# Patient Record
Sex: Female | Born: 1965 | Race: Black or African American | Hispanic: No | Marital: Single | Smoking: Never smoker
Health system: Southern US, Community
[De-identification: ages and names within clinical notes are randomized; demographics above are authoritative.]

## PROBLEM LIST (undated history)

## (undated) DIAGNOSIS — F319 Bipolar disorder, unspecified: Secondary | ICD-10-CM

## (undated) DIAGNOSIS — N289 Disorder of kidney and ureter, unspecified: Secondary | ICD-10-CM

## (undated) DIAGNOSIS — J45909 Unspecified asthma, uncomplicated: Secondary | ICD-10-CM

## (undated) DIAGNOSIS — F419 Anxiety disorder, unspecified: Secondary | ICD-10-CM

## (undated) DIAGNOSIS — Q2112 Patent foramen ovale: Secondary | ICD-10-CM

## (undated) DIAGNOSIS — Q211 Atrial septal defect: Secondary | ICD-10-CM

## (undated) DIAGNOSIS — K219 Gastro-esophageal reflux disease without esophagitis: Secondary | ICD-10-CM

## (undated) DIAGNOSIS — M1990 Unspecified osteoarthritis, unspecified site: Secondary | ICD-10-CM

## (undated) DIAGNOSIS — E669 Obesity, unspecified: Secondary | ICD-10-CM

## (undated) DIAGNOSIS — R0600 Dyspnea, unspecified: Secondary | ICD-10-CM

## (undated) DIAGNOSIS — H409 Unspecified glaucoma: Secondary | ICD-10-CM

## (undated) DIAGNOSIS — Z8619 Personal history of other infectious and parasitic diseases: Secondary | ICD-10-CM

## (undated) DIAGNOSIS — F32A Depression, unspecified: Secondary | ICD-10-CM

## (undated) DIAGNOSIS — G4730 Sleep apnea, unspecified: Secondary | ICD-10-CM

## (undated) DIAGNOSIS — R011 Cardiac murmur, unspecified: Secondary | ICD-10-CM

## (undated) DIAGNOSIS — Z8669 Personal history of other diseases of the nervous system and sense organs: Secondary | ICD-10-CM

## (undated) DIAGNOSIS — D649 Anemia, unspecified: Secondary | ICD-10-CM

## (undated) DIAGNOSIS — E559 Vitamin D deficiency, unspecified: Secondary | ICD-10-CM

## (undated) DIAGNOSIS — I10 Essential (primary) hypertension: Secondary | ICD-10-CM

## (undated) DIAGNOSIS — F329 Major depressive disorder, single episode, unspecified: Secondary | ICD-10-CM

## (undated) DIAGNOSIS — E119 Type 2 diabetes mellitus without complications: Secondary | ICD-10-CM

## (undated) HISTORY — DX: Unspecified glaucoma: H40.9

## (undated) HISTORY — DX: Personal history of other infectious and parasitic diseases: Z86.19

## (undated) HISTORY — DX: Sleep apnea, unspecified: G47.30

## (undated) HISTORY — PX: REDUCTION MAMMAPLASTY: SUR839

## (undated) HISTORY — DX: Gastro-esophageal reflux disease without esophagitis: K21.9

## (undated) HISTORY — PX: TONSILLECTOMY: SUR1361

## (undated) HISTORY — DX: Unspecified asthma, uncomplicated: J45.909

## (undated) HISTORY — DX: Vitamin D deficiency, unspecified: E55.9

## (undated) HISTORY — DX: Major depressive disorder, single episode, unspecified: F32.9

## (undated) HISTORY — DX: Type 2 diabetes mellitus without complications: E11.9

## (undated) HISTORY — PX: OTHER SURGICAL HISTORY: SHX169

## (undated) HISTORY — DX: Anxiety disorder, unspecified: F41.9

## (undated) HISTORY — DX: Anemia, unspecified: D64.9

## (undated) HISTORY — PX: WISDOM TOOTH EXTRACTION: SHX21

## (undated) HISTORY — DX: Unspecified osteoarthritis, unspecified site: M19.90

## (undated) HISTORY — DX: Depression, unspecified: F32.A

---
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
## 2004-10-04 HISTORY — PX: PATENT FORAMEN OVALE(PFO) CLOSURE: CATH118300

## 2014-05-10 ENCOUNTER — Encounter (HOSPITAL_COMMUNITY): Payer: Self-pay | Admitting: Emergency Medicine

## 2014-05-10 ENCOUNTER — Emergency Department (HOSPITAL_COMMUNITY)
Admission: EM | Admit: 2014-05-10 | Discharge: 2014-05-10 | Disposition: A | Discharge status: Home / Self Care | Payer: BC Managed Care – PPO | Attending: Emergency Medicine | Admitting: Emergency Medicine

## 2014-05-10 DIAGNOSIS — M545 Low back pain, unspecified: Secondary | ICD-10-CM | POA: Insufficient documentation

## 2014-05-10 DIAGNOSIS — I10 Essential (primary) hypertension: Secondary | ICD-10-CM | POA: Insufficient documentation

## 2014-05-10 DIAGNOSIS — Z8659 Personal history of other mental and behavioral disorders: Secondary | ICD-10-CM | POA: Insufficient documentation

## 2014-05-10 HISTORY — DX: Essential (primary) hypertension: I10

## 2014-05-10 HISTORY — DX: Bipolar disorder, unspecified: F31.9

## 2014-05-10 MED ORDER — HYDROCODONE-ACETAMINOPHEN 5-325 MG PO TABS: 1.0000 | ORAL_TABLET | Freq: Four times a day (QID) | ORAL | Status: DC | PRN

## 2014-05-10 MED ORDER — DIAZEPAM 5 MG PO TABS
5.0000 mg | ORAL_TABLET | Freq: Once | ORAL | Status: AC
  Administered 2014-05-10: 5 mg via ORAL
  Filled 2014-05-10: qty 1

## 2014-05-10 MED ORDER — MELOXICAM 7.5 MG PO TABS: 15.0000 mg | ORAL_TABLET | Freq: Every day | ORAL | Status: DC

## 2014-05-10 MED ORDER — METHOCARBAMOL 500 MG PO TABS: 500.0000 mg | ORAL_TABLET | Freq: Two times a day (BID) | ORAL | Status: DC

## 2014-05-10 MED ORDER — IBUPROFEN 400 MG PO TABS
800.0000 mg | ORAL_TABLET | Freq: Once | ORAL | Status: AC
  Administered 2014-05-10: 800 mg via ORAL
  Filled 2014-05-10: qty 2

## 2014-05-10 MED ORDER — HYDROCODONE-ACETAMINOPHEN 5-325 MG PO TABS
2.0000 | ORAL_TABLET | Freq: Once | ORAL | Status: AC
  Administered 2014-05-10: 2 via ORAL
  Filled 2014-05-10: qty 2

## 2014-05-10 NOTE — ED Provider Notes (Signed)
CSN: [id number]     Arrival date & time 05/10/14  1043 History  This chart was scribed for non-physician practitioner working with No att. providers found by Mercy Moore, ED Scribe. This patient was seen in room TR08C/TR08C and the patient's care was started at 10:58 AM.   No chief complaint on file.    The history is provided by the patient. No language interpreter was used.   HPI Comments: [name] is a 48 y.o. female who presents to the Emergency Department complaining of worsening back pain, onset one week ago.  Patient's pain is exacerbated with movement and deep breathing and alleviated at rest.  Patient currently rates pain at 10/10. Patient reports treatment with ibuprofen and Excedrin, without relief.  Patient is ambulatory.  Denies causative trauma or injury. She denies urinary symptoms, but reports dysuria one day out of the last week. Patient reports history of UTI years ago. Patient shares history of back pain, but never this severe. Patient denies fever, nausea, vomiting, numbness or tingling in extremities, or changes in bladder/ bowel habits. Patient denies history of kidney stones.   Past Medical History  Diagnosis Date  . Hypertension   . Bipolar 1 disorder    Past Surgical History  Procedure Laterality Date  . Heart defect surgery    . Tonsillectomy     No family history on file. History  Substance Use Topics  . Smoking status: Never Smoker   . Smokeless tobacco: Not on file  . Alcohol Use: Yes   OB History   Grav Para Term Preterm Abortions TAB SAB Ect Mult Living                 Review of Systems  Constitutional: Negative for fever and chills.  Gastrointestinal: Negative for nausea, vomiting, abdominal pain, diarrhea and constipation.  Genitourinary: Negative for dysuria, urgency, frequency, hematuria and enuresis.  Musculoskeletal: Positive for back pain.  Neurological: Negative for weakness and numbness.      Allergies  Review of patient's  allergies indicates no known allergies.  Home Medications   Prior to Admission medications   Medication Sig Start Date End Date Taking? Authorizing Provider  HYDROcodone-acetaminophen (NORCO/VICODIN) 5-325 MG per tablet Take 1-2 tablets by mouth every 6 (six) hours as needed for moderate pain or severe pain. 05/10/14   Noland Fordyce, PA-C  meloxicam (MOBIC) 7.5 MG tablet Take 2 tablets (15 mg total) by mouth daily. 05/10/14   Noland Fordyce, PA-C  methocarbamol (ROBAXIN) 500 MG tablet Take 1 tablet (500 mg total) by mouth 2 (two) times daily. 05/10/14   Noland Fordyce, PA-C   Triage Vitals: BP 155/89  Pulse 94  Temp(Src) 97.9 F (36.6 C) (Oral)  SpO2 99%  LMP 05/05/2014  Physical Exam  Nursing note and vitals reviewed. Constitutional: She is oriented to person, place, and time. She appears well-developed and well-nourished.  Morbidly obese female sitting in exam chair. Appears mildly uncomfortable.   HENT:  Head: Normocephalic and atraumatic.  Eyes: EOM are normal.  Neck: Neck supple.  Cardiovascular: Normal rate.   Pulmonary/Chest: Effort normal.  Musculoskeletal: Normal range of motion.  No midline spinal tenderness. Tenderness along right thoracic and lumbar paraspinal muscles. FROM of all extremities.   Neurological: She is alert and oriented to person, place, and time.  Skin: Skin is warm and dry.  Psychiatric: She has a normal mood and affect. Her behavior is normal.    ED Course  Procedures (including critical care time)  COORDINATION OF  CARE: 11:05 AM- Discussed treatment plan with patient at bedside and patient agreed to plan.   Labs Review Labs Reviewed - No data to display  Imaging Review No results found.   EKG Interpretation None      MDM   Final diagnoses:  Right-sided low back pain without sciatica    pt presenting to ED with c/o right sided back pain, tender along musculature. No midline spinal tenderness. Pt stated she thinks she may have a UTI,  however, denies fever, n/v/d, abdominal pain, dysuria, hematuria, frequency, urgency, or hx ox renal stones.  Pain is worse with movement and palpation. Consistent with muscular pain.    No neurological deficits. Patient is ambulatory. No warning symptoms of back pain including: loss of bowel control, no urinary retention, night sweats, waking from sleep with back pain, unexplained fevers or weight loss, h/o cancer, IVDU, recent trauma. No concern for cauda equina, epidural abscess, or other serious cause of back pain. Conservative measures such as rest, ice/heat and pain medicine indicated with PCP follow-up if no improvement with conservative management.    I personally performed the services described in this documentation, which was scribed in my presence. The recorded information has been reviewed and is accurate.    Noland Fordyce, PA-C 05/10/14 1325

## 2014-05-10 NOTE — ED Provider Notes (Signed)
Medical screening examination/treatment/procedure(s) were performed by non-physician practitioner and as supervising physician I was immediately available for consultation/collaboration.   EKG Interpretation None        Malvin Johns, MD 05/10/14 [phone]

## 2014-05-10 NOTE — ED Notes (Signed)
Pt started 1 week ago with bilateral back pain. No known injury. "I think I have a UTI". Denies dysuria.

## 2014-10-18 ENCOUNTER — Emergency Department (HOSPITAL_COMMUNITY)
Admission: EM | Admit: 2014-10-18 | Discharge: 2014-10-18 | Disposition: A | Discharge status: Home / Self Care | Payer: BLUE CROSS/BLUE SHIELD | Source: Home / Self Care | Attending: Family Medicine | Admitting: Family Medicine

## 2014-10-18 ENCOUNTER — Encounter (HOSPITAL_COMMUNITY): Payer: Self-pay | Admitting: Emergency Medicine

## 2014-10-18 DIAGNOSIS — J3489 Other specified disorders of nose and nasal sinuses: Secondary | ICD-10-CM

## 2014-10-18 DIAGNOSIS — H6092 Unspecified otitis externa, left ear: Secondary | ICD-10-CM | POA: Diagnosis not present

## 2014-10-18 MED ORDER — NEOMYCIN-COLIST-HC-THONZONIUM 3.3-3-10-0.5 MG/ML OT SUSP: 3.0000 [drp] | Freq: Four times a day (QID) | OTIC | Status: DC

## 2014-10-18 MED ORDER — IPRATROPIUM BROMIDE 0.06 % NA SOLN: 2.0000 | Freq: Four times a day (QID) | NASAL | Status: DC

## 2014-10-18 NOTE — ED Notes (Signed)
Ear pain for 2 weeks.  Pain is somewhat better this week, but continues.

## 2014-10-18 NOTE — ED Provider Notes (Signed)
[name] is a 49 y.o. female who presents to Urgent Care today for left ear pain. She has a 2 week history of left ear pain with discharge. He is a one-week history of cough congestion and runny nose. The pain is moderate. She notes decreased hearing. No fevers or chills vomiting or diarrhea. She has tried some over-the-counter eardrops which have not helped much.   Past Medical History  Diagnosis Date  . Hypertension   . Bipolar 1 disorder    Past Surgical History  Procedure Laterality Date  . Heart defect surgery    . Tonsillectomy     History  Substance Use Topics  . Smoking status: Never Smoker   . Smokeless tobacco: Not on file  . Alcohol Use: Yes   ROS as above Medications: No current facility-administered medications for this encounter.   Current Outpatient Prescriptions  Medication Sig Dispense Refill  . ipratropium (ATROVENT) 0.06 % nasal spray Place 2 sprays into both nostrils 4 (four) times daily. 15 mL 1  . neomycin-colistin-hydrocortisone-thonzonium (CORTISPORIN-TC) 3.12-04-08-0.5 MG/ML otic suspension Place 3 drops into the left ear 4 (four) times daily. 10 mL 0   No Known Allergies   Exam:  BP 121/69 mmHg  Pulse 110  Temp(Src) 98.2 F (36.8 C) (Oral)  Resp 20  SpO2 98% Gen: Well NAD HEENT: EOMI,  MMM left ear tender with motion. The ear canal is inflamed with discharge the tympanic membranes normal-appearing. The mastoids are nontender bilaterally. The right ear is normal-appearing. Patient has clear nasal discharge Lungs: Normal work of breathing. CTABL Heart: Mild tachycardia but regular no MRG Abd: NABS, Soft. Nondistended, Nontender Exts: Brisk capillary refill, warm and well perfused.   No results found for this or any previous visit (from the past 24 hour(s)). No results found.  Assessment and Plan: 49 y.o. female with otitis externa and rhinorrhea. Treat with Cortisporin drops and Atrovent nasal spray. Watchful waiting.  Discussed warning  signs or symptoms. Please see discharge instructions. Patient expresses understanding.     Gregor Hams, MD 10/18/14 [phone]

## 2014-10-18 NOTE — Discharge Instructions (Signed)
Thank you for coming in today. ° °Otitis Externa °Otitis externa is a bacterial or fungal infection of the outer ear canal. This is the area from the eardrum to the outside of the ear. Otitis externa is sometimes called "swimmer's ear." °CAUSES  °Possible causes of infection include: °· Swimming in dirty water. °· Moisture remaining in the ear after swimming or bathing. °· Mild injury (trauma) to the ear. °· Objects stuck in the ear (foreign body). °· Cuts or scrapes (abrasions) on the outside of the ear. °SIGNS AND SYMPTOMS  °The first symptom of infection is often itching in the ear canal. Later signs and symptoms may include swelling and redness of the ear canal, ear pain, and yellowish-white fluid (pus) coming from the ear. The ear pain may be worse when pulling on the earlobe. °DIAGNOSIS  °Your health care provider will perform a physical exam. A sample of fluid may be taken from the ear and examined for bacteria or fungi. °TREATMENT  °Antibiotic ear drops are often given for 10 to 14 days. Treatment may also include pain medicine or corticosteroids to reduce itching and swelling. °HOME CARE INSTRUCTIONS  °· Apply antibiotic ear drops to the ear canal as prescribed by your health care provider. °· Take medicines only as directed by your health care provider. °· If you have diabetes, follow any additional treatment instructions from your health care provider. °· Keep all follow-up visits as directed by your health care provider. °PREVENTION  °· Keep your ear dry. Use the corner of a towel to absorb water out of the ear canal after swimming or bathing. °· Avoid scratching or putting objects inside your ear. This can damage the ear canal or remove the protective wax that lines the canal. This makes it easier for bacteria and fungi to grow. °· Avoid swimming in lakes, polluted water, or poorly chlorinated pools. °· You may use ear drops made of rubbing alcohol and vinegar after swimming. Combine equal parts of  white vinegar and alcohol in a bottle. Put 3 or 4 drops into each ear after swimming. °SEEK MEDICAL CARE IF:  °· You have a fever. °· Your ear is still red, swollen, painful, or draining pus after 3 days. °· Your redness, swelling, or pain gets worse. °· You have a severe headache. °· You have redness, swelling, pain, or tenderness in the area behind your ear. °MAKE SURE YOU:  °· Understand these instructions. °· Will watch your condition. °· Will get help right away if you are not doing well or get worse. °Document Released: 09/20/2005 Document Revised: 02/04/2014 Document Reviewed: 10/07/2011 °Exit[employer]® Patient Information ©2015 [employer]. This information is not intended to replace advice given to you by your health care provider. Make sure you discuss any questions you have with your health care provider. ° °

## 2014-10-24 ENCOUNTER — Telehealth (HOSPITAL_COMMUNITY): Payer: Self-pay | Admitting: *Deleted

## 2014-10-24 NOTE — ED Notes (Signed)
Pt. called on VM and said the antibiotic ear drop is not covered by her insurance.  I called pt. back and she said the Rx. is $186.00 and her insurance requires a prior authorization.  I told her we don't do prior authorizations.  I told her we are not open until Sunday or Monday but I would send a message to Dr. Georgina Snell and ask him.  It could be because it is Cortisporin -TC.  I told her Cortisporin is usually the least expensive of the ear drops. I told she could go to the ED and see if they can give her one out of the [facility] or from the pharmacy.  Message sent to Dr. Georgina Snell.

## 2014-12-09 LAB — CBC AND DIFFERENTIAL
HCT: 34 — AB (ref 36–46)
Hemoglobin: 10.6 — AB (ref 12.0–16.0)
PLATELETS: 370 (ref 150–399)
WBC: 7.1

## 2014-12-09 LAB — HEPATIC FUNCTION PANEL
ALK PHOS: 76 (ref 25–125)
ALT: 11 (ref 7–35)
AST: 13 (ref 13–35)
Bilirubin, Total: 0.5

## 2014-12-09 LAB — LIPID PANEL
CHOLESTEROL: 137 (ref 0–200)
HDL: 39 (ref 35–70)
LDL Cholesterol: 76
Triglycerides: 109 (ref 40–160)

## 2014-12-09 LAB — BASIC METABOLIC PANEL
BUN: 10 (ref 4–21)
CREATININE: 0.7 (ref 0.5–1.1)
Glucose: 84
Potassium: 4.3 (ref 3.4–5.3)
Sodium: 132 — AB (ref 137–147)

## 2014-12-09 LAB — TSH: TSH: 1.4 (ref 0.41–5.90)

## 2014-12-18 ENCOUNTER — Other Ambulatory Visit: Payer: Self-pay

## 2014-12-18 DIAGNOSIS — Z1231 Encounter for screening mammogram for malignant neoplasm of breast: Secondary | ICD-10-CM

## 2014-12-20 ENCOUNTER — Ambulatory Visit: Payer: BLUE CROSS/BLUE SHIELD

## 2015-01-08 ENCOUNTER — Ambulatory Visit
Admission: RE | Admit: 2015-01-08 | Discharge: 2015-01-08 | Disposition: A | Discharge status: Home / Self Care | Payer: Medicare Other | Source: Ambulatory Visit

## 2015-01-08 DIAGNOSIS — Z1231 Encounter for screening mammogram for malignant neoplasm of breast: Secondary | ICD-10-CM

## 2015-10-10 LAB — HM PAP SMEAR

## 2016-01-09 LAB — BASIC METABOLIC PANEL
BUN: 10 (ref 4–21)
Creatinine: 0.7 (ref 0.5–1.1)
GLUCOSE: 92
Potassium: 4.4 (ref 3.4–5.3)
SODIUM: 137 (ref 137–147)

## 2016-01-09 LAB — LIPID PANEL
Cholesterol: 129 (ref 0–200)
HDL: 43 (ref 35–70)
LDL Cholesterol: 66
Triglycerides: 99 (ref 40–160)

## 2016-01-09 LAB — CBC AND DIFFERENTIAL
HEMATOCRIT: 32 — AB (ref 36–46)
Hemoglobin: 10 — AB (ref 12.0–16.0)
NEUTROS ABS: 3819
Platelets: 342 (ref 150–399)
WBC: 5.7

## 2016-01-09 LAB — HEPATIC FUNCTION PANEL
ALT: 21 (ref 7–35)
AST: 20 (ref 13–35)
Alkaline Phosphatase: 61 (ref 25–125)
Bilirubin, Total: 0.4

## 2016-01-09 LAB — TSH: TSH: 1.42 (ref 0.41–5.90)

## 2016-01-12 ENCOUNTER — Other Ambulatory Visit: Payer: Self-pay | Admitting: Internal Medicine

## 2016-01-12 DIAGNOSIS — Z1231 Encounter for screening mammogram for malignant neoplasm of breast: Secondary | ICD-10-CM

## 2016-02-05 ENCOUNTER — Ambulatory Visit: Payer: Medicare Other

## 2017-03-15 DIAGNOSIS — F3181 Bipolar II disorder: Secondary | ICD-10-CM | POA: Diagnosis not present

## 2017-03-31 ENCOUNTER — Encounter: Payer: Self-pay | Admitting: Physician Assistant

## 2017-03-31 ENCOUNTER — Ambulatory Visit (INDEPENDENT_AMBULATORY_CARE_PROVIDER_SITE_OTHER): Payer: Medicare Other | Admitting: Physician Assistant

## 2017-03-31 VITALS — BP 140/100 | HR 103 | Temp 98.5°F | Ht 60.0 in | Wt 312.0 lb

## 2017-03-31 DIAGNOSIS — G4730 Sleep apnea, unspecified: Secondary | ICD-10-CM | POA: Insufficient documentation

## 2017-03-31 DIAGNOSIS — I10 Essential (primary) hypertension: Secondary | ICD-10-CM | POA: Diagnosis not present

## 2017-03-31 DIAGNOSIS — G4733 Obstructive sleep apnea (adult) (pediatric): Secondary | ICD-10-CM | POA: Diagnosis not present

## 2017-03-31 DIAGNOSIS — F319 Bipolar disorder, unspecified: Secondary | ICD-10-CM | POA: Insufficient documentation

## 2017-03-31 MED ORDER — METOPROLOL SUCCINATE ER 50 MG PO TB24: 50.0000 mg | ORAL_TABLET | Freq: Every day | ORAL | 0 refills | Status: DC

## 2017-03-31 NOTE — Patient Instructions (Addendum)
It was great meeting you, welcome to [facility]!!

## 2017-03-31 NOTE — Progress Notes (Signed)
[name] is a 51 y.o. female here to [facility] and discuss elevated blood pressure.  I acted as a Education administrator for [employer], PA-C Dana Pickler, LPN  History of Present Illness:   Chief Complaint  Patient presents with  . Establish Care    Medicare  . Hypertension    Ran out of Medication in Feb.    HTN -- has had HTN x 3 years, out of medication since Feb, was on Metoprolol 50 mg, some increased blurriness in vision, very mild leg swelling and dull headaches since being off of her medication, not great with following reduced salt intake, does drink caffeinated sodas - 1 x 20 oz daily, EKG done in 2017 was normal, denies chest pain, SOB; she does have a diagnosis of OSA and has a CPAP but she refuses to wear it and does not want a repeat sleep study at this time  Health Maintenance: Sleep habits -- uses the Doxepin to help with sleeping, 6-7 hours of sleeping, does snore and does not use CPAP Weight -- Weight: (!) 312 lb (141.5 kg)  Mood -- feels "okay"  Depression screen PHQ 2/9 03/31/2017  Decreased Interest 2  Down, Depressed, Hopeless 1  PHQ - 2 Score 3  Altered sleeping 0  Tired, decreased energy 2  Change in appetite 1  Feeling bad or failure about yourself  1  Trouble concentrating 1  Moving slowly or fidgety/restless 1  Suicidal thoughts 1  PHQ-9 Score 10    No flowsheet data found.  Other providers/specialists: [name] -- psych PA-C [name] -- counselor   Past Medical History:  Diagnosis Date  . Bipolar 1 disorder ([place])   . History of chicken pox   . Hypertension      Social History   Social History  . Marital status: Single    Spouse name: N/A  . Number of children: N/A  . Years of education: N/A   Occupational History  . Not on file.   Social History Main Topics  . Smoking status: Never Smoker  . Smokeless tobacco: Never Used  . Alcohol use 0.6 - 1.2 oz/week    1 - 2 Glasses of wine per week  . Drug use: No  . Sexual  activity: No   Other Topics Concern  . Not on file   Social History Narrative   Lives in [place], was living in [place], [place] for 20 years   No children, not married   Single   Family is nearby, mom and sister nearby   Currently on disability: 4 years ago she had an episode of psychosis (states that she didn't sleep for ~6-8 days prior to) and was in and out of mental institution for 6 months, lost her job and had to move back to [place] to be near family    Past Surgical History:  Procedure Laterality Date  . BREAST REDUCTION SURGERY  1986  . Heart defect surgery    . TONSILLECTOMY      Family History  Problem Relation Age of Onset  . Diabetes Mother   . Hypertension Mother   . Stroke Father   . Diabetes Maternal Grandmother   . CVA Maternal Grandmother   . Diabetes Maternal Grandfather   . Colon cancer Paternal Grandmother   . Diabetes Paternal Grandfather   . Breast cancer Maternal Aunt     No Known Allergies   Current Medications:   Current Outpatient Prescriptions:  .  ARIPiprazole (ABILIFY) 5  MG tablet, TAKE 1 TABELET BY MOUTH AT BEDTIME, Disp: , Rfl: 1 .  doxepin (SINEQUAN) 25 MG capsule, TAKE 1 TO 2 CAPSULES AT BEDTIME, Disp: , Rfl: 1 .  metoprolol succinate (TOPROL-XL) 50 MG 24 hr tablet, Take 1 tablet (50 mg total) by mouth daily. Take with or immediately following a meal., Disp: 90 tablet, Rfl: 0   Review of Systems:   Review of Systems  Constitutional: Negative for chills, fever, malaise/fatigue and weight loss.  HENT: Negative for hearing loss, sinus pain and sore throat.   Eyes: Negative for blurred vision.  Respiratory: Negative for cough and shortness of breath.   Cardiovascular: Positive for leg swelling. Negative for chest pain and palpitations.  Gastrointestinal: Negative for abdominal pain, constipation, diarrhea, heartburn, nausea and vomiting.  Genitourinary: Negative for dysuria, frequency and urgency.  Musculoskeletal: Negative for back pain,  myalgias and neck pain.  Skin: Negative for itching and rash.  Neurological: Positive for headaches. Negative for dizziness, tingling, seizures and loss of consciousness.  Endo/Heme/Allergies: Negative for polydipsia.  Psychiatric/Behavioral: Positive for depression. The patient is nervous/anxious.        Sees Therapists.    Vitals:   Vitals:   03/31/17 0942  BP: (!) 140/100  Pulse: (!) 103  Temp: 98.5 F (36.9 C)  TempSrc: Oral  SpO2: 96%  Weight: (!) 312 lb (141.5 kg)  Height: 5' (1.524 m)     Body mass index is 60.93 kg/m.  Physical Exam:   Physical Exam  Constitutional: She appears well-developed. She is cooperative.  Non-toxic appearance. She does not have a sickly appearance. She does not appear ill. No distress.  Cardiovascular: Normal rate, regular rhythm, S1 normal, S2 normal, normal heart sounds and normal pulses.   No LE edema  Pulmonary/Chest: Effort normal and breath sounds normal.  Neurological: She is alert.  Psychiatric: She has a normal mood and affect. Her speech is normal and behavior is normal. Thought content normal.  Pleasant and cheerful  Nursing note and vitals reviewed.     Assessment and Plan:    [name] was seen today for establish care and hypertension.  Diagnoses and all orders for this visit:  Hypertension, unspecified type Currently uncontrolled. Resume Toprol XL 50 mg daily tablet. Follow-up in 3 months for physical, sooner if needed.   Obstructive Sleep Apnea Declines repeat sleep study and evaluation for other devices that she may be more compliant with. Discussed risks of uncontrolled sleep apnea. Patient will let us know if she changes her mind.  Other orders -     metoprolol succinate (TOPROL-XL) 50 MG 24 hr tablet; Take 1 tablet (50 mg total) by mouth daily. Take with or immediately following a meal.  . Reviewed expectations re: course of current medical issues. . Discussed self-management of symptoms. . Outlined signs and  symptoms indicating need for more acute intervention. . Patient verbalized understanding and all questions were answered. . See orders for this visit as documented in the electronic medical record. . Patient received an After-Visit Summary.  CMA or LPN served as scribe during this visit. History, Physical, and Plan performed by medical provider. Documentation and orders reviewed and attested to.  [name], PA-C

## 2017-04-04 DIAGNOSIS — F3181 Bipolar II disorder: Secondary | ICD-10-CM | POA: Diagnosis not present

## 2017-04-14 ENCOUNTER — Telehealth: Payer: Self-pay | Admitting: Physician Assistant

## 2017-04-14 NOTE — Telephone Encounter (Signed)
ROI faxed to [facility]

## 2017-05-05 NOTE — Telephone Encounter (Signed)
Rec'd from [facility] forwarded 23 pages to [facility]

## 2017-05-09 ENCOUNTER — Encounter: Payer: Medicare Other | Admitting: Physician Assistant

## 2017-05-12 ENCOUNTER — Encounter: Payer: Self-pay | Admitting: Physician Assistant

## 2017-05-12 LAB — NEISSERIA GONORRHOEAE, PROBE AMP: Neisseria Gonorrhoeae RNA: NOT DETECTED

## 2017-05-12 LAB — ESTIMATED GFR

## 2017-05-25 ENCOUNTER — Ambulatory Visit (INDEPENDENT_AMBULATORY_CARE_PROVIDER_SITE_OTHER): Payer: PPO | Admitting: Physician Assistant

## 2017-05-25 ENCOUNTER — Encounter: Payer: Self-pay | Admitting: Physician Assistant

## 2017-05-25 VITALS — BP 140/90 | HR 95 | Ht 60.0 in | Wt 314.6 lb

## 2017-05-25 DIAGNOSIS — R350 Frequency of micturition: Secondary | ICD-10-CM | POA: Diagnosis not present

## 2017-05-25 DIAGNOSIS — Z833 Family history of diabetes mellitus: Secondary | ICD-10-CM | POA: Diagnosis not present

## 2017-05-25 DIAGNOSIS — K5900 Constipation, unspecified: Secondary | ICD-10-CM

## 2017-05-25 DIAGNOSIS — F319 Bipolar disorder, unspecified: Secondary | ICD-10-CM

## 2017-05-25 DIAGNOSIS — I10 Essential (primary) hypertension: Secondary | ICD-10-CM

## 2017-05-25 DIAGNOSIS — Z13220 Encounter for screening for lipoid disorders: Secondary | ICD-10-CM | POA: Diagnosis not present

## 2017-05-25 DIAGNOSIS — Z136 Encounter for screening for cardiovascular disorders: Secondary | ICD-10-CM | POA: Diagnosis not present

## 2017-05-25 DIAGNOSIS — Z0001 Encounter for general adult medical examination with abnormal findings: Secondary | ICD-10-CM

## 2017-05-25 DIAGNOSIS — N926 Irregular menstruation, unspecified: Secondary | ICD-10-CM

## 2017-05-25 DIAGNOSIS — Z1231 Encounter for screening mammogram for malignant neoplasm of breast: Secondary | ICD-10-CM | POA: Diagnosis not present

## 2017-05-25 LAB — POC URINALSYSI DIPSTICK (AUTOMATED)
BILIRUBIN UA: NEGATIVE
Glucose, UA: NEGATIVE
Ketones, UA: NEGATIVE
Leukocytes, UA: NEGATIVE
NITRITE UA: NEGATIVE
PH UA: 6 (ref 5.0–8.0)
PROTEIN UA: NEGATIVE
RBC UA: NEGATIVE
Spec Grav, UA: 1.015 (ref 1.010–1.025)
UROBILINOGEN UA: 0.2 U/dL

## 2017-05-25 LAB — CBC WITH DIFFERENTIAL/PLATELET
BASOS ABS: 0 10*3/uL (ref 0.0–0.1)
Basophils Relative: 0.1 % (ref 0.0–3.0)
EOS ABS: 0.1 10*3/uL (ref 0.0–0.7)
Eosinophils Relative: 0.9 % (ref 0.0–5.0)
HEMATOCRIT: 34.3 % — AB (ref 36.0–46.0)
Hemoglobin: 10.8 g/dL — ABNORMAL LOW (ref 12.0–15.0)
LYMPHS PCT: 21 % (ref 12.0–46.0)
Lymphs Abs: 1.6 10*3/uL (ref 0.7–4.0)
MCHC: 31.5 g/dL (ref 30.0–36.0)
MCV: 82.7 fl (ref 78.0–100.0)
Monocytes Absolute: 0.5 10*3/uL (ref 0.1–1.0)
Monocytes Relative: 6.7 % (ref 3.0–12.0)
Neutro Abs: 5.3 10*3/uL (ref 1.4–7.7)
Neutrophils Relative %: 71.3 % (ref 43.0–77.0)
PLATELETS: 384 10*3/uL (ref 150.0–400.0)
RBC: 4.15 Mil/uL (ref 3.87–5.11)
RDW: 17.1 % — ABNORMAL HIGH (ref 11.5–15.5)
WBC: 7.4 10*3/uL (ref 4.0–10.5)

## 2017-05-25 LAB — COMPREHENSIVE METABOLIC PANEL
ALT: 20 U/L (ref 0–35)
AST: 19 U/L (ref 0–37)
Albumin: 4 g/dL (ref 3.5–5.2)
Alkaline Phosphatase: 85 U/L (ref 39–117)
BILIRUBIN TOTAL: 0.5 mg/dL (ref 0.2–1.2)
BUN: 11 mg/dL (ref 6–23)
CALCIUM: 9.5 mg/dL (ref 8.4–10.5)
CHLORIDE: 99 meq/L (ref 96–112)
CO2: 27 mEq/L (ref 19–32)
Creatinine, Ser: 0.67 mg/dL (ref 0.40–1.20)
GFR: 120.11 mL/min (ref >60.00)
Glucose, Bld: 110 mg/dL — ABNORMAL HIGH (ref 70–99)
Potassium: 4.1 mEq/L (ref 3.5–5.1)
Sodium: 135 mEq/L (ref 135–145)
Total Protein: 8.7 g/dL — ABNORMAL HIGH (ref 6.0–8.3)

## 2017-05-25 LAB — LIPID PANEL
Cholesterol: 178 mg/dL (ref 0–200)
HDL: 50.7 mg/dL (ref >39.00)
LDL Cholesterol: 104 mg/dL — ABNORMAL HIGH (ref 0–99)
NonHDL: 127.14
Total CHOL/HDL Ratio: 4
Triglycerides: 117 mg/dL (ref 0.0–149.0)
VLDL: 23.4 mg/dL (ref 0.0–40.0)

## 2017-05-25 LAB — VITAMIN D 25 HYDROXY (VIT D DEFICIENCY, FRACTURES): VITD: 6.01 ng/mL — AB (ref 30.00–100.00)

## 2017-05-25 LAB — POCT URINE PREGNANCY: Preg Test, Ur: NEGATIVE

## 2017-05-25 LAB — TSH: TSH: 1.91 u[IU]/mL (ref 0.35–4.50)

## 2017-05-25 LAB — HEMOGLOBIN A1C: HEMOGLOBIN A1C: 6.1 % (ref 4.6–6.5)

## 2017-05-25 NOTE — Patient Instructions (Signed)
It was great to see you!  You will be contacted about your mammogram and referral to [place] (weight loss doctor).  Please work on bowel regimen -- recommend daily Miralax - 1 capful, goal is 1 soft bowel movement daily.  If urinary symptoms persist or if you develop fever, please let us know.  Please make an appointment with ob-gyn.  Health Maintenance, Female Adopting a healthy lifestyle and getting preventive care can go a long way to promote health and wellness. Talk with your health care provider about what schedule of regular examinations is right for you. This is a good chance for you to check in with your provider about disease prevention and staying healthy. In between checkups, there are plenty of things you can do on your own. Experts have done a lot of research about which lifestyle changes and preventive measures are most likely to keep you healthy. Ask your health care provider for more information. Weight and diet Eat a healthy diet  Be sure to include plenty of vegetables, fruits, low-fat dairy products, and lean protein.  Do not eat a lot of foods high in solid fats, added sugars, or salt.  Get regular exercise. This is one of the most important things you can do for your health. ? Most adults should exercise for at least 150 minutes each week. The exercise should increase your heart rate and make you sweat (moderate-intensity exercise). ? Most adults should also do strengthening exercises at least twice a week. This is in addition to the moderate-intensity exercise.  Maintain a healthy weight  Body mass index (BMI) is a measurement that can be used to identify possible weight problems. It estimates body fat based on height and weight. Your health care provider can help determine your BMI and help you achieve or maintain a healthy weight.  For females 30 years of age and older: ? A BMI below 18.5 is considered underweight. ? A BMI of 18.5 to 24.9 is normal. ? A  BMI of 25 to 29.9 is considered overweight. ? A BMI of 30 and above is considered obese.  Watch levels of cholesterol and blood lipids  You should start having your blood tested for lipids and cholesterol at 49 years of age, then have this test every 5 years.  You may need to have your cholesterol levels checked more often if: ? Your lipid or cholesterol levels are high. ? You are older than 49 years of age. ? You are at high risk for heart disease.  Cancer screening Lung Cancer  Lung cancer screening is recommended for adults 50-21 years old who are at high risk for lung cancer because of a history of smoking.  A yearly low-dose CT scan of the lungs is recommended for people who: ? Currently smoke. ? Have quit within the past 15 years. ? Have at least a 30-pack-year history of smoking. A pack year is smoking an average of one pack of cigarettes a day for 1 year.  Yearly screening should continue until it has been 15 years since you quit.  Yearly screening should stop if you develop a health problem that would prevent you from having lung cancer treatment.  Breast Cancer  Practice breast self-awareness. This means understanding how your breasts normally appear and feel.  It also means doing regular breast self-exams. Let your health care provider know about any changes, no matter how small.  If you are in your 20s or 30s, you should have a clinical breast  exam (CBE) by a health care provider every 1-3 years as part of a regular health exam.  If you are 20 or older, have a CBE every year. Also consider having a breast X-ray (mammogram) every year.  If you have a family history of breast cancer, talk to your health care provider about genetic screening.  If you are at high risk for breast cancer, talk to your health care provider about having an MRI and a mammogram every year.  Breast cancer gene (BRCA) assessment is recommended for women who have family members with  BRCA-related cancers. BRCA-related cancers include: ? Breast. ? Ovarian. ? Tubal. ? Peritoneal cancers.  Results of the assessment will determine the need for genetic counseling and BRCA1 and BRCA2 testing.  Cervical Cancer Your health care provider may recommend that you be screened regularly for cancer of the pelvic organs (ovaries, uterus, and vagina). This screening involves a pelvic examination, including checking for microscopic changes to the surface of your cervix (Pap test). You may be encouraged to have this screening done every 3 years, beginning at age 51.  For women ages 15-65, health care providers may recommend pelvic exams and Pap testing every 3 years, or they may recommend the Pap and pelvic exam, combined with testing for human papilloma virus (HPV), every 5 years. Some types of HPV increase your risk of cervical cancer. Testing for HPV may also be done on women of any age with unclear Pap test results.  Other health care providers may not recommend any screening for nonpregnant women who are considered low risk for pelvic cancer and who do not have symptoms. Ask your health care provider if a screening pelvic exam is right for you.  If you have had past treatment for cervical cancer or a condition that could lead to cancer, you need Pap tests and screening for cancer for at least 20 years after your treatment. If Pap tests have been discontinued, your risk factors (such as having a new sexual partner) need to be reassessed to determine if screening should resume. Some women have medical problems that increase the chance of getting cervical cancer. In these cases, your health care provider may recommend more frequent screening and Pap tests.  Colorectal Cancer  This type of cancer can be detected and often prevented.  Routine colorectal cancer screening usually begins at 49 years of age and continues through 49 years of age.  Your health care provider may recommend screening  at an earlier age if you have risk factors for colon cancer.  Your health care provider may also recommend using home test kits to check for hidden blood in the stool.  A small camera at the end of a tube can be used to examine your colon directly (sigmoidoscopy or colonoscopy). This is done to check for the earliest forms of colorectal cancer.  Routine screening usually begins at age 19.  Direct examination of the colon should be repeated every 5-10 years through 49 years of age. However, you may need to be screened more often if early forms of precancerous polyps or small growths are found.  Skin Cancer  Check your skin from head to toe regularly.  Tell your health care provider about any new moles or changes in moles, especially if there is a change in a mole's shape or color.  Also tell your health care provider if you have a mole that is larger than the size of a pencil eraser.  Always use sunscreen. Apply sunscreen  liberally and repeatedly throughout the day.  Protect yourself by wearing long sleeves, pants, a wide-brimmed hat, and sunglasses whenever you are outside.  Heart disease, diabetes, and high blood pressure  High blood pressure causes heart disease and increases the risk of stroke. High blood pressure is more likely to develop in: ? People who have blood pressure in the high end of the normal range (130-139/85-89 mm Hg). ? People who are overweight or obese. ? People who are African American.  If you are 25-56 years of age, have your blood pressure checked every 3-5 years. If you are 46 years of age or older, have your blood pressure checked every year. You should have your blood pressure measured twice-once when you are at a hospital or clinic, and once when you are not at a hospital or clinic. Record the average of the two measurements. To check your blood pressure when you are not at a hospital or clinic, you can use: ? An automated blood pressure machine at a  pharmacy. ? A home blood pressure monitor.  If you are between 74 years and 77 years old, ask your health care provider if you should take aspirin to prevent strokes.  Have regular diabetes screenings. This involves taking a blood sample to check your fasting blood sugar level. ? If you are at a normal weight and have a low risk for diabetes, have this test once every three years after 49 years of age. ? If you are overweight and have a high risk for diabetes, consider being tested at a younger age or more often. Preventing infection Hepatitis B  If you have a higher risk for hepatitis B, you should be screened for this virus. You are considered at high risk for hepatitis B if: ? You were born in a country where hepatitis B is common. Ask your health care provider which countries are considered high risk. ? Your parents were born in a high-risk country, and you have not been immunized against hepatitis B (hepatitis B vaccine). ? You have HIV or AIDS. ? You use needles to inject street drugs. ? You live with someone who has hepatitis B. ? You have had sex with someone who has hepatitis B. ? You get hemodialysis treatment. ? You take certain medicines for conditions, including cancer, organ transplantation, and autoimmune conditions.  Hepatitis C  Blood testing is recommended for: ? Everyone born from [birth year] through 1963. ? Anyone with known risk factors for hepatitis C.  Sexually transmitted infections (STIs)  You should be screened for sexually transmitted infections (STIs) including gonorrhea and chlamydia if: ? You are sexually active and are younger than 49 years of age. ? You are older than 49 years of age and your health care provider tells you that you are at risk for this type of infection. ? Your sexual activity has changed since you were last screened and you are at an increased risk for chlamydia or gonorrhea. Ask your health care provider if you are at risk.  If you do not  have HIV, but are at risk, it may be recommended that you take a prescription medicine daily to prevent HIV infection. This is called pre-exposure prophylaxis (PrEP). You are considered at risk if: ? You are sexually active and do not regularly use condoms or know the HIV status of your partner(s). ? You take drugs by injection. ? You are sexually active with a partner who has HIV.  Talk with your health care provider about  whether you are at high risk of being infected with HIV. If you choose to begin PrEP, you should first be tested for HIV. You should then be tested every 3 months for as long as you are taking PrEP. Pregnancy  If you are premenopausal and you may become pregnant, ask your health care provider about preconception counseling.  If you may become pregnant, take 400 to 800 micrograms (mcg) of folic acid every day.  If you want to prevent pregnancy, talk to your health care provider about birth control (contraception). Osteoporosis and menopause  Osteoporosis is a disease in which the bones lose minerals and strength with aging. This can result in serious bone fractures. Your risk for osteoporosis can be identified using a bone density scan.  If you are 51 years of age or older, or if you are at risk for osteoporosis and fractures, ask your health care provider if you should be screened.  Ask your health care provider whether you should take a calcium or vitamin D supplement to lower your risk for osteoporosis.  Menopause may have certain physical symptoms and risks.  Hormone replacement therapy may reduce some of these symptoms and risks. Talk to your health care provider about whether hormone replacement therapy is right for you. Follow these instructions at home:  Schedule regular health, dental, and eye exams.  Stay current with your immunizations.  Do not use any tobacco products including cigarettes, chewing tobacco, or electronic cigarettes.  If you are pregnant,  do not drink alcohol.  If you are breastfeeding, limit how much and how often you drink alcohol.  Limit alcohol intake to no more than 1 drink per day for nonpregnant women. One drink equals 12 ounces of beer, 5 ounces of wine, or 1 ounces of hard liquor.  Do not use street drugs.  Do not share needles.  Ask your health care provider for help if you need support or information about quitting drugs.  Tell your health care provider if you often feel depressed.  Tell your health care provider if you have ever been abused or do not feel safe at home. This information is not intended to replace advice given to you by your health care provider. Make sure you discuss any questions you have with your health care provider. Document Released: 04/05/2011 Document Revised: 02/26/2016 Document Reviewed: 06/24/2015 Elsevier Interactive Patient Education  [employer].

## 2017-05-25 NOTE — Progress Notes (Signed)
[name], cma is acting as a Education administrator for [employer], [place].   Subjective:    [name] is a 51 y.o. female and is here for a comprehensive physical exam.   HPI  Health Maintenance Due  Topic Date Due  . INFLUENZA VACCINE  05/04/2017    Acute Concerns: Urinary frequency -- C/o urinary frequency, low back pain, and odor. No dysuria or hematuria. Onset x2 months ago. No OTC medications for sx. HX of UTI several years ago. Does not feel like she is emptying her bladder all of the way. Having bloody discharge x 3-4 days. Periods are irregular. Not sexually active. She has been putting off seeing an Ob-Gyn but is agreeable to this   Chronic Issues: Morbid obesity -- Body mass index is 61.44 kg/m.  Most she lost at one time was 20 lb with [employer], has tried several fad diets, easily gets burned out with diets, has sought out weight loss surgery -- back in 1990s and was told she wasn't heavy enough, is interested in this now. Has friends who have undergone weight loss surgery and some have been successful. Has tried diet sodas to help with weight loss. She does have a family hx of DM and would like to be screened for this today, denies polyuria, polyphagia or blurred vision. Bipolar disorder -- was on latuda but was very expensive, but was switched to abilify due to cost and has been on this for two months HTN -- 1-2 days a week will take her metoprolol during the week, she likes to take it with food and doesn't always eat breakfast. She doesn't have a blood pressure cuff. Denies chest pain, SOB, lower leg swelling. Constipation -- may be weeks until she goes, last month used dulcolax, had to take 4 in order for her be "completely cleared out." Small BM yesterday that was hard and unsatisfying. Denies blood in stool. Dietary recall with little fiber.  Health Maintenance: Immunizations -- declined flu shot Colonoscopy -- due next year Mammogram -- agreeable today, will  order PAP -- records requested, declined today Diet --  Beverages -- juice, sodas Breakfast -- often skips Lunch -- often skips Dinner -- oven fried chicken and pork chops, vegetables, rice/pasta/potatoes Exercise -- none Weight -- Weight: (!) 314 lb 9.6 oz (142.7 kg)   Depression screen PHQ 2/9 03/31/2017  Decreased Interest 2  Down, Depressed, Hopeless 1  PHQ - 2 Score 3  Altered sleeping 0  Tired, decreased energy 2  Change in appetite 1  Feeling bad or failure about yourself  1  Trouble concentrating 1  Moving slowly or fidgety/restless 1  Suicidal thoughts 1  PHQ-9 Score 10   Other providers/specialists: Jobie Quaker -- [facility] [place]   PMHx, SurgHx, SocialHx, Medications, and Allergies were reviewed in the Visit Navigator and updated as appropriate.   Past Medical History:  Diagnosis Date  . Bipolar 1 disorder ([place])   . History of chicken pox   . Hypertension      Past Surgical History:  Procedure Laterality Date  . BREAST REDUCTION SURGERY  1986  . Heart defect surgery    . TONSILLECTOMY       Family History  Problem Relation Age of Onset  . Diabetes Mother   . Hypertension Mother   . Stroke Father   . Diabetes Maternal Grandmother   . CVA Maternal Grandmother   . Diabetes Maternal Grandfather   .  Colon cancer Paternal Grandmother   . Diabetes Paternal Grandfather   . Breast cancer Maternal Aunt     Social History  Substance Use Topics  . Smoking status: Never Smoker  . Smokeless tobacco: Never Used  . Alcohol use 0.6 - 1.2 oz/week    1 - 2 Glasses of wine per week    Review of Systems:   Review of Systems  Constitutional: Negative for chills, diaphoresis, fever, malaise/fatigue and weight loss.  HENT: Negative for congestion, ear discharge, ear pain, hearing loss, nosebleeds, sinus pain, sore throat and tinnitus.   Eyes: Negative for blurred vision, double vision,  photophobia, pain, discharge and redness.  Respiratory: Positive for shortness of breath. Negative for cough, hemoptysis, sputum production, wheezing and stridor.   Cardiovascular: Negative for chest pain, palpitations, orthopnea, claudication, leg swelling and PND.  Gastrointestinal: Positive for constipation. Negative for abdominal pain, blood in stool, diarrhea, heartburn, melena, nausea and vomiting.  Genitourinary: Positive for flank pain, frequency and urgency. Negative for dysuria and hematuria.  Musculoskeletal: Positive for back pain. Negative for falls, joint pain, myalgias and neck pain.  Skin: Negative for itching and rash.  Neurological: Positive for headaches. Negative for dizziness, tingling, tremors, sensory change, speech change, focal weakness, seizures, loss of consciousness and weakness.  Endo/Heme/Allergies: Negative for environmental allergies and polydipsia. Does not bruise/bleed easily.  Psychiatric/Behavioral: Negative for depression, hallucinations, memory loss, substance abuse and suicidal ideas. The patient is not nervous/anxious and does not have insomnia.     Objective:   BP 140/90   Pulse 95   Ht 5' (1.524 m)   Wt (!) 314 lb 9.6 oz (142.7 kg)   LMP 04/24/2017 (Approximate)   SpO2 98%   BMI 61.44 kg/m  Body mass index is 61.44 kg/m.   General Appearance:    Alert, cooperative, no distress, appears stated age  Head:    Normocephalic, without obvious abnormality, atraumatic  Eyes:    PERRL, conjunctiva/corneas clear, EOM's intact, fundi    benign, both eyes  Ears:    Normal TM's and external ear canals, both ears  Nose:   Nares normal, septum midline, mucosa normal, no drainage    or sinus tenderness  Throat:   Lips, mucosa, and tongue normal; teeth and gums normal  Neck:   Supple, symmetrical, trachea midline, no adenopathy;    thyroid:  no enlargement/tenderness/nodules; no carotid   bruit or JVD  Back:     Symmetric, no curvature, ROM normal, no CVA  tenderness  Lungs:     Clear to auscultation bilaterally, respirations unlabored  Chest Wall:    No tenderness or deformity   Heart:    Regular rate and rhythm, S1 and S2 normal, no murmur, rub   or gallop  Breast Exam:    Deferred  Abdomen:     Soft, non-tender, bowel sounds active all four quadrants,    no masses, no organomegaly  Genitalia:    Deferred  Extremities:   Extremities normal, atraumatic, no cyanosis or edema  Pulses:   2+ and symmetric all extremities  Skin:   Skin color, texture, turgor normal, no rashes or lesions  Lymph nodes:   Cervical, supraclavicular, and axillary nodes normal  Neurologic:   CNII-XII intact, normal strength, sensation and reflexes    throughout   Results for orders placed or performed in visit on 05/25/17  POCT Urinalysis Dipstick (Automated)  Result Value Ref Range   Color, UA Yellow    Clarity, UA Clear  Glucose, UA Negative    Bilirubin, UA Negative    Ketones, UA Negative    Spec Grav, UA 1.015 1.010 - 1.025   Blood, UA Negative    pH, UA 6.0 5.0 - 8.0   Protein, UA Negative    Urobilinogen, UA 0.2 0.2 or 1.0 E.U./dL   Nitrite, UA Negative    Leukocytes, UA Negative Negative  POCT urine pregnancy  Result Value Ref Range   Preg Test, Ur Negative Negative     Assessment/Plan:   Amarisa was seen today for annual exam.  Diagnoses and all orders for this visit:  Encounter for general adult medical examination with abnormal findings Today patient counseled on age appropriate routine health concerns for screening and prevention, each reviewed and up to date or declined. Immunizations reviewed and up to date or declined. Labs ordered and reviewed. Risk factors for depression reviewed and negative. Hearing function and visual acuity are intact. ADLs screened and addressed as needed. Functional ability and level of safety reviewed and appropriate. Education, counseling and referrals performed based on assessed risks today. Patient provided  with a copy of personalized plan for preventive services. -     CBC with Differential/Platelet -     Comprehensive metabolic panel -     TSH  Urinary frequency UA is negative but will send for culture. Suspect that constipation is contributing to these symptoms (see below.) Follow-up if symptoms persist, especially if new symptoms of fever or other symptoms. -     POCT Urinalysis Dipstick (Automated) -     POCT urine pregnancy -     Urine culture  Morbid obesity [facility]) Patient is interested in meeting with Dr. Dennard Nip. She is agreeable to bariatric surgery if indicated after meeting with Dr. Leafy Ro. Will check labs. She would benefit from nutrition counseling, but will send to Dr. Leafy Ro for management and guidance at this point. -     VITAMIN D 25 Hydroxy (Vit-D Deficiency, Fractures) -     Hemoglobin A1c -     Amb Referral to Bariatric Surgery  Hypertension, unspecified type Asymptomatic. Non-compliant with medications, encouraged her to take metoprolol as directed to help keep controlled. Work on diet as able.   Bipolar 1 disorder ([place]) Management per Eino Farber.  Family history of diabetes mellitus Check HgbA1c today. Referral to Dr. Dennard Nip.  Abnormal menstrual periods Declined gyn exam today. She is planning to call Ob-Gyn for further evaluation.  Constipation, unspecified constipation type Discussed miralax daily to help with regular BM's -- discussed drinking water, activity as able and fiber intake.   Breast cancer screening by mammogram Order placed.  Encounter for lipid screening for cardiovascular disease Labs placed.   Well Adult Exam: Labs ordered: Yes. Patient counseling was done. See below for items discussed. Discussed the patient's BMI.  The BMI BMI is not in the acceptable range; BMI management plan is completed Follow up in 3 months. Breast cancer screening: order placed. Cervical cancer screening: she is going to go to Ob-Gyn per her preference.    Patient Counseling: [x]    Nutrition: Stressed importance of moderation in sodium/caffeine intake, saturated fat and cholesterol, caloric balance, sufficient intake of fresh fruits, vegetables, fiber, calcium, iron, and 1 mg of folate supplement per day (for females capable of pregnancy).  [x]    Stressed the importance of regular exercise.   [x]    Substance Abuse: Discussed cessation/primary prevention of tobacco, alcohol, or other drug use; driving or other dangerous activities under the influence;  availability of treatment for abuse.   [x]    Injury prevention: Discussed safety belts, safety helmets, smoke detector, smoking near bedding or upholstery.   [x]    Sexuality: Discussed sexually transmitted diseases, partner selection, use of condoms, avoidance of unintended pregnancy  and contraceptive alternatives.  [x]    Dental health: Discussed importance of regular tooth brushing, flossing, and dental visits.  [x]    Health maintenance and immunizations reviewed. Please refer to Health maintenance section.   CMA or LPN served as scribe during this visit. History, Physical, and Plan performed by medical provider. Documentation and orders reviewed and attested to.  [name], PA-C [place]

## 2017-05-26 LAB — URINE CULTURE: Organism ID, Bacteria: NO GROWTH

## 2017-05-27 ENCOUNTER — Other Ambulatory Visit: Payer: Self-pay | Admitting: Physician Assistant

## 2017-05-27 ENCOUNTER — Encounter: Payer: Self-pay | Admitting: Physician Assistant

## 2017-05-27 ENCOUNTER — Other Ambulatory Visit: Payer: Self-pay | Admitting: *Deleted

## 2017-05-27 DIAGNOSIS — E559 Vitamin D deficiency, unspecified: Secondary | ICD-10-CM

## 2017-05-27 MED ORDER — VITAMIN D (ERGOCALCIFEROL) 1.25 MG (50000 UNIT) PO CAPS: 50000.0000 [IU] | ORAL_CAPSULE | ORAL | 0 refills | Status: DC

## 2017-05-27 MED ORDER — METFORMIN HCL 500 MG PO TABS: 500.0000 mg | ORAL_TABLET | Freq: Every day | ORAL | 2 refills | Status: DC

## 2017-06-02 DIAGNOSIS — F3181 Bipolar II disorder: Secondary | ICD-10-CM | POA: Diagnosis not present

## 2017-06-12 ENCOUNTER — Other Ambulatory Visit: Payer: Self-pay

## 2017-12-06 ENCOUNTER — Encounter (HOSPITAL_COMMUNITY): Payer: Self-pay

## 2017-12-06 ENCOUNTER — Other Ambulatory Visit: Payer: Self-pay

## 2017-12-06 ENCOUNTER — Encounter: Payer: Self-pay | Admitting: Physician Assistant

## 2017-12-06 ENCOUNTER — Ambulatory Visit (INDEPENDENT_AMBULATORY_CARE_PROVIDER_SITE_OTHER): Payer: PPO | Admitting: Physician Assistant

## 2017-12-06 ENCOUNTER — Emergency Department (HOSPITAL_COMMUNITY): Payer: PPO

## 2017-12-06 ENCOUNTER — Emergency Department (HOSPITAL_COMMUNITY)
Admission: EM | Admit: 2017-12-06 | Discharge: 2017-12-06 | Disposition: A | Discharge status: Home / Self Care | Payer: PPO | Attending: Emergency Medicine | Admitting: Emergency Medicine

## 2017-12-06 VITALS — BP 150/100 | HR 92 | Temp 99.0°F | Ht 60.0 in | Wt 316.0 lb

## 2017-12-06 DIAGNOSIS — R110 Nausea: Secondary | ICD-10-CM | POA: Insufficient documentation

## 2017-12-06 DIAGNOSIS — I10 Essential (primary) hypertension: Secondary | ICD-10-CM

## 2017-12-06 DIAGNOSIS — R1010 Upper abdominal pain, unspecified: Secondary | ICD-10-CM

## 2017-12-06 DIAGNOSIS — D649 Anemia, unspecified: Secondary | ICD-10-CM | POA: Diagnosis not present

## 2017-12-06 DIAGNOSIS — R1013 Epigastric pain: Secondary | ICD-10-CM | POA: Diagnosis not present

## 2017-12-06 DIAGNOSIS — K8020 Calculus of gallbladder without cholecystitis without obstruction: Secondary | ICD-10-CM | POA: Diagnosis not present

## 2017-12-06 DIAGNOSIS — R197 Diarrhea, unspecified: Secondary | ICD-10-CM | POA: Insufficient documentation

## 2017-12-06 LAB — COMPREHENSIVE METABOLIC PANEL WITH GFR
ALT: 29 U/L (ref 14–54)
AST: 31 U/L (ref 15–41)
Albumin: 3.4 g/dL — ABNORMAL LOW (ref 3.5–5.0)
Alkaline Phosphatase: 78 U/L (ref 38–126)
Anion gap: 13 (ref 5–15)
BUN: 9 mg/dL (ref 6–20)
CO2: 26 mmol/L (ref 22–32)
Calcium: 9.6 mg/dL (ref 8.9–10.3)
Chloride: 99 mmol/L — ABNORMAL LOW (ref 101–111)
Creatinine, Ser: 0.81 mg/dL (ref 0.44–1.00)
GFR calc Af Amer: >60 mL/min
GFR calc non Af Amer: >60 mL/min
Glucose, Bld: 103 mg/dL — ABNORMAL HIGH (ref 65–99)
Potassium: 3.9 mmol/L (ref 3.5–5.1)
Sodium: 138 mmol/L (ref 135–145)
Total Bilirubin: 0.8 mg/dL (ref 0.3–1.2)
Total Protein: 8 g/dL (ref 6.5–8.1)

## 2017-12-06 LAB — CBC
HCT: 32.1 % — ABNORMAL LOW (ref 36.0–46.0)
Hemoglobin: 10 g/dL — ABNORMAL LOW (ref 12.0–15.0)
MCH: 25.8 pg — ABNORMAL LOW (ref 26.0–34.0)
MCHC: 31.2 g/dL (ref 30.0–36.0)
MCV: 82.7 fL (ref 78.0–100.0)
Platelets: 365 10*3/uL (ref 150–400)
RBC: 3.88 MIL/uL (ref 3.87–5.11)
RDW: 16.8 % — ABNORMAL HIGH (ref 11.5–15.5)
WBC: 8.5 10*3/uL (ref 4.0–10.5)

## 2017-12-06 LAB — URINALYSIS, ROUTINE W REFLEX MICROSCOPIC
Bilirubin Urine: NEGATIVE
Glucose, UA: NEGATIVE mg/dL
Hgb urine dipstick: NEGATIVE
KETONES UR: NEGATIVE mg/dL
Leukocytes, UA: NEGATIVE
NITRITE: NEGATIVE
PROTEIN: NEGATIVE mg/dL
Specific Gravity, Urine: 1.024 (ref 1.005–1.030)
pH: 6 (ref 5.0–8.0)

## 2017-12-06 LAB — LIPASE, BLOOD: Lipase: 22 U/L (ref 11–51)

## 2017-12-06 LAB — POC URINE PREG, ED: Preg Test, Ur: NEGATIVE

## 2017-12-06 MED ORDER — METOPROLOL SUCCINATE ER 50 MG PO TB24: 50.0000 mg | ORAL_TABLET | Freq: Every day | ORAL | 0 refills | Status: DC

## 2017-12-06 MED ORDER — OMEPRAZOLE 20 MG PO CPDR: 20.0000 mg | DELAYED_RELEASE_CAPSULE | Freq: Every day | ORAL | 0 refills | Status: DC

## 2017-12-06 MED ORDER — ONDANSETRON 4 MG PO TBDP
4.0000 mg | ORAL_TABLET | Freq: Once | ORAL | Status: AC | PRN
  Administered 2017-12-06: 4 mg via ORAL
  Filled 2017-12-06: qty 1

## 2017-12-06 MED ORDER — GI COCKTAIL ~~LOC~~
30.0000 mL | Freq: Once | ORAL | Status: AC
  Administered 2017-12-06: 30 mL via ORAL
  Filled 2017-12-06: qty 30

## 2017-12-06 MED ORDER — ONDANSETRON 4 MG PO TBDP: 4.0000 mg | ORAL_TABLET | Freq: Three times a day (TID) | ORAL | 0 refills | Status: DC | PRN

## 2017-12-06 NOTE — Patient Instructions (Signed)
It was great to see you!  Please go to the ER for further evaluation and treatment.

## 2017-12-06 NOTE — ED Notes (Addendum)
Pt back from Ultrasound

## 2017-12-06 NOTE — Discharge Instructions (Signed)
Take Omeprazole (acid reducer) once daily for the next 1-2 weeks to see if this helps your symptoms Take Zofran as needed for nausea If your symptoms are not improving, please make an appointment with a general surgeon. A referral has been provided below Return if you develop a fever with severe, constant abdominal pain or uncontrollable vomiting

## 2017-12-06 NOTE — ED Notes (Signed)
Pt alert and oriented x4. Skin warm and dry. Respirations equal and unlabored. Abdomen soft and non distended. Pt ambulatory with steady gait. Pt reports abdominal pain. States tenderness in right and lower quadrants during hard palpation.

## 2017-12-06 NOTE — ED Provider Notes (Signed)
Patient placed in Quick Look pathway, seen and evaluated   Chief Complaint: Abdominal pain  HPI:   Pt reports abdominal pain for the last week. Reports pain comes and goes but today constant. Pain in epigastric area. Worse with eating or drinking. Reports nausea, no vomiting. Reports some diarrhea. No hx of abdominal surgeries. Tried taking tums, helps some. Denies alcohol. Went to PCP, sent here.   ROS: positive for abdominal pain, nausea, diarrhea, negative for urinary symptoms, vomiting, blood in stool or emesis, fever.   Physical Exam:   Gen: No distress  Neuro: Awake and Alert  Skin: Warm    Focused Exam: Regular HR and rhythm, lungs clear bilaterally. TTP in RUQ and epigastric area. Positive murphy's sign.   Pt with epigastric and RUQ tenderness, nausea, pain worse after eating. Concerning for cholecystitis. WIll get labs, [place] abd, will try zofran, gi cocktail for now to see if improves pt's symptoms.    Initiation of care has begun. The patient has been counseled on the process, plan, and necessity for staying for the completion/evaluation, and the remainder of the medical screening examination    Jeannett Senior, Hershal Coria 12/06/17 2009    Tegeler, Gwenyth Allegra, MD 12/07/17 [phone]

## 2017-12-06 NOTE — ED Triage Notes (Signed)
Pt presents with 1 week h/o epigastric pain that pt describes as cramping. Pt denies any vomiting, reports nausea and diarrhea.  Pt seen at PCP and referred here to r/o gallbladder.

## 2017-12-06 NOTE — Progress Notes (Signed)
[name] is a 52 y.o. female here for a new problem.  I acted as a Education administrator for [employer], PA-C Anselmo Pickler, LPN  History of Present Illness:   Chief Complaint  Patient presents with  . Abdominal Pain    Abdominal Pain  This is a new problem. Episode onset: Started a week ago. The onset quality is gradual. The problem occurs constantly. The problem has been gradually worsening. The pain is located in the epigastric region (Sharp RLQ cramping on Saturday). The pain is at a severity of 8/10. The pain is severe. The quality of the pain is cramping, aching and a sensation of fullness. The abdominal pain radiates to the chest (Right flank pain x several months, tender to touvh). Associated symptoms include belching, constipation, diarrhea, flatus, headaches and nausea. Pertinent negatives include no dysuria, hematuria or vomiting. The pain is aggravated by eating and deep breathing. The pain is relieved by belching and passing flatus. She has tried antacids and acetaminophen for the symptoms. The treatment provided no relief. There is no history of abdominal surgery, colon cancer, Crohn's disease, gallstones, GERD, irritable bowel syndrome, pancreatitis or ulcerative colitis.   Has noticed decreased urination x 1 week. No blood in diarrhea. Taking tums without relief.   Hypertension Currently prescribed metoprolol 50 mg. Patient denies chest pain, SOB, blurred vision, dizziness, unusual headaches, lower leg swelling. Patient is not compliant with medication -- she has been out of her medication for at least 3 weeks. Denies excessive caffeine intake, stimulant usage, excessive alcohol intake, or increase in salt consumption.  Patient's last menstrual period was 11/17/2017 (lmp unknown).   Past Medical History:  Diagnosis Date  . Bipolar 1 disorder ([place])   . History of chicken pox   . Hypertension   . Vitamin D deficiency      Social History   Socioeconomic History  . Marital  status: Single    Spouse name: Not on file  . Number of children: Not on file  . Years of education: Not on file  . Highest education level: Not on file  Social Needs  . Financial resource strain: Not on file  . Food insecurity - worry: Not on file  . Food insecurity - inability: Not on file  . Transportation needs - medical: Not on file  . Transportation needs - non-medical: Not on file  Occupational History  . Not on file  Tobacco Use  . Smoking status: Never Smoker  . Smokeless tobacco: Never Used  Substance and Sexual Activity  . Alcohol use: Yes    Alcohol/week: 0.6 - 1.2 oz    Types: 1 - 2 Glasses of wine per week  . Drug use: No  . Sexual activity: No  Other Topics Concern  . Not on file  Social History Narrative   Lives in [place], was living in [place], [place] for 20 years   No children, not married   Single   Family is nearby, mom and sister nearby   Currently on disability: 4 years ago she had an episode of psychosis (states that she didn't sleep for ~6-8 days prior to) and was in and out of mental institution for 6 months, lost her job and had to move back to [place] to be near family    Past Surgical History:  Procedure Laterality Date  . BREAST REDUCTION SURGERY  1986  . Heart defect surgery    . TONSILLECTOMY      Family History  Problem Relation Age  of Onset  . Diabetes Mother   . Hypertension Mother   . Stroke Father   . Diabetes Maternal Grandmother   . CVA Maternal Grandmother   . Diabetes Maternal Grandfather   . Colon cancer Paternal Grandmother   . Diabetes Paternal Grandfather   . Breast cancer Maternal Aunt     No Known Allergies  Current Medications:   Current Outpatient Medications:  .  ARIPiprazole (ABILIFY) 5 MG tablet, TAKE 1 TABELET BY MOUTH AT BEDTIME, Disp: , Rfl: 1 .  doxepin (SINEQUAN) 25 MG capsule, TAKE 1 TO 2 CAPSULES AT BEDTIME, Disp: , Rfl: 1 .  metFORMIN (GLUCOPHAGE) 500 MG tablet, Take 1 tablet (500 mg total) by mouth  daily with breakfast., Disp: 30 tablet, Rfl: 2 .  metoprolol succinate (TOPROL-XL) 50 MG 24 hr tablet, Take 1 tablet (50 mg total) by mouth daily. Take with or immediately following a meal., Disp: 90 tablet, Rfl: 0   Review of Systems:   Review of Systems  Gastrointestinal: Positive for abdominal pain, constipation, diarrhea, flatus and nausea. Negative for vomiting.  Genitourinary: Negative for dysuria and hematuria.  Neurological: Positive for headaches.    Vitals:   Vitals:   12/06/17 1534  BP: (!) 150/100  Pulse: 92  Temp: 99 F (37.2 C)  TempSrc: Oral  SpO2: 97%  Weight: (!) 316 lb (143.3 kg)  Height: 5' (1.524 m)     Body mass index is 61.71 kg/m.  Physical Exam:   Physical Exam  Constitutional: She appears well-developed. She is cooperative.  Non-toxic appearance. She does not have a sickly appearance. She does not appear ill. No distress.  Cardiovascular: Normal rate, regular rhythm, S1 normal, S2 normal, normal heart sounds and normal pulses.  No LE edema  Pulmonary/Chest: Effort normal and breath sounds normal.  Abdominal: Normal appearance. There is tenderness in the right upper quadrant and epigastric area. There is guarding. There is no rigidity.  Neurological: She is alert. GCS eye subscore is 4. GCS verbal subscore is 5. GCS motor subscore is 6.  Skin: Skin is warm, dry and intact.  Psychiatric: She has a normal mood and affect. Her speech is normal and behavior is normal.  Nursing note and vitals reviewed.   Assessment and Plan:    Eithel was seen today for abdominal pain.  Diagnoses and all orders for this visit:  Pain of upper abdomen Discussed plan of care with Dr. Briscoe Deutscher who was also in to see patient. Discussed that etiology of symptoms is likely gallbladder, however in order to best diagnose and treat patient, she opted for treatment in the ER. Offered patient EMS, however she declined. Further evaluation and treatment per  ER.  Hypertension, unspecified type Uncontrolled today likely 2/2 noncompliance and pain response. Encouraged compliance with medications and follow-up. Refilled today. Follow-up within 1 month.  Other orders -     metoprolol succinate (TOPROL-XL) 50 MG 24 hr tablet; Take 1 tablet (50 mg total) by mouth daily. Take with or immediately following a meal.    . Reviewed expectations re: course of current medical issues. . Discussed self-management of symptoms. . Outlined signs and symptoms indicating need for more acute intervention. . Patient verbalized understanding and all questions were answered. . See orders for this visit as documented in the electronic medical record. . Patient received an After-Visit Summary.  CMA or LPN served as scribe during this visit. History, Physical, and Plan performed by medical provider. Documentation and orders reviewed and attested to.  [name], PA-C

## 2017-12-08 NOTE — ED Provider Notes (Signed)
[facility] EMERGENCY DEPARTMENT Provider Note   CSN: [id number] Arrival date & time: 12/06/17  1658     History   Chief Complaint Chief Complaint  Patient presents with  . Abdominal Pain    HPI [name] is a 52 y.o. female who presents with upper abdominal pain. PMH significant for obesity, HTN, bipolar d/o. She states that over the past week she has had gradually worsening upper abdominal pain. The pain initially was intermittent whenever she ate something, however over the past couple days it has become constant. She states she's gone through a bottle of Tums which provides temporary relief. The pain will be triggered no matter what she eats. The pain is sharp in quality. It radiates to the LUQ, RUQ, and central chest. She reports associated indigestion, burping, nausea and a small amount of diarrhea. She denies fever, chills, SOB, vomiting, urinary symptoms. She denies prior abdominal surgeries. She denies prior colonoscopy or endoscopy. She's afraid it may be her gallbladder. She received a GI cocktail and Zofran in triage which brought her pain down from a 8/10 to 2/10.  HPI  Past Medical History:  Diagnosis Date  . Bipolar 1 disorder ([place])   . History of chicken pox   . Hypertension   . Vitamin D deficiency     Patient Active Problem List   Diagnosis Date Noted  . Vitamin D deficiency   . Sleep apnea 03/31/2017  . Morbid obesity ([place]) 03/31/2017  . Hypertension 03/31/2017  . Bipolar 1 disorder ([place]) 03/31/2017    Past Surgical History:  Procedure Laterality Date  . BREAST REDUCTION SURGERY  1986  . Heart defect surgery    . TONSILLECTOMY      OB History    No data available       Home Medications    Prior to Admission medications   Medication Sig Start Date End Date Taking? Authorizing Provider  ARIPiprazole (ABILIFY) 5 MG tablet Take 5 mg by mouth at bedtime 03/21/17  Yes [provider]  doxepin (SINEQUAN) 25 MG capsule  Take 50 mg by mouth at bedtime 03/21/17  Yes [provider]  metFORMIN (GLUCOPHAGE) 500 MG tablet Take 1 tablet (500 mg total) by mouth daily with breakfast. 05/27/17  Yes [name], PA  metoprolol succinate (TOPROL-XL) 50 MG 24 hr tablet Take 1 tablet (50 mg total) by mouth daily. Take with or immediately following a meal. 12/06/17  Yes [name], PA  omeprazole (PRILOSEC) 20 MG capsule Take 1 capsule (20 mg total) by mouth daily. 12/06/17   Recardo Evangelist, PA-C  ondansetron (ZOFRAN ODT) 4 MG disintegrating tablet Take 1 tablet (4 mg total) by mouth every 8 (eight) hours as needed for nausea or vomiting. 12/06/17   Recardo Evangelist, PA-C    Family History Family History  Problem Relation Age of Onset  . Diabetes Mother   . Hypertension Mother   . Stroke Father   . Diabetes Maternal Grandmother   . CVA Maternal Grandmother   . Diabetes Maternal Grandfather   . Colon cancer Paternal Grandmother   . Diabetes Paternal Grandfather   . Breast cancer Maternal Aunt     Social History Social History   Tobacco Use  . Smoking status: Never Smoker  . Smokeless tobacco: Never Used  Substance Use Topics  . Alcohol use: Yes    Alcohol/week: 0.6 - 1.2 oz    Types: 1 - 2 Glasses of wine per week  . Drug  use: No     Allergies   Patient has no known allergies.   Review of Systems Review of Systems  Constitutional: Negative for chills and fever.  Respiratory: Negative for shortness of breath.   Cardiovascular: Positive for chest pain.  Gastrointestinal: Positive for abdominal pain, diarrhea and nausea. Negative for vomiting.  Genitourinary: Negative for dysuria and frequency.  All other systems reviewed and are negative.    Physical Exam Updated Vital Signs BP 131/66   Pulse 81   Temp 99 F (37.2 C) (Oral)   Resp 17   LMP 11/17/2017 (LMP Unknown)   SpO2 99%   Physical Exam  Constitutional: She is oriented to person, place, and time. She appears  well-developed and well-nourished. No distress.  Obese, calm, cooperative  HENT:  Head: Normocephalic and atraumatic.  Eyes: Conjunctivae are normal. Pupils are equal, round, and reactive to light. Right eye exhibits no discharge. Left eye exhibits no discharge. No scleral icterus.  Neck: Normal range of motion.  Cardiovascular: Normal rate and regular rhythm.  Pulmonary/Chest: Effort normal and breath sounds normal. No respiratory distress.  Abdominal: Soft. Bowel sounds are normal. She exhibits no distension. There is tenderness (epigastric tenderness with mild LUQ and mild RUQ tenderness).  Neurological: She is alert and oriented to person, place, and time.  Skin: Skin is warm and dry.  Psychiatric: She has a normal mood and affect. Her behavior is normal.  Nursing note and vitals reviewed.    ED Treatments / Results  Labs (all labs ordered are listed, but only abnormal results are displayed) Labs Reviewed  COMPREHENSIVE METABOLIC PANEL - Abnormal; Notable for the following components:      Result Value   Chloride 99 (*)    Glucose, Bld 103 (*)    Albumin 3.4 (*)    All other components within normal limits  CBC - Abnormal; Notable for the following components:   Hemoglobin 10.0 (*)    HCT 32.1 (*)    MCH 25.8 (*)    RDW 16.8 (*)    All other components within normal limits  LIPASE, BLOOD  URINALYSIS, ROUTINE W REFLEX MICROSCOPIC  POC URINE PREG, ED    EKG  EKG Interpretation None       Radiology US Abdomen Complete  Result Date: 12/06/2017 CLINICAL DATA:  Abdominal pain EXAM: ABDOMEN ULTRASOUND COMPLETE COMPARISON:  None. FINDINGS: Gallbladder: Contracted with multiple shadowing stones measuring up to 1.1 cm. Negative sonographic Murphy. Mild wall thickening at 4 mm. Common bile duct: Diameter: 3.9 mm Liver: Increased echogenicity. No focal abnormality. Portal vein is patent on color Doppler imaging with normal direction of blood flow towards the liver. IVC: No  abnormality visualized. Pancreas: Visualized portion unremarkable. Spleen: Size and appearance within normal limits. Right Kidney: Length: 12 cm. Echogenicity within normal limits. No mass or hydronephrosis visualized. Left Kidney: Length: 11 cm. Echogenicity within normal limits. No mass or hydronephrosis visualized. Abdominal aorta: No aneurysm visualized. Other findings: None. IMPRESSION: 1. Contracted gallbladder with multiple shadowing stones and mild wall thickening, likely augmented by contracted appearance of gallbladder. Negative sonographic Murphy. No biliary dilatation 2. Slight increased hepatic echogenicity, possible steatosis. Electronically Signed   By: Donavan Foil M.D.   On: 12/06/2017 19:01    Procedures Procedures (including critical care time)  Medications Ordered in ED Medications  ondansetron (ZOFRAN-ODT) disintegrating tablet 4 mg (4 mg Oral Given 12/06/17 1732)  gi cocktail (Maalox,Lidocaine,Donnatal) (30 mLs Oral Given 12/06/17 1733)     Initial Impression / Assessment  and Plan / ED Course  I have reviewed the triage vital signs and the nursing notes.  Pertinent labs & imaging results that were available during my care of the patient were reviewed by me and considered in my medical decision making (see chart for details).  52 year old female presents with epigastric abdominal pain. Most likely pain is due to gastritis vs PUD vs biliary colic. She is mildly hypertensive but otherwise vitals are normal. Abdomen is most tender in epigastric area but she is tender throughout the upper abdomen. She did get significant relief from the GI cocktail so unclear if her symptoms are related to her gallbladder vs her stomach. CBC is remarkable for anemia which is at baseline. CMP and lipase are normal. UA is normal. Abdominal US is remarkable for gallstones with mild wall thickening. She has no fever, no leukocytosis, and pain is minimal. Doubt cholecystitis. She will be put on a PPI and  given rx for Zofran. She was advised to try this for a week and avoid foods that will aggravate the gallbladder. If symptoms persist, she was advised to f/u with general surgery. A referral was given. She was also advised to return if she had worsening symptoms.  Final Clinical Impressions(s) / ED Diagnoses   Final diagnoses:  Epigastric pain  Anemia, unspecified type    ED Discharge Orders        Ordered    ondansetron (ZOFRAN ODT) 4 MG disintegrating tablet  Every 8 hours PRN     12/06/17 2216    omeprazole (PRILOSEC) 20 MG capsule  Daily     12/06/17 2216       Recardo Evangelist, PA-C 12/08/17 1528    Blanchie Dessert, MD 12/10/17 [phone]

## 2018-02-07 DIAGNOSIS — E119 Type 2 diabetes mellitus without complications: Secondary | ICD-10-CM | POA: Diagnosis not present

## 2018-02-07 LAB — HM DIABETES EYE EXAM

## 2018-02-08 ENCOUNTER — Ambulatory Visit (INDEPENDENT_AMBULATORY_CARE_PROVIDER_SITE_OTHER): Payer: PPO | Admitting: Physician Assistant

## 2018-02-08 ENCOUNTER — Encounter: Payer: Self-pay | Admitting: Physician Assistant

## 2018-02-08 ENCOUNTER — Other Ambulatory Visit: Payer: Self-pay | Admitting: Physician Assistant

## 2018-02-08 VITALS — BP 144/100 | HR 81 | Temp 98.7°F | Ht 60.0 in | Wt 317.0 lb

## 2018-02-08 DIAGNOSIS — I10 Essential (primary) hypertension: Secondary | ICD-10-CM

## 2018-02-08 DIAGNOSIS — G4733 Obstructive sleep apnea (adult) (pediatric): Secondary | ICD-10-CM

## 2018-02-08 DIAGNOSIS — Z1231 Encounter for screening mammogram for malignant neoplasm of breast: Secondary | ICD-10-CM

## 2018-02-08 DIAGNOSIS — R7309 Other abnormal glucose: Secondary | ICD-10-CM

## 2018-02-08 DIAGNOSIS — F319 Bipolar disorder, unspecified: Secondary | ICD-10-CM | POA: Diagnosis not present

## 2018-02-08 LAB — POCT GLYCOSYLATED HEMOGLOBIN (HGB A1C): Hemoglobin A1C: 5.9

## 2018-02-08 MED ORDER — LISINOPRIL-HYDROCHLOROTHIAZIDE 10-12.5 MG PO TABS: 1.0000 | ORAL_TABLET | Freq: Every day | ORAL | 1 refills | Status: DC

## 2018-02-08 NOTE — Progress Notes (Signed)
[name] is a 53 y.o. female is here to discuss: Prediabetic  I acted as a Education administrator for [employer], PA-C Anselmo Pickler, LPN  History of Present Illness:   Chief Complaint  Patient presents with  . Pre diabetic   Elevated Glucose Level and Obesity Pt here for follow up on elevated glucose level and Hgb A1c was 6.1 in August. Pt is only taking medication 3 -4 days out of the week. Forgets to take medication. Pt is just starting to exercise, has a treadmill at home that she has started to use.  Patient continues to contemplate bariatric surgery.  She is interested in evaluation.  She reports that she has recently had some increased with her mother recently passing and reports that her eating has been the best.   Wt Readings from Last 5 Encounters:  02/08/18 (!) 317 lb (143.8 kg)  12/06/17 (!) 316 lb (143.3 kg)  05/25/17 (!) 314 lb 9.6 oz (142.7 kg)  03/31/17 (!) 312 lb (141.5 kg)   Bipolar Disorder Goes to [facility].  She does report that she has not been to her doctor in a while.  She is also not seeing her counselor.  She is currently on Abilify, and tries to take it every day.  She had better results with Latuda, but was not able to afford it.  She denies any suicidal or homicidal thoughts today.  OSA Patient reports that several years ago she was diagnosed with sleep apnea.  She continues to struggle with issues of feeling daytime somnolence, and does not feel well rested.  She was prescribed a CPAP but felt that it was too claustrophobic so she has never owned or used one.  Essential HTN Her blood pressure is elevated today.  She reports he takes her blood pressure medicine most of the time.  She does not check her blood pressures.  She states that she has been having some dull headaches and changes in vision intermittently.  She went to the eye doctor and they did not see any evidence of issues with pressure or diabetes per her report.   Health Maintenance Due    Topic Date Due  . HIV Screening  12/31/1982  . MAMMOGRAM  12/30/2017  . COLONOSCOPY  12/30/2017    Past Medical History:  Diagnosis Date  . Bipolar 1 disorder ([place])   . History of chicken pox   . Hypertension   . Vitamin D deficiency      Social History   Socioeconomic History  . Marital status: Single    Spouse name: Not on file  . Number of children: Not on file  . Years of education: Not on file  . Highest education level: Not on file  Occupational History  . Not on file  Social Needs  . Financial resource strain: Not on file  . Food insecurity:    Worry: Not on file    Inability: Not on file  . Transportation needs:    Medical: Not on file    Non-medical: Not on file  Tobacco Use  . Smoking status: Never Smoker  . Smokeless tobacco: Never Used  Substance and Sexual Activity  . Alcohol use: Yes    Alcohol/week: 0.6 - 1.2 oz    Types: 1 - 2 Glasses of wine per week  . Drug use: No  . Sexual activity: Never  Lifestyle  . Physical activity:    Days per week: Not on file    Minutes per  session: Not on file  . Stress: Not on file  Relationships  . Social connections:    Talks on phone: Not on file    Gets together: Not on file    Attends religious service: Not on file    Active member of club or organization: Not on file    Attends meetings of clubs or organizations: Not on file    Relationship status: Not on file  . Intimate partner violence:    Fear of current or ex partner: Not on file    Emotionally abused: Not on file    Physically abused: Not on file    Forced sexual activity: Not on file  Other Topics Concern  . Not on file  Social History Narrative   Lives in [place], was living in [place], [place] for 20 years   No children, not married   Single   Family is nearby, mom and sister nearby   Currently on disability: 4 years ago she had an episode of psychosis (states that she didn't sleep for ~6-8 days prior to) and was in and out of mental  institution for 6 months, lost her job and had to move back to [place] to be near family    Past Surgical History:  Procedure Laterality Date  . BREAST REDUCTION SURGERY  1986  . Heart defect surgery    . TONSILLECTOMY      Family History  Problem Relation Age of Onset  . Diabetes Mother   . Hypertension Mother   . Stroke Father   . Diabetes Maternal Grandmother   . CVA Maternal Grandmother   . Diabetes Maternal Grandfather   . Colon cancer Paternal Grandmother   . Diabetes Paternal Grandfather   . Breast cancer Maternal Aunt     PMHx, SurgHx, SocialHx, FamHx, Medications, and Allergies were reviewed in the Visit Navigator and updated as appropriate.   Patient Active Problem List   Diagnosis Date Noted  . Vitamin D deficiency   . Sleep apnea 03/31/2017  . Morbid obesity ([place]) 03/31/2017  . Hypertension 03/31/2017  . Bipolar 1 disorder ([place]) 03/31/2017    Social History   Tobacco Use  . Smoking status: Never Smoker  . Smokeless tobacco: Never Used  Substance Use Topics  . Alcohol use: Yes    Alcohol/week: 0.6 - 1.2 oz    Types: 1 - 2 Glasses of wine per week  . Drug use: No    Current Medications and Allergies:    Current Outpatient Medications:  .  ARIPiprazole (ABILIFY) 5 MG tablet, Take 5 mg by mouth at bedtime, Disp: , Rfl: 1 .  doxepin (SINEQUAN) 25 MG capsule, Take 50 mg by mouth at bedtime, Disp: , Rfl: 1 .  metFORMIN (GLUCOPHAGE) 500 MG tablet, Take 1 tablet (500 mg total) by mouth daily with breakfast., Disp: 30 tablet, Rfl: 2 .  metoprolol succinate (TOPROL-XL) 50 MG 24 hr tablet, Take 1 tablet (50 mg total) by mouth daily. Take with or immediately following a meal., Disp: 90 tablet, Rfl: 0 .  lisinopril-hydrochlorothiazide (PRINZIDE,ZESTORETIC) 10-12.5 MG tablet, Take 1 tablet by mouth daily., Disp: 30 tablet, Rfl: 1 .  omeprazole (PRILOSEC) 20 MG capsule, Take 1 capsule (20 mg total) by mouth daily. (Patient not taking: Reported on 02/08/2018), Disp: 30  capsule, Rfl: 0  No Known Allergies  Review of Systems   ROS  Negative unless otherwise specified per HPI.  Vitals:   Vitals:   02/08/18 0918 02/08/18 0944  BP: (!) 156/100 [name] Kitchen)  144/100  Pulse: 81   Temp: 98.7 F (37.1 C)   TempSrc: Oral   SpO2: 97%   Weight: (!) 317 lb (143.8 kg)   Height: 5' (1.524 m)      Body mass index is 61.91 kg/m.   Physical Exam:    Physical Exam  Constitutional: She appears well-developed. She is cooperative.  Non-toxic appearance. She does not have a sickly appearance. She does not appear ill. No distress.  Cardiovascular: Normal rate, regular rhythm, S1 normal, S2 normal, normal heart sounds and normal pulses.  No LE edema  Pulmonary/Chest: Effort normal and breath sounds normal.  Neurological: She is alert. GCS eye subscore is 4. GCS verbal subscore is 5. GCS motor subscore is 6.  Skin: Skin is warm, dry and intact.  Psychiatric: She has a normal mood and affect. Her speech is normal and behavior is normal.  Nursing note and vitals reviewed.   Results for orders placed or performed in visit on 02/08/18  POCT glycosylated hemoglobin (Hb A1C)  Result Value Ref Range   Hemoglobin A1C 5.9     Assessment and Plan:    Enyla was seen today for pre diabetic.  Diagnoses and all orders for this visit:  Elevated glucose level Hemoglobin A1c improved to 5.9 today.  I discussed with her that I would like her to continue with 500 mg of metformin daily and I encouraged her to take this every day.  Continue to work on diet as able. I commended patient for beginning to exercise. -     POCT glycosylated hemoglobin (Hb A1C)  Hypertension, unspecified type Currently uncontrolled.  Suspect that her uncontrolled sleep apnea that is contributing to this. Continue Toprol. Initiate 10-12.5 mg Lisinopril-HCTZ today. Follow-up in 2-4 weeks for further evaluation. Sooner if concerns or symptoms.  Obstructive sleep apnea syndrome I will put in a new referral  for this.  Bipolar 1 disorder [facility]) She agreed to call her psychiatrist and therapist today for follow-up.  Morbid obesity ([place]) We had a long discussion about bariatric surgery.  Patient would like to have an appointment to discuss this with the surgical team.  I will put this consult in today.  Other orders -     lisinopril-hydrochlorothiazide (PRINZIDE,ZESTORETIC) 10-12.5 MG tablet; Take 1 tablet by mouth daily.      . Reviewed expectations re: course of current medical issues. . Discussed self-management of symptoms. . Outlined signs and symptoms indicating need for more acute intervention. . Patient verbalized understanding and all questions were answered. . See orders for this visit as documented in the electronic medical record. . Patient received an After Visit Summary.  CMA or LPN served as scribe during this visit. History, Physical, and Plan performed by medical provider. Documentation and orders reviewed and attested to.  [name], PA-C [place] 02/08/2018  Follow-up: No follow-ups on file.

## 2018-02-08 NOTE — Patient Instructions (Addendum)
1. Call and get back in with your psychiatrist and therapist 2. Continue Metformin daily 3. You will be contacted about your referral for weight loss surgery and sleep study 4. Schedule your mammogram 5. Start new blood pressure medication  Follow-up in 2-4 weeks so we can discuss your blood pressure.

## 2018-02-20 ENCOUNTER — Other Ambulatory Visit: Payer: Self-pay | Admitting: *Deleted

## 2018-03-01 ENCOUNTER — Ambulatory Visit
Admission: RE | Admit: 2018-03-01 | Discharge: 2018-03-01 | Disposition: A | Discharge status: Home / Self Care | Payer: PPO | Source: Ambulatory Visit | Attending: Physician Assistant | Admitting: Physician Assistant

## 2018-03-01 DIAGNOSIS — Z1231 Encounter for screening mammogram for malignant neoplasm of breast: Secondary | ICD-10-CM

## 2018-03-08 ENCOUNTER — Ambulatory Visit (INDEPENDENT_AMBULATORY_CARE_PROVIDER_SITE_OTHER): Payer: PPO | Admitting: Physician Assistant

## 2018-03-08 ENCOUNTER — Encounter: Payer: Self-pay | Admitting: Physician Assistant

## 2018-03-08 VITALS — BP 132/80 | HR 77 | Temp 98.3°F | Ht 60.0 in | Wt 317.4 lb

## 2018-03-08 DIAGNOSIS — D649 Anemia, unspecified: Secondary | ICD-10-CM | POA: Diagnosis not present

## 2018-03-08 DIAGNOSIS — F319 Bipolar disorder, unspecified: Secondary | ICD-10-CM

## 2018-03-08 DIAGNOSIS — I10 Essential (primary) hypertension: Secondary | ICD-10-CM | POA: Diagnosis not present

## 2018-03-08 LAB — CBC WITH DIFFERENTIAL/PLATELET
BASOS ABS: 0 10*3/uL (ref 0.0–0.1)
Basophils Relative: 0.1 % (ref 0.0–3.0)
EOS ABS: 0.1 10*3/uL (ref 0.0–0.7)
Eosinophils Relative: 1 % (ref 0.0–5.0)
HCT: 32.2 % — ABNORMAL LOW (ref 36.0–46.0)
Hemoglobin: 10.2 g/dL — ABNORMAL LOW (ref 12.0–15.0)
LYMPHS ABS: 1.5 10*3/uL (ref 0.7–4.0)
LYMPHS PCT: 23.5 % (ref 12.0–46.0)
MCHC: 31.6 g/dL (ref 30.0–36.0)
MCV: 80 fl (ref 78.0–100.0)
Monocytes Absolute: 0.4 10*3/uL (ref 0.1–1.0)
Monocytes Relative: 6.3 % (ref 3.0–12.0)
NEUTROS ABS: 4.5 10*3/uL (ref 1.4–7.7)
NEUTROS PCT: 69.1 % (ref 43.0–77.0)
PLATELETS: 403 10*3/uL — AB (ref 150.0–400.0)
RBC: 4.03 Mil/uL (ref 3.87–5.11)
RDW: 17.4 % — ABNORMAL HIGH (ref 11.5–15.5)
WBC: 6.5 10*3/uL (ref 4.0–10.5)

## 2018-03-08 LAB — BASIC METABOLIC PANEL
BUN: 13 mg/dL (ref 6–23)
CHLORIDE: 103 meq/L (ref 96–112)
CO2: 27 mEq/L (ref 19–32)
Calcium: 8.9 mg/dL (ref 8.4–10.5)
Creatinine, Ser: 0.61 mg/dL (ref 0.40–1.20)
GFR: 133.42 mL/min (ref >60.00)
Glucose, Bld: 97 mg/dL (ref 70–99)
POTASSIUM: 4 meq/L (ref 3.5–5.1)
Sodium: 137 mEq/L (ref 135–145)

## 2018-03-08 NOTE — Patient Instructions (Signed)
It was great to see you!  Take metoprolol every other day x 1 week. Then stop. You may have some jittery feelings, but this should pass. If other worsening symptoms, please let us know.  Follow-up in 1 month just to make sure blood pressure looks good off of the metoprolol.

## 2018-03-08 NOTE — Progress Notes (Signed)
[name] is a 52 y.o. female is here to discuss: Hypertension  I acted as a Education administrator for [employer], PA-C Anselmo Pickler, LPN  History of Present Illness:   Chief Complaint  Patient presents with  . hypertension f/u    doing well    Hypertension  This is a chronic (Pt is here today for follow up, pt is checking blood pressure daily stystolic is 102-725, dystolic 36-64) problem. The current episode started more than 1 year ago. The problem has been gradually improving since onset. The problem is controlled. Associated symptoms include headaches, malaise/fatigue and shortness of breath (with exertion). Pertinent negatives include no anxiety, blurred vision, chest pain, palpitations or peripheral edema. There are no associated agents to hypertension. Risk factors for coronary artery disease include obesity and post-menopausal state. The current treatment provides significant improvement. Identifiable causes of hypertension include sleep apnea.   She reports that she is doing well with her blood pressures. Denies any side effects from the medication at this time.  She has a sleep sleep study planned for July.  Wt Readings from Last 5 Encounters:  03/08/18 (!) 317 lb 6.1 oz (144 kg)  02/08/18 (!) 317 lb (143.8 kg)  12/06/17 (!) 316 lb (143.3 kg)  05/25/17 (!) 314 lb 9.6 oz (142.7 kg)  03/31/17 (!) 312 lb (141.5 kg)   Bipolar She is currently been off of her Abilify for about a week and a half.  She still has not made an appointment to see her counselor, Wells Guiles.  I recommended that she do this immediately, and also needs to be seen by her psychiatrist.  Denies suicidal thoughts.  Anemia Most recent CBC showed a hemoglobin of 10.0.  She is having fatigue, but she is unsure if this is related to her bipolar disorder.  She does not take iron as it causes constipation.  Her periods have been unusually light recently. CBC    Component Value Date/Time   WBC 8.5 12/06/2017 1729   RBC  3.88 12/06/2017 1729   HGB 10.0 (L) 12/06/2017 1729   HCT 32.1 (L) 12/06/2017 1729   PLT 365 12/06/2017 1729   MCV 82.7 12/06/2017 1729   MCH 25.8 (L) 12/06/2017 1729   MCHC 31.2 12/06/2017 1729   RDW 16.8 (H) 12/06/2017 1729   LYMPHSABS 1.6 05/25/2017 1102   MONOABS 0.5 05/25/2017 1102   EOSABS 0.1 05/25/2017 1102   BASOSABS 0.0 05/25/2017 1102      Health Maintenance Due  Topic Date Due  . HIV Screening  12/31/1982  . COLONOSCOPY  12/30/2017    Past Medical History:  Diagnosis Date  . Bipolar 1 disorder ([place])   . History of chicken pox   . Hypertension   . Vitamin D deficiency      Social History   Socioeconomic History  . Marital status: Single    Spouse name: Not on file  . Number of children: Not on file  . Years of education: Not on file  . Highest education level: Not on file  Occupational History  . Not on file  Social Needs  . Financial resource strain: Not on file  . Food insecurity:    Worry: Not on file    Inability: Not on file  . Transportation needs:    Medical: Not on file    Non-medical: Not on file  Tobacco Use  . Smoking status: Never Smoker  . Smokeless tobacco: Never Used  Substance and Sexual Activity  . Alcohol  use: Yes    Alcohol/week: 0.6 - 1.2 oz    Types: 1 - 2 Glasses of wine per week  . Drug use: No  . Sexual activity: Never  Lifestyle  . Physical activity:    Days per week: Not on file    Minutes per session: Not on file  . Stress: Not on file  Relationships  . Social connections:    Talks on phone: Not on file    Gets together: Not on file    Attends religious service: Not on file    Active member of club or organization: Not on file    Attends meetings of clubs or organizations: Not on file    Relationship status: Not on file  . Intimate partner violence:    Fear of current or ex partner: Not on file    Emotionally abused: Not on file    Physically abused: Not on file    Forced sexual activity: Not on file    Other Topics Concern  . Not on file  Social History Narrative   Lives in [place], was living in [place], [place] for 20 years   No children, not married   Single   Family is nearby, mom and sister nearby   Currently on disability: 4 years ago she had an episode of psychosis (states that she didn't sleep for ~6-8 days prior to) and was in and out of mental institution for 6 months, lost her job and had to move back to [place] to be near family    Past Surgical History:  Procedure Laterality Date  . BREAST REDUCTION SURGERY  1986  . Heart defect surgery    . TONSILLECTOMY      Family History  Problem Relation Age of Onset  . Diabetes Mother   . Hypertension Mother   . Stroke Father   . Diabetes Maternal Grandmother   . CVA Maternal Grandmother   . Diabetes Maternal Grandfather   . Colon cancer Paternal Grandmother   . Diabetes Paternal Grandfather   . Breast cancer Maternal Aunt     PMHx, SurgHx, SocialHx, FamHx, Medications, and Allergies were reviewed in the Visit Navigator and updated as appropriate.   Patient Active Problem List   Diagnosis Date Noted  . Vitamin D deficiency   . Sleep apnea 03/31/2017  . Morbid obesity ([place]) 03/31/2017  . Hypertension 03/31/2017  . Bipolar 1 disorder ([place]) 03/31/2017    Social History   Tobacco Use  . Smoking status: Never Smoker  . Smokeless tobacco: Never Used  Substance Use Topics  . Alcohol use: Yes    Alcohol/week: 0.6 - 1.2 oz    Types: 1 - 2 Glasses of wine per week  . Drug use: No    Current Medications and Allergies:    Current Outpatient Medications:  .  ARIPiprazole (ABILIFY) 5 MG tablet, Take 5 mg by mouth at bedtime, Disp: , Rfl: 1 .  doxepin (SINEQUAN) 25 MG capsule, Take 50 mg by mouth at bedtime, Disp: , Rfl: 1 .  lisinopril-hydrochlorothiazide (PRINZIDE,ZESTORETIC) 10-12.5 MG tablet, Take 1 tablet by mouth daily., Disp: 30 tablet, Rfl: 1 .  metFORMIN (GLUCOPHAGE) 500 MG tablet, Take 1 tablet (500 mg total) by  mouth daily with breakfast., Disp: 30 tablet, Rfl: 2 .  metoprolol succinate (TOPROL-XL) 50 MG 24 hr tablet, Take 1 tablet (50 mg total) by mouth daily. Take with or immediately following a meal., Disp: 90 tablet, Rfl: 0  No Known Allergies  Review of  Systems   Review of Systems  Constitutional: Positive for malaise/fatigue.  Eyes: Negative for blurred vision.  Respiratory: Positive for shortness of breath (with exertion).   Cardiovascular: Negative for chest pain and palpitations.  Neurological: Positive for headaches.    Vitals:   Vitals:   03/08/18 1011  BP: 132/80  Pulse: 77  Temp: 98.3 F (36.8 C)  TempSrc: Oral  SpO2: 98%  Weight: (!) 317 lb 6.1 oz (144 kg)  Height: 5' (1.524 m)     Body mass index is 61.98 kg/m.   Physical Exam:    Physical Exam  Constitutional: She appears well-developed. She is cooperative.  Non-toxic appearance. She does not have a sickly appearance. She does not appear ill. No distress.  Cardiovascular: Normal rate, regular rhythm, S1 normal, S2 normal, normal heart sounds and normal pulses.  No LE edema  Pulmonary/Chest: Effort normal and breath sounds normal.  Neurological: She is alert. GCS eye subscore is 4. GCS verbal subscore is 5. GCS motor subscore is 6.  Skin: Skin is warm, dry and intact.  Psychiatric: She has a normal mood and affect. Her speech is normal and behavior is normal.  Nursing note and vitals reviewed.    Assessment and Plan:    Aldora was seen today for hypertension f/u.  Diagnoses and all orders for this visit:  Hypertension, unspecified type Currently well controlled.  We are going to have her take her metoprolol every other day for 1 week and then stop.  If needed we can always increase her lisinopril-hydrochlorthiazide.  Patient verbalized understanding.  We discussed that weaning off metoprolol may cause a few side effects but she is on a relatively low dose so this should be pretty mild.  I asked her to call  our office if she experiences any side effects that are concerning.  Follow-up in 1 month. -     CBC with Differential/Platelet -     Basic metabolic panel  Anemia, unspecified type Recheck today. -     CBC with Differential/Platelet  Bipolar 1 disorder ([place]) We had a long discussion about getting back in with her counselor and psychiatrist.  I recommended that she speak with her counselor about a recommendation for a different psychiatrist, as she had felt more controlled on Latuda.  I also provided her with a list of other psychiatrists in the area for her to reach out to if needed.  Patient verbalized understanding.  Denied any suicidal or homicidal thoughts today.   . Reviewed expectations re: course of current medical issues. . Discussed self-management of symptoms. . Outlined signs and symptoms indicating need for more acute intervention. . Patient verbalized understanding and all questions were answered. . See orders for this visit as documented in the electronic medical record. . Patient received an After Visit Summary.  CMA or LPN served as scribe during this visit. History, Physical, and Plan performed by medical provider. Documentation and orders reviewed and attested to.   [name], PA-C [place] 03/08/2018  Follow-up: Return in about 1 month (around 04/07/2018) for Blood Pressure F/U, Medication F/U.

## 2018-03-10 DIAGNOSIS — F3181 Bipolar II disorder: Secondary | ICD-10-CM | POA: Diagnosis not present

## 2018-03-30 ENCOUNTER — Institutional Professional Consult (permissible substitution): Payer: Self-pay | Admitting: Neurology

## 2018-04-07 ENCOUNTER — Ambulatory Visit: Payer: PPO | Admitting: Physician Assistant

## 2018-04-07 NOTE — Progress Notes (Deleted)
[name] is a 52 y.o. female is here to discuss:  I acted as a Education administrator for [employer], PA-C Anselmo Pickler, LPN  History of Present Illness:   No chief complaint on file.   Hypertension  This is a chronic problem. The current episode started more than 1 year ago.    Health Maintenance Due  Topic Date Due  . HIV Screening  12/31/1982  . COLONOSCOPY  12/30/2017    Past Medical History:  Diagnosis Date  . Bipolar 1 disorder ([place])   . History of chicken pox   . Hypertension   . Vitamin D deficiency      Social History   Socioeconomic History  . Marital status: Single    Spouse name: Not on file  . Number of children: Not on file  . Years of education: Not on file  . Highest education level: Not on file  Occupational History  . Not on file  Social Needs  . Financial resource strain: Not on file  . Food insecurity:    Worry: Not on file    Inability: Not on file  . Transportation needs:    Medical: Not on file    Non-medical: Not on file  Tobacco Use  . Smoking status: Never Smoker  . Smokeless tobacco: Never Used  Substance and Sexual Activity  . Alcohol use: Yes    Alcohol/week: 0.6 - 1.2 oz    Types: 1 - 2 Glasses of wine per week  . Drug use: No  . Sexual activity: Never  Lifestyle  . Physical activity:    Days per week: Not on file    Minutes per session: Not on file  . Stress: Not on file  Relationships  . Social connections:    Talks on phone: Not on file    Gets together: Not on file    Attends religious service: Not on file    Active member of club or organization: Not on file    Attends meetings of clubs or organizations: Not on file    Relationship status: Not on file  . Intimate partner violence:    Fear of current or ex partner: Not on file    Emotionally abused: Not on file    Physically abused: Not on file    Forced sexual activity: Not on file  Other Topics Concern  . Not on file  Social History Narrative   Lives in  [place], was living in [place], [place] for 20 years   No children, not married   Single   Family is nearby, mom and sister nearby   Currently on disability: 4 years ago she had an episode of psychosis (states that she didn't sleep for ~6-8 days prior to) and was in and out of mental institution for 6 months, lost her job and had to move back to [place] to be near family    Past Surgical History:  Procedure Laterality Date  . BREAST REDUCTION SURGERY  1986  . Heart defect surgery    . TONSILLECTOMY      Family History  Problem Relation Age of Onset  . Diabetes Mother   . Hypertension Mother   . Stroke Father   . Diabetes Maternal Grandmother   . CVA Maternal Grandmother   . Diabetes Maternal Grandfather   . Colon cancer Paternal Grandmother   . Diabetes Paternal Grandfather   . Breast cancer Maternal Aunt     PMHx, SurgHx, SocialHx, FamHx, Medications, and Allergies  were reviewed in the Visit Navigator and updated as appropriate.   Patient Active Problem List   Diagnosis Date Noted  . Vitamin D deficiency   . Sleep apnea 03/31/2017  . Morbid obesity ([place]) 03/31/2017  . Hypertension 03/31/2017  . Bipolar 1 disorder ([place]) 03/31/2017    Social History   Tobacco Use  . Smoking status: Never Smoker  . Smokeless tobacco: Never Used  Substance Use Topics  . Alcohol use: Yes    Alcohol/week: 0.6 - 1.2 oz    Types: 1 - 2 Glasses of wine per week  . Drug use: No    Current Medications and Allergies:    Current Outpatient Medications:  .  ARIPiprazole (ABILIFY) 5 MG tablet, Take 5 mg by mouth at bedtime, Disp: , Rfl: 1 .  doxepin (SINEQUAN) 25 MG capsule, Take 50 mg by mouth at bedtime, Disp: , Rfl: 1 .  lisinopril-hydrochlorothiazide (PRINZIDE,ZESTORETIC) 10-12.5 MG tablet, Take 1 tablet by mouth daily., Disp: 30 tablet, Rfl: 1 .  metFORMIN (GLUCOPHAGE) 500 MG tablet, Take 1 tablet (500 mg total) by mouth daily with breakfast., Disp: 30 tablet, Rfl: 2  No Known  Allergies  Review of Systems   ROS  Vitals:  There were no vitals filed for this visit.   There is no height or weight on file to calculate BMI.   Physical Exam:    Physical Exam   Assessment and Plan:    There are no diagnoses linked to this encounter.  . Reviewed expectations re: course of current medical issues. . Discussed self-management of symptoms. . Outlined signs and symptoms indicating need for more acute intervention. . Patient verbalized understanding and all questions were answered. . See orders for this visit as documented in the electronic medical record. . Patient received an After Visit Summary.  ***  [name], PA-C [place] 04/07/2018  Follow-up: No follow-ups on file.

## 2018-04-10 ENCOUNTER — Encounter: Payer: Self-pay | Admitting: Physician Assistant

## 2018-04-10 ENCOUNTER — Ambulatory Visit (INDEPENDENT_AMBULATORY_CARE_PROVIDER_SITE_OTHER): Payer: PPO | Admitting: Physician Assistant

## 2018-04-10 VITALS — BP 148/100 | HR 102 | Temp 98.9°F | Ht 60.0 in | Wt 320.0 lb

## 2018-04-10 DIAGNOSIS — Z114 Encounter for screening for human immunodeficiency virus [HIV]: Secondary | ICD-10-CM

## 2018-04-10 DIAGNOSIS — G8929 Other chronic pain: Secondary | ICD-10-CM | POA: Insufficient documentation

## 2018-04-10 DIAGNOSIS — I10 Essential (primary) hypertension: Secondary | ICD-10-CM

## 2018-04-10 DIAGNOSIS — M25569 Pain in unspecified knee: Secondary | ICD-10-CM

## 2018-04-10 DIAGNOSIS — E8881 Metabolic syndrome: Secondary | ICD-10-CM | POA: Insufficient documentation

## 2018-04-10 LAB — BASIC METABOLIC PANEL
BUN: 7 mg/dL (ref 6–23)
CALCIUM: 9.1 mg/dL (ref 8.4–10.5)
CHLORIDE: 102 meq/L (ref 96–112)
CO2: 30 mEq/L (ref 19–32)
CREATININE: 0.59 mg/dL (ref 0.40–1.20)
GFR: 138.6 mL/min (ref >60.00)
Glucose, Bld: 101 mg/dL — ABNORMAL HIGH (ref 70–99)
Potassium: 4.7 mEq/L (ref 3.5–5.1)
SODIUM: 139 meq/L (ref 135–145)

## 2018-04-10 MED ORDER — LISINOPRIL-HYDROCHLOROTHIAZIDE 20-25 MG PO TABS: 1.0000 | ORAL_TABLET | Freq: Every day | ORAL | 1 refills | Status: DC

## 2018-04-10 NOTE — Progress Notes (Signed)
[name] is a 52 y.o. female is here to discuss: Hypertension  I acted as a Education administrator for [employer], PA-C Anselmo Pickler, LPN  History of Present Illness:   Chief Complaint  Patient presents with  . Hypertension    Hypertension  This is a chronic problem. The current episode started more than 1 year ago (Pt here for follow up on blood pressure. Pt checking Bp once a day. Blood pressure ranging 619-509 systolic and diastolic  32-67. since last visit ). The problem has been waxing and waning since onset. The problem is uncontrolled. Associated symptoms include headaches, malaise/fatigue, peripheral edema (hands) and shortness of breath (with exertion). Pertinent negatives include no anxiety, blurred vision, chest pain or palpitations. Risk factors for coronary artery disease include obesity. Treatments tried: Pt was on Metoprolol and was changed 2 months ago. The current treatment provides mild improvement. Compliance problems: Pt said has missed a day or two. No side effects.    Wt Readings from Last 15 Encounters:  04/10/18 (!) 320 lb (145.2 kg)  03/08/18 (!) 317 lb 6.1 oz (144 kg)  02/08/18 (!) 317 lb (143.8 kg)  12/06/17 (!) 316 lb (143.3 kg)  05/25/17 (!) 314 lb 9.6 oz (142.7 kg)  03/31/17 (!) 312 lb (141.5 kg)   Planning to meet with the neurologist in a few weeks to discuss sleep test.  She also needs a form completed today to proceed for consultation with the bariatric surgeon.   Health Maintenance Due  Topic Date Due  . HIV Screening  12/31/1982    Past Medical History:  Diagnosis Date  . Bipolar 1 disorder ([place])   . History of chicken pox   . Hypertension   . Vitamin D deficiency      Social History   Socioeconomic History  . Marital status: Single    Spouse name: Not on file  . Number of children: Not on file  . Years of education: Not on file  . Highest education level: Not on file  Occupational History  . Not on file  Social Needs  . Financial  resource strain: Not on file  . Food insecurity:    Worry: Not on file    Inability: Not on file  . Transportation needs:    Medical: Not on file    Non-medical: Not on file  Tobacco Use  . Smoking status: Never Smoker  . Smokeless tobacco: Never Used  Substance and Sexual Activity  . Alcohol use: Yes    Alcohol/week: 0.6 - 1.2 oz    Types: 1 - 2 Glasses of wine per week  . Drug use: No  . Sexual activity: Never  Lifestyle  . Physical activity:    Days per week: Not on file    Minutes per session: Not on file  . Stress: Not on file  Relationships  . Social connections:    Talks on phone: Not on file    Gets together: Not on file    Attends religious service: Not on file    Active member of club or organization: Not on file    Attends meetings of clubs or organizations: Not on file    Relationship status: Not on file  . Intimate partner violence:    Fear of current or ex partner: Not on file    Emotionally abused: Not on file    Physically abused: Not on file    Forced sexual activity: Not on file  Other Topics Concern  .  Not on file  Social History Narrative   Lives in [place], was living in [place], [place] for 20 years   No children, not married   Single   Family is nearby, mom and sister nearby   Currently on disability: 4 years ago she had an episode of psychosis (states that she didn't sleep for ~6-8 days prior to) and was in and out of mental institution for 6 months, lost her job and had to move back to [place] to be near family    Past Surgical History:  Procedure Laterality Date  . BREAST REDUCTION SURGERY  1986  . Heart defect surgery    . TONSILLECTOMY      Family History  Problem Relation Age of Onset  . Diabetes Mother   . Hypertension Mother   . Stroke Father   . Diabetes Maternal Grandmother   . CVA Maternal Grandmother   . Diabetes Maternal Grandfather   . Colon cancer Paternal Grandmother   . Diabetes Paternal Grandfather   . Breast cancer  Maternal Aunt     PMHx, SurgHx, SocialHx, FamHx, Medications, and Allergies were reviewed in the Visit Navigator and updated as appropriate.   Patient Active Problem List   Diagnosis Date Noted  . Knee pain, chronic 04/10/2018  . Insulin resistance 04/10/2018  . Vitamin D deficiency   . Sleep apnea 03/31/2017  . Morbid obesity ([place]) 03/31/2017  . Hypertension 03/31/2017  . Bipolar 1 disorder ([place]) 03/31/2017    Social History   Tobacco Use  . Smoking status: Never Smoker  . Smokeless tobacco: Never Used  Substance Use Topics  . Alcohol use: Yes    Alcohol/week: 0.6 - 1.2 oz    Types: 1 - 2 Glasses of wine per week  . Drug use: No    Current Medications and Allergies:    Current Outpatient Medications:  .  ARIPiprazole (ABILIFY) 5 MG tablet, Take 5 mg by mouth at bedtime, Disp: , Rfl: 1 .  doxepin (SINEQUAN) 25 MG capsule, Take 50 mg by mouth at bedtime, Disp: , Rfl: 1 .  metFORMIN (GLUCOPHAGE) 500 MG tablet, Take 1 tablet (500 mg total) by mouth daily with breakfast., Disp: 30 tablet, Rfl: 2 .  lisinopril-hydrochlorothiazide (PRINZIDE,ZESTORETIC) 20-25 MG tablet, Take 1 tablet by mouth daily., Disp: 30 tablet, Rfl: 1  No Known Allergies  Review of Systems   Review of Systems  Constitutional: Positive for malaise/fatigue.  Eyes: Negative for blurred vision.  Respiratory: Positive for shortness of breath (with exertion).   Cardiovascular: Negative for chest pain and palpitations.  Neurological: Positive for headaches.    Vitals:   Vitals:   04/10/18 1321 04/10/18 1348  BP: (!) 140/100 (!) 148/100  Pulse: (!) 105 (!) 102  Temp: 98.9 F (37.2 C)   TempSrc: Oral   SpO2: 95%   Weight: (!) 320 lb (145.2 kg)   Height: 5' (1.524 m)      Body mass index is 62.5 kg/m.   Physical Exam:    Physical Exam  Constitutional: She appears well-developed. She is cooperative.  Non-toxic appearance. She does not have a sickly appearance. She does not appear ill. No  distress.  Cardiovascular: Normal rate, regular rhythm, S1 normal, S2 normal, normal heart sounds and normal pulses.  No LE edema  Pulmonary/Chest: Effort normal and breath sounds normal.  Neurological: She is alert. GCS eye subscore is 4. GCS verbal subscore is 5. GCS motor subscore is 6.  Skin: Skin is warm, dry and intact.  Psychiatric: She has a normal mood and affect. Her speech is normal and behavior is normal.  Nursing note and vitals reviewed.    Assessment and Plan:    Problem List Items Addressed This Visit      Cardiovascular and Mediastinum   Hypertension - Primary    Increase Prinzide -- Lisinopril 20 mg + HCTZ 25 mg. Suspect underlying OSA is contributing. Follow-up in 1 month. BMP today.      Relevant Medications   lisinopril-hydrochlorothiazide (PRINZIDE,ZESTORETIC) 20-25 MG tablet   Other Relevant Orders   Basic metabolic panel    Other Visit Diagnoses    Screening for HIV (human immunodeficiency virus)       Relevant Orders   HIV antibody     . Reviewed expectations re: course of current medical issues. . Discussed self-management of symptoms. . Outlined signs and symptoms indicating need for more acute intervention. . Patient verbalized understanding and all questions were answered. . See orders for this visit as documented in the electronic medical record. . Patient received an After Visit Summary.  CMA or LPN served as scribe during this visit. History, Physical, and Plan performed by medical provider. Documentation and orders reviewed and attested to.   [name], PA-C [place] 04/10/2018  Follow-up: Return in about 1 month (around 05/11/2018) for Blood Pressure F/U.

## 2018-04-10 NOTE — Patient Instructions (Signed)
It was great to see you!  Increase your blood pressure medication dosage to Lisinopril 20- HCTZ 25.  Follow-up in 1 month for flood pressure.

## 2018-04-10 NOTE — Assessment & Plan Note (Signed)
Increase Prinzide -- Lisinopril 20 mg + HCTZ 25 mg. Suspect underlying OSA is contributing. Follow-up in 1 month. BMP today.

## 2018-04-11 LAB — HIV ANTIBODY (ROUTINE TESTING W REFLEX): HIV 1&2 Ab, 4th Generation: NONREACTIVE

## 2018-04-20 ENCOUNTER — Encounter: Payer: Self-pay | Admitting: Neurology

## 2018-04-20 ENCOUNTER — Ambulatory Visit (INDEPENDENT_AMBULATORY_CARE_PROVIDER_SITE_OTHER): Payer: PPO | Admitting: Neurology

## 2018-04-20 VITALS — BP 160/88 | HR 102 | Ht 60.0 in | Wt 314.0 lb

## 2018-04-20 DIAGNOSIS — Z789 Other specified health status: Secondary | ICD-10-CM | POA: Diagnosis not present

## 2018-04-20 DIAGNOSIS — Z6841 Body Mass Index (BMI) 40.0 and over, adult: Secondary | ICD-10-CM | POA: Diagnosis not present

## 2018-04-20 DIAGNOSIS — G4733 Obstructive sleep apnea (adult) (pediatric): Secondary | ICD-10-CM

## 2018-04-20 DIAGNOSIS — G4719 Other hypersomnia: Secondary | ICD-10-CM | POA: Diagnosis not present

## 2018-04-20 DIAGNOSIS — R351 Nocturia: Secondary | ICD-10-CM | POA: Diagnosis not present

## 2018-04-20 NOTE — Progress Notes (Signed)
Subjective:    [name] ID: Dana [name] is a 52 y.o. female.  HPI    Dana Age, MD, PhD [facility] [address] [place], [place] [address]  Dear Aldona Bar,  I saw your [name], Dana [name], upon your kind request in my neurologic clinic today for initial consultation of Dana [name] sleep disorder, in particular, concern for underlying obstructive sleep apnea. Dana [name] is unaccompanied today. As you know, Dana [name] is a 52 year old right-handed woman with an underlying medical history of hypertension, mood disorder, vitamin D deficiency and morbid obesity with a BMI of over 60, who reports snoring and excessive daytime somnolence. I reviewed your office note from 02/08/2018 as well as 04/10/2018. Dana [name] has been referred to bariatric surgery as well. Dana [name] was previously diagnosed with sleep apnea but could not tolerate CPAP in Dana past, sleep study testing was years ago, results are not available for my review today, in [place]. Dana [name] Epworth sleepiness score is 7 out of 24, fatigue score is 60 out of 63.Dana [name] is single and lives alone, Dana [name] has no children, on disability for 5+ years. Dana [name] is a nonsmoker and does not utilize alcohol on a regular basis, drinks caffeine in Dana form of coffee, about 2 cups per day in Dana morning and soda as well as tea.Dana [name] does not keep a regular schedule for Dana [name] bedtime and rise time. Dana [name] tries to be in bed between 10 and midnight typically but does not fall asleep for hours. Dana [name] tries to fall asleep before Dana sun comes up. Rise time varies depending on when Dana [name] fell asleep. Dana [name] has a TV in Dana [name] bedroom and tries to turn it off before Dana [name] falls asleep. Dana [name] has no pets.Dana [name] had a tonsillectomy as an adult, in Dana [name] 4s or 42s. Dana [name] has to go to Dana bathroom more than twice per average night.  Dana [name] Past Medical History Is Significant For: Past Medical History:  Diagnosis Date  . Bipolar 1 disorder ([place])   . History of chicken pox   .  Hypertension   . Vitamin D deficiency     Dana [name] Past Surgical History Is Significant For: Past Surgical History:  Procedure Laterality Date  . BREAST REDUCTION SURGERY  1986  . Heart defect surgery    . TONSILLECTOMY      Dana [name] Family History Is Significant For: Family History  Problem Relation [name] of Onset  . Diabetes Mother   . Hypertension Mother   . Stroke Father   . Diabetes Maternal Grandmother   . CVA Maternal Grandmother   . Diabetes Maternal Grandfather   . Colon cancer Paternal Grandmother   . Diabetes Paternal Grandfather   . Breast cancer Maternal Aunt     Dana [name] Social History Is Significant For: Social History   Socioeconomic History  . Marital status: Single    Spouse name: Not on file  . Number of children: Not on file  . Years of education: Not on file  . Highest education level: Not on file  Occupational History  . Not on file  Social Needs  . Financial resource strain: Not on file  . Food insecurity:    Worry: Not on file    Inability: Not on file  . Transportation needs:    Medical: Not on file    Non-medical: Not on file  Tobacco Use  . Smoking status: Never Smoker  . Smokeless tobacco: Never Used  Substance and Sexual Activity  . Alcohol use: Yes  Alcohol/week: 0.6 - 1.2 oz    Types: 1 - 2 Glasses of wine per week  . Drug use: No  . Sexual activity: Never  Lifestyle  . Physical activity:    Days per week: Not on file    Minutes per session: Not on file  . Stress: Not on file  Relationships  . Social connections:    Talks on phone: Not on file    Gets together: Not on file    Attends religious service: Not on file    Active member of club or organization: Not on file    Attends meetings of clubs or organizations: Not on file    Relationship status: Not on file  Other Topics Concern  . Not on file  Social History Narrative   Lives in [place], was living in [place], [place] for 20 years   No children, not married   Single   Family  is nearby, mom and sister nearby   Currently on disability: 4 years ago Dana [name] had an episode of psychosis (states that Dana [name] didn't sleep for ~6-8 days prior to) and was in and out of mental institution for 6 months, lost Dana [name] job and had to move back to [place] to be near family    Dana [name] Allergies Are:  No Known Allergies:   Dana [name] Current Medications Are:  Outpatient Encounter Medications as of 04/20/2018  Medication Sig  . ARIPiprazole (ABILIFY) 5 MG tablet Take 5 mg by mouth at bedtime  . doxepin (SINEQUAN) 25 MG capsule Take 50 mg by mouth at bedtime  . lisinopril-hydrochlorothiazide (PRINZIDE,ZESTORETIC) 20-25 MG tablet Take 1 tablet by mouth daily.  . metFORMIN (GLUCOPHAGE) 500 MG tablet Take 1 tablet (500 mg total) by mouth daily with breakfast.   No facility-administered encounter medications on file as of 04/20/2018.   :  Review of Systems:  Out of a complete 14 point review of systems, all are reviewed and negative with Dana exception of these symptoms as listed below: Review of Systems  Neurological:       Pt presents today to discuss Dana [name] sleep. Pt has had a sleep study a long time ago but couldn't tolerate a cpap. Pt does endorse snoring.  Epworth Sleepiness Scale 0= would never doze 1= slight chance of dozing 2= moderate chance of dozing 3= high chance of dozing  Sitting and reading: 1 Watching TV: 2 Sitting inactive in a public place (ex. Theater or meeting): 0 As a passenger in a car for an hour without a break: 1 Lying down to rest in Dana afternoon: 3 Sitting and talking to someone: 0 Sitting quietly after lunch (no alcohol): 0 In a car, while stopped in traffic: 0 Total: 7     Objective:  Neurological Exam  Physical Exam Physical Examination:   Vitals:   04/20/18 1103  BP: (!) 160/88  Pulse: (!) 102    General Examination: Dana [name] is a very pleasant 52 y.o. female in no acute distress. Dana [name] appears well-developed and well-nourished and well groomed.    HEENT: Normocephalic, atraumatic, pupils are equal, round and reactive to light and accommodation. Extraocular tracking is good without limitation to gaze excursion or nystagmus noted. Normal smooth pursuit is noted. Hearing is grossly intact. Face is symmetric with normal facial animation and normal facial sensation. Speech is clear with no dysarthria noted. There is no hypophonia. There is no lip, neck/head, jaw or voice tremor. Neck is supple with full range of passive and active motion. There are  no carotid bruits on auscultation. Oropharynx exam reveals: moderate mouth dryness, adequate dental hygiene and moderate airway crowding, due to smaller airway entry and redundant soft palate, wider tongue.. Mallampati is class II. Tongue protrudes centrally and palate elevates symmetrically. Tonsils are absent. Neck size is 17.25 inches.   Chest: Clear to auscultation without wheezing, rhonchi or crackles noted.  Heart: S1+S2+0, regular and normal without murmurs, rubs or gallops noted.   Abdomen: Soft, non-tender and non-distended with normal bowel sounds appreciated on auscultation.  Extremities: There is no edema.  Skin: Warm and dry without trophic changes noted.  Musculoskeletal: exam reveals no obvious joint deformities, tenderness or joint swelling or erythema.   Neurologically:  Mental status: Dana [name] is awake, alert and oriented in all 4 spheres. Dana [name] immediate and remote memory, attention, language skills and fund of knowledge are appropriate. There is no evidence of aphasia, agnosia, apraxia or anomia. Speech is clear with normal prosody and enunciation. Thought process is linear. Mood is normal and affect is normal.  Cranial nerves II - XII are as described above under HEENT exam. In addition: shoulder shrug is normal with equal shoulder height noted. Motor exam: Normal bulk, strength and tone is noted. There is no drift, tremor or rebound. Romberg is negative. Fine motor skills and  coordination: grossly intact.  Cerebellar testing: No dysmetria or intention tremor.  Sensory exam: intact to light touch in Dana upper and lower extremities. Reports tingling and burning in both thighs. Gait, station and balance: Dana [name] stands with mild difficulty. Dana [name] stands statically wider based. Tandem walk is not possible secondary to body habitus. Dana [name] walks slowly and cautiously.  Assessment and Plan:  In summary, Nirel Babler Hipolito is a very pleasant 52 y.o.-year old female with an underlying medical history of hypertension, mood disorder, vitamin D deficiency and morbid obesity with a BMI of over 46, who Presents for sleep evaluation with a prior diagnosis of obstructive sleep apnea (OSA). I had a long chat with Dana [name] about my findings and Dana diagnosis of OSA, its prognosis and treatment options. We talked about medical treatments, surgical interventions and non-pharmacological approaches. I explained in particular Dana risks and ramifications of untreated moderate to severe OSA, especially with respect to developing cardiovascular disease down [place] [address], including congestive heart failure, difficult to treat hypertension, cardiac arrhythmias, or stroke. Even type 2 diabetes has, in part, been linked to untreated OSA. Symptoms of untreated OSA include daytime sleepiness, memory problems, mood irritability and mood disorder such as depression and anxiety, lack of energy, as well as recurrent headaches, especially morning headaches. We talked about trying to maintain a healthy lifestyle in general, as well as Dana importance of weight control. I encouraged Dana [name] to eat healthy, exercise daily and keep well hydrated, to keep a scheduled bedtime and wake time routine, to not skip any meals and eat healthy snacks in between meals. I advised Dana [name] not to drive when feeling sleepy. I recommended Dana following at this time: sleep study with potential positive airway pressure titration. (We will score  hypopneas at 4%).   I explained Dana sleep test procedure to Dana [name] and also outlined possible surgical and non-surgical treatment options of OSA, including Dana use of a custom-made dental device (which would require a referral to a specialist dentist or oral surgeon), upper airway surgical options, such as pillar implants, radiofrequency surgery, tongue base surgery, and UPPP (which would involve a referral to an ENT surgeon). Rarely, jaw surgery such as  mandibular advancement may be considered.  I also explained Dana CPAP treatment option to Dana [name], who indicated that Dana [name] would be willing to try CPAP if Dana need arises. I explained Dana importance of being compliant with PAP treatment, not only for insurance purposes but primarily to improve Dana [name] symptoms, and for Dana [name]'s long term health benefit, including to reduce Dana [name] cardiovascular risks. I answered all Dana [name] questions today and Dana [name] was in agreement. I plan to see Dana [name] back after Dana sleep study is completed and encouraged Dana [name] to call with any interim questions, concerns, problems or updates.   Thank you very much for allowing me to participate in Dana care of this nice [name]. If I can be of any further assistance to you please do not hesitate to call me at [phone].  Sincerely,   Dana Age, MD, PhD

## 2018-04-20 NOTE — Patient Instructions (Signed)
Thank you for choosing [facility] for your sleep related care! It was nice to meet you today! I appreciate that you entrust me with your sleep related healthcare concerns. I hope, I was able to address at least some of your concerns today, and that I can help you feel reassured and also get better.    Here is what we discussed today and what we came up with as our plan for you:    Based on your symptoms and your exam I believe you are still at risk for obstructive sleep apnea and would benefit from re-evaluation as it has been some years and you may tolerate CPAP this time around, if you qualify. Therefore, I think we should proceed with a sleep study to determine how severe your sleep apnea is. If you have more than mild OSA, I want you to consider ongoing treatment with CPAP. Please remember, the risks and ramifications of moderate to severe obstructive sleep apnea or OSA are: Cardiovascular disease, including congestive heart failure, stroke, difficult to control hypertension, arrhythmias, and even type 2 diabetes has been linked to untreated OSA. Sleep apnea causes disruption of sleep and sleep deprivation in most cases, which, in turn, can cause recurrent headaches, problems with memory, mood, concentration, focus, and vigilance. Most people with untreated sleep apnea report excessive daytime sleepiness, which can affect their ability to drive. Please do not drive if you feel sleepy.   I will likely see you back after your sleep study to go over the test results and where to go from there. We will call you after your sleep study to advise about the results (most likely, you will hear from [place], my nurse) and to set up an appointment at the time, as necessary.    Our sleep lab administrative assistant will call you to schedule your sleep study. If you don't hear back from her by about 2 weeks from now, please feel free to call her at [phone]. You can leave a message with your  phone number and concerns, if you get the voicemail box. She will call back as soon as possible.

## 2018-04-26 ENCOUNTER — Telehealth: Payer: Self-pay

## 2018-04-26 DIAGNOSIS — G4733 Obstructive sleep apnea (adult) (pediatric): Secondary | ICD-10-CM

## 2018-04-26 NOTE — Telephone Encounter (Signed)
Heath team advantage denied in lab sleep study, ned HST order

## 2018-04-26 NOTE — Telephone Encounter (Signed)
VO for HST from Dr. Athar received. HST order placed.  

## 2018-05-02 ENCOUNTER — Other Ambulatory Visit: Payer: Self-pay | Admitting: Physician Assistant

## 2018-05-11 ENCOUNTER — Ambulatory Visit: Payer: PPO | Admitting: Physician Assistant

## 2018-05-12 ENCOUNTER — Ambulatory Visit (INDEPENDENT_AMBULATORY_CARE_PROVIDER_SITE_OTHER): Payer: PPO | Admitting: Physician Assistant

## 2018-05-12 ENCOUNTER — Encounter: Payer: Self-pay | Admitting: Physician Assistant

## 2018-05-12 VITALS — BP 130/96 | HR 76 | Temp 98.4°F | Ht 60.0 in | Wt 317.0 lb

## 2018-05-12 DIAGNOSIS — I10 Essential (primary) hypertension: Secondary | ICD-10-CM | POA: Diagnosis not present

## 2018-05-12 DIAGNOSIS — G4733 Obstructive sleep apnea (adult) (pediatric): Secondary | ICD-10-CM

## 2018-05-12 DIAGNOSIS — Z23 Encounter for immunization: Secondary | ICD-10-CM

## 2018-05-12 NOTE — Progress Notes (Signed)
[name] is a 52 y.o. female is here to discuss: Hypertension  I acted as a Education administrator for [employer], PA-C Anselmo Pickler, LPN  History of Present Illness:   Chief Complaint  Patient presents with  . Hypertension    Hypertension  This is a chronic problem. Episode onset: Pt here for follow up on blood pressure. Pt checking blood pressure 3 times a day. Has not checked in the  past 2 days. Blood pressure ranging Systolic 981-191, Diastolic 47-82. The problem has been gradually improving since onset. The problem is uncontrolled. Associated symptoms include malaise/fatigue. Pertinent negatives include no blurred vision, chest pain, headaches, neck pain, palpitations, peripheral edema or shortness of breath. Risk factors for coronary artery disease include diabetes mellitus and obesity. The current treatment provides mild improvement. There are no compliance problems.  Identifiable causes of hypertension include sleep apnea.   BP Readings from Last 3 Encounters:  05/12/18 (!) 130/96  04/20/18 (!) 160/88  04/10/18 (!) 148/100   She had an appointment with the neurologist, but has yet to schedule her sleep study.  She has not taken her blood pressure medicine today.  There are no preventive care reminders to display for this patient.  Past Medical History:  Diagnosis Date  . Bipolar 1 disorder ([place])   . History of chicken pox   . Hypertension   . Vitamin D deficiency      Social History   Socioeconomic History  . Marital status: Single    Spouse name: Not on file  . Number of children: Not on file  . Years of education: Not on file  . Highest education level: Not on file  Occupational History  . Not on file  Social Needs  . Financial resource strain: Not on file  . Food insecurity:    Worry: Not on file    Inability: Not on file  . Transportation needs:    Medical: Not on file    Non-medical: Not on file  Tobacco Use  . Smoking status: Never Smoker  .  Smokeless tobacco: Never Used  Substance and Sexual Activity  . Alcohol use: Yes    Alcohol/week: 1.0 - 2.0 standard drinks    Types: 1 - 2 Glasses of wine per week  . Drug use: No  . Sexual activity: Never  Lifestyle  . Physical activity:    Days per week: Not on file    Minutes per session: Not on file  . Stress: Not on file  Relationships  . Social connections:    Talks on phone: Not on file    Gets together: Not on file    Attends religious service: Not on file    Active member of club or organization: Not on file    Attends meetings of clubs or organizations: Not on file    Relationship status: Not on file  . Intimate partner violence:    Fear of current or ex partner: Not on file    Emotionally abused: Not on file    Physically abused: Not on file    Forced sexual activity: Not on file  Other Topics Concern  . Not on file  Social History Narrative   Lives in [place], was living in [place], [place] for 20 years   No children, not married   Single   Family is nearby, mom and sister nearby   Currently on disability: 4 years ago she had an episode of psychosis (states that she didn't sleep for ~  6-8 days prior to) and was in and out of mental institution for 6 months, lost her job and had to move back to [place] to be near family    Past Surgical History:  Procedure Laterality Date  . BREAST REDUCTION SURGERY  1986  . Heart defect surgery    . TONSILLECTOMY      Family History  Problem Relation Age of Onset  . Diabetes Mother   . Hypertension Mother   . Stroke Father   . Diabetes Maternal Grandmother   . CVA Maternal Grandmother   . Diabetes Maternal Grandfather   . Colon cancer Paternal Grandmother   . Diabetes Paternal Grandfather   . Breast cancer Maternal Aunt     PMHx, SurgHx, SocialHx, FamHx, Medications, and Allergies were reviewed in the Visit Navigator and updated as appropriate.   Patient Active Problem List   Diagnosis Date Noted  . Knee pain, chronic  04/10/2018  . Insulin resistance 04/10/2018  . Vitamin D deficiency   . Sleep apnea 03/31/2017  . Morbid obesity ([place]) 03/31/2017  . Hypertension 03/31/2017  . Bipolar 1 disorder ([place]) 03/31/2017    Social History   Tobacco Use  . Smoking status: Never Smoker  . Smokeless tobacco: Never Used  Substance Use Topics  . Alcohol use: Yes    Alcohol/week: 1.0 - 2.0 standard drinks    Types: 1 - 2 Glasses of wine per week  . Drug use: No    Current Medications and Allergies:    Current Outpatient Medications:  .  ARIPiprazole (ABILIFY) 5 MG tablet, Take 5 mg by mouth at bedtime, Disp: , Rfl: 1 .  doxepin (SINEQUAN) 25 MG capsule, Take 50 mg by mouth at bedtime, Disp: , Rfl: 1 .  lisinopril-hydrochlorothiazide (PRINZIDE,ZESTORETIC) 20-25 MG tablet, Take 1 tablet by mouth daily., Disp: 30 tablet, Rfl: 1 .  metFORMIN (GLUCOPHAGE) 500 MG tablet, TAKE 1 TABLET BY MOUTH EVERY DAY WITH BREAKFAST, Disp: 30 tablet, Rfl: 0  No Known Allergies  Review of Systems   Review of Systems  Constitutional: Positive for malaise/fatigue.  Eyes: Negative for blurred vision.  Respiratory: Negative for shortness of breath.   Cardiovascular: Negative for chest pain and palpitations.  Musculoskeletal: Negative for neck pain.  Neurological: Negative for headaches.    Vitals:   Vitals:   05/12/18 0931  BP: (!) 130/96  Pulse: 76  Temp: 98.4 F (36.9 C)  TempSrc: Oral  SpO2: 97%  Weight: (!) 317 lb (143.8 kg)  Height: 5' (1.524 m)     Body mass index is 61.91 kg/m.   Physical Exam:    Physical Exam  Constitutional: She appears well-developed. She is cooperative.  Non-toxic appearance. She does not have a sickly appearance. She does not appear ill. No distress.  Cardiovascular: Normal rate, regular rhythm, S1 normal, S2 normal, normal heart sounds and normal pulses.  No LE edema  Pulmonary/Chest: Effort normal and breath sounds normal.  Neurological: She is alert. GCS eye subscore is 4.  GCS verbal subscore is 5. GCS motor subscore is 6.  Skin: Skin is warm, dry and intact.  Psychiatric: She has a normal mood and affect. Her speech is normal and behavior is normal.  Nursing note and vitals reviewed.    Assessment and Plan:    Akemi was seen today for hypertension.  Diagnoses and all orders for this visit:  Hypertension, unspecified type  Need for prophylactic vaccination with combined diphtheria-tetanus-pertussis (DTP) vaccine -     Tdap vaccine greater  than or equal to 7yo IM   Currently uncontrolled, but is improved from last visit.  She has not taken her blood pressure medicine today. We discussed starting Norvasc 5 mg, however she would like to defer this at this time. She would like to wait to start and additional BP medication after she has her sleep study, she states that she is going to call to schedule today.  Follow-up with me in 4 to 6 weeks so we can discuss and review blood pressure, sooner if symptoms.  She continues to work through the process of scheduling bariatric surgery, continue to encourage at each visit.   . Reviewed expectations re: course of current medical issues. . Discussed self-management of symptoms. . Outlined signs and symptoms indicating need for more acute intervention. . Patient verbalized understanding and all questions were answered. . See orders for this visit as documented in the electronic medical record. . Patient received an After Visit Summary.  CMA or LPN served as scribe during this visit. History, Physical, and Plan performed by medical provider. Documentation and orders reviewed and attested to.   [name], PA-C [place] 05/12/2018  Follow-up: No follow-ups on file.

## 2018-05-12 NOTE — Patient Instructions (Signed)
It was great to see you!  Continue blood pressure regimen. Check blood pressure every other day.  Let's follow-up in 1-2 months, sooner if you have concerns.  Take care,  [name] PA-C

## 2018-05-30 ENCOUNTER — Telehealth: Payer: Self-pay

## 2018-05-30 NOTE — Telephone Encounter (Signed)
We have attempted to call the patient two times to schedule sleep study.  Patient has been unavailable at the phone numbers we have on file and has not returned our calls.  At this point we will send a letter asking patient to please contact the sleep lab to schedule their sleep study.  If patient calls back we will schedule them for their sleep study. 

## 2018-06-01 DIAGNOSIS — F3181 Bipolar II disorder: Secondary | ICD-10-CM | POA: Diagnosis not present

## 2018-06-02 ENCOUNTER — Other Ambulatory Visit: Payer: Self-pay | Admitting: Physician Assistant

## 2018-06-14 DIAGNOSIS — I10 Essential (primary) hypertension: Secondary | ICD-10-CM | POA: Diagnosis not present

## 2018-06-14 DIAGNOSIS — R7303 Prediabetes: Secondary | ICD-10-CM | POA: Diagnosis not present

## 2018-06-14 DIAGNOSIS — F329 Major depressive disorder, single episode, unspecified: Secondary | ICD-10-CM | POA: Diagnosis not present

## 2018-06-14 DIAGNOSIS — G43909 Migraine, unspecified, not intractable, without status migrainosus: Secondary | ICD-10-CM | POA: Diagnosis not present

## 2018-06-15 ENCOUNTER — Other Ambulatory Visit: Payer: Self-pay | Admitting: Physician Assistant

## 2018-06-16 ENCOUNTER — Other Ambulatory Visit (HOSPITAL_COMMUNITY): Payer: Self-pay | Admitting: Surgery

## 2018-06-16 MED ORDER — LISINOPRIL-HYDROCHLOROTHIAZIDE 20-25 MG PO TABS: 1.0000 | ORAL_TABLET | Freq: Every day | ORAL | 1 refills | Status: DC

## 2018-06-16 NOTE — Addendum Note (Signed)
Addended by: Marian Sorrow on: 06/16/2018 10:31 AM   Modules accepted: Orders

## 2018-06-19 ENCOUNTER — Other Ambulatory Visit: Payer: Self-pay | Admitting: Physician Assistant

## 2018-06-29 DIAGNOSIS — F3181 Bipolar II disorder: Secondary | ICD-10-CM | POA: Diagnosis not present

## 2018-06-30 ENCOUNTER — Other Ambulatory Visit (HOSPITAL_COMMUNITY): Payer: Self-pay | Admitting: Surgery

## 2018-06-30 ENCOUNTER — Ambulatory Visit (HOSPITAL_COMMUNITY)
Admission: RE | Admit: 2018-06-30 | Discharge: 2018-06-30 | Disposition: A | Discharge status: Home / Self Care | Payer: PPO | Source: Ambulatory Visit | Attending: Surgery | Admitting: Surgery

## 2018-06-30 ENCOUNTER — Other Ambulatory Visit: Payer: Self-pay

## 2018-06-30 DIAGNOSIS — I4510 Unspecified right bundle-branch block: Secondary | ICD-10-CM | POA: Diagnosis not present

## 2018-06-30 DIAGNOSIS — R000 Tachycardia, unspecified: Secondary | ICD-10-CM | POA: Diagnosis not present

## 2018-06-30 DIAGNOSIS — R9431 Abnormal electrocardiogram [ECG] [EKG]: Secondary | ICD-10-CM | POA: Insufficient documentation

## 2018-06-30 DIAGNOSIS — R933 Abnormal findings on diagnostic imaging of other parts of digestive tract: Secondary | ICD-10-CM | POA: Insufficient documentation

## 2018-06-30 DIAGNOSIS — Z01818 Encounter for other preprocedural examination: Secondary | ICD-10-CM | POA: Diagnosis not present

## 2018-06-30 DIAGNOSIS — I10 Essential (primary) hypertension: Secondary | ICD-10-CM | POA: Diagnosis not present

## 2018-07-06 ENCOUNTER — Encounter: Payer: Self-pay | Admitting: Registered"

## 2018-07-06 ENCOUNTER — Encounter: Payer: PPO | Attending: Surgery | Admitting: Registered"

## 2018-07-06 DIAGNOSIS — Z713 Dietary counseling and surveillance: Secondary | ICD-10-CM | POA: Diagnosis not present

## 2018-07-06 DIAGNOSIS — E669 Obesity, unspecified: Secondary | ICD-10-CM

## 2018-07-06 NOTE — Progress Notes (Signed)
Pre-Op Assessment Visit:  Pre-Operative Sleeve Gastrectomy Surgery  Medical Nutrition Therapy:  Appt start time: 9:50  End time:  11:10  Patient was seen on 07/06/2018 for Pre-Operative Nutrition Assessment. Assessment and letter of approval faxed to [facility] Bariatric Surgery Program coordinator on 07/06/2018.   Pt expectation of surgery: be healthier, wants to prolong life, prevent heart attack  Pt expectation of Dietitian: help stay on track  Start weight at [facility]: 313.2 BMI: 61.17   Pt states she has tried a lot of diets in the past and they did not work. Pt states she wants to begin dance class and roller skating again. Pt states she has been watching her portions lately and being more mindful. Pt states her mom passed away last 09/19/2023. Pt reports history of psychosis for 6 months, causing her to relocate from [place] to [place] 6 years ago. Pt reports mild acid reflux.   Pt states she has eliminated soda intake.   Per insurance, pt needs 6 SWL visits prior to surgery. Pt will need Protein Shakes and Vitamin and Mineral Recommendations at next visit.    24 hr Dietary Recall: First Meal: typically skips  Snack: none Second Meal: typically skips Snack: none Third Meal: chicken/beef/fish + baked potato + salad or rice + green vegetables  Snack: none Beverages: water with flavor packs, carbonated water; 64+ ounces  Encouraged to engage in 75 minutes of moderate physical activity including cardiovascular and weight baring weekly  Handouts given during visit include:  . Pre-Op Goals  During the appointment today the following Pre-Op Goals were reviewed with the patient: . Track your food and beverage: MyFitness Pal or Baritastic App . Make healthy food choices . Begin to limit portion sizes . Limited concentrated sugars and fried foods . Keep fat/sugar in the single digits per serving on          food labels . Practice CHEWING your food  (aim for 30 chews per bite or  until applesauce consistency) . Practice not drinking 15 minutes before, during, and 30 minutes after each meal/snack . Avoid all carbonated beverages  . Avoid/limit caffeinated beverages  . Avoid all sugar-sweetened beverages . Avoid alcohol . Consume 3 meals per day; eat every 3-5 hours . Make a list of non-food related activities . Aim for 64-100 ounces of FLUID daily  . Aim for at least 60-80 grams of PROTEIN daily . Look for a liquid protein source that contain ?15 g protein and ?5 g carbohydrate  (ex: shakes, drinks, shots) . Physical activity is an important part of a healthy lifestyle so keep it moving!  Follow diet recommendations listed below Energy and Macronutrient Recommendations: Calories: 1600 Carbohydrate: 180 Protein: 120 Fat: 44  Demonstrated degree of understanding via:  Teach Back   Teaching Method Utilized:  Visual Auditory Hands on  Barriers to learning/adherence to lifestyle change: contemplative stage of change  Patient to call the [facility] to enroll in Pre-Op and Post-Op Nutrition Education when surgery date is scheduled.

## 2018-07-10 ENCOUNTER — Encounter: Payer: Self-pay | Admitting: Gastroenterology

## 2018-07-12 ENCOUNTER — Ambulatory Visit (INDEPENDENT_AMBULATORY_CARE_PROVIDER_SITE_OTHER): Payer: PPO | Admitting: Physician Assistant

## 2018-07-12 ENCOUNTER — Encounter: Payer: Self-pay | Admitting: Physician Assistant

## 2018-07-12 VITALS — BP 136/88 | HR 98 | Temp 98.3°F | Ht 60.0 in | Wt 317.0 lb

## 2018-07-12 DIAGNOSIS — I10 Essential (primary) hypertension: Secondary | ICD-10-CM

## 2018-07-12 NOTE — Progress Notes (Signed)
[name] is a 52 y.o. female is here to discuss: Hypertension  I acted as a Education administrator for [employer], PA-C Anselmo Pickler, LPN  History of Present Illness:   Chief Complaint  Patient presents with  . Hypertension    Hypertension  This is a chronic problem. Episode onset: Pt here for follow up on blood pressure. Pt checking Bp every other day. Systolic ranging 740-814, diastolic 79 to low 48'J. The problem has been gradually improving since onset. Associated symptoms include headaches (1-2 x's per week) and malaise/fatigue (better). Pertinent negatives include no blurred vision, chest pain, palpitations, peripheral edema or shortness of breath. Risk factors for coronary artery disease include obesity (Insulin Resistance). There are no compliance problems (Pt has been trying to use the Treadmill twice a week.Marland Kitchen).  Identifiable causes of hypertension include sleep apnea.   BP Readings from Last 3 Encounters:  07/12/18 136/88  05/12/18 (!) 130/96  04/20/18 (!) 160/88     There are no preventive care reminders to display for this patient.  Past Medical History:  Diagnosis Date  . Asthma   . Bipolar 1 disorder ([place])   . History of chicken pox   . Hypertension   . Sleep apnea   . Vitamin D deficiency      Social History   Socioeconomic History  . Marital status: Single    Spouse name: Not on file  . Number of children: Not on file  . Years of education: Not on file  . Highest education level: Not on file  Occupational History  . Not on file  Social Needs  . Financial resource strain: Not on file  . Food insecurity:    Worry: Not on file    Inability: Not on file  . Transportation needs:    Medical: Not on file    Non-medical: Not on file  Tobacco Use  . Smoking status: Never Smoker  . Smokeless tobacco: Never Used  Substance and Sexual Activity  . Alcohol use: Yes    Alcohol/week: 1.0 - 2.0 standard drinks    Types: 1 - 2 Glasses of wine per week  . Drug  use: No  . Sexual activity: Never  Lifestyle  . Physical activity:    Days per week: Not on file    Minutes per session: Not on file  . Stress: Not on file  Relationships  . Social connections:    Talks on phone: Not on file    Gets together: Not on file    Attends religious service: Not on file    Active member of club or organization: Not on file    Attends meetings of clubs or organizations: Not on file    Relationship status: Not on file  . Intimate partner violence:    Fear of current or ex partner: Not on file    Emotionally abused: Not on file    Physically abused: Not on file    Forced sexual activity: Not on file  Other Topics Concern  . Not on file  Social History Narrative   Lives in [place], was living in [place], [place] for 20 years   No children, not married   Single   Family is nearby, mom and sister nearby   Currently on disability: 4 years ago she had an episode of psychosis (states that she didn't sleep for ~6-8 days prior to) and was in and out of mental institution for 6 months, lost her job and had to move  back to [place] to be near family    Past Surgical History:  Procedure Laterality Date  . BREAST REDUCTION SURGERY  1986  . Heart defect surgery    . TONSILLECTOMY      Family History  Problem Relation Age of Onset  . Diabetes Mother   . Hypertension Mother   . Stroke Father   . Diabetes Maternal Grandmother   . CVA Maternal Grandmother   . Diabetes Maternal Grandfather   . Colon cancer Paternal Grandmother   . Diabetes Paternal Grandfather   . Breast cancer Maternal Aunt   . Asthma Other   . Cancer Other   . COPD Other     PMHx, SurgHx, SocialHx, FamHx, Medications, and Allergies were reviewed in the Visit Navigator and updated as appropriate.   Patient Active Problem List   Diagnosis Date Noted  . Knee pain, chronic 04/10/2018  . Insulin resistance 04/10/2018  . Vitamin D deficiency   . Sleep apnea 03/31/2017  . Morbid obesity ([place])  03/31/2017  . Hypertension 03/31/2017  . Bipolar 1 disorder ([place]) 03/31/2017    Social History   Tobacco Use  . Smoking status: Never Smoker  . Smokeless tobacco: Never Used  Substance Use Topics  . Alcohol use: Yes    Alcohol/week: 1.0 - 2.0 standard drinks    Types: 1 - 2 Glasses of wine per week  . Drug use: No    Current Medications and Allergies:    Current Outpatient Medications:  .  ARIPiprazole (ABILIFY) 5 MG tablet, Take 5 mg by mouth at bedtime, Disp: , Rfl: 1 .  doxepin (SINEQUAN) 25 MG capsule, Take 50 mg by mouth at bedtime, Disp: , Rfl: 1 .  lisinopril-hydrochlorothiazide (PRINZIDE,ZESTORETIC) 20-25 MG tablet, Take 1 tablet by mouth daily., Disp: 90 tablet, Rfl: 1 .  metFORMIN (GLUCOPHAGE) 500 MG tablet, TAKE 1 TABLET BY MOUTH EVERY DAY WITH BREAKFAST, Disp: 30 tablet, Rfl: 0  No Known Allergies  Review of Systems   Review of Systems  Constitutional: Positive for malaise/fatigue (better).  Eyes: Negative for blurred vision.  Respiratory: Negative for shortness of breath.   Cardiovascular: Negative for chest pain and palpitations.  Neurological: Positive for headaches (1-2 x's per week).    Vitals:   Vitals:   07/12/18 1021  BP: 136/88  Pulse: 98  Temp: 98.3 F (36.8 C)  TempSrc: Oral  SpO2: 100%  Weight: (!) 317 lb (143.8 kg)  Height: 5' (1.524 m)     Body mass index is 61.91 kg/m.   Physical Exam:    Physical Exam  Constitutional: She appears well-developed. She is cooperative.  Non-toxic appearance. She does not have a sickly appearance. She does not appear ill. No distress.  Cardiovascular: Normal rate, regular rhythm, S1 normal, S2 normal, normal heart sounds and normal pulses.  No LE edema  Pulmonary/Chest: Effort normal and breath sounds normal.  Neurological: She is alert. GCS eye subscore is 4. GCS verbal subscore is 5. GCS motor subscore is 6.  Skin: Skin is warm, dry and intact.  Psychiatric: She has a normal mood and affect. Her  speech is normal and behavior is normal.  Nursing note and vitals reviewed.    Assessment and Plan:    [name] was seen today for hypertension.  Diagnoses and all orders for this visit:  Essential hypertension   Improving. Continue Lisinopril-HCTZ 20-25 mg. Follow-up in 3 months, sooner if concerns.   . Reviewed expectations re: course of current medical issues. . Discussed self-management  of symptoms. . Outlined signs and symptoms indicating need for more acute intervention. . Patient verbalized understanding and all questions were answered. . See orders for this visit as documented in the electronic medical record. . Patient received an After Visit Summary.  CMA or LPN served as scribe during this visit. History, Physical, and Plan performed by medical provider. The above documentation has been reviewed and is accurate and complete.  [name], PA-C [place] 07/12/2018  Follow-up: No follow-ups on file.

## 2018-07-12 NOTE — Patient Instructions (Signed)
Continue current regimen for your blood pressure.  Let's follow-up in 3 months, sooner if concerns.  Take care, Aldona Bar

## 2018-07-17 DIAGNOSIS — F3181 Bipolar II disorder: Secondary | ICD-10-CM | POA: Diagnosis not present

## 2018-07-25 ENCOUNTER — Encounter: Payer: Self-pay | Admitting: Gastroenterology

## 2018-07-25 ENCOUNTER — Ambulatory Visit: Payer: PPO | Admitting: Gastroenterology

## 2018-07-25 VITALS — BP 142/76 | HR 89 | Ht 60.0 in | Wt 311.0 lb

## 2018-07-25 DIAGNOSIS — R932 Abnormal findings on diagnostic imaging of liver and biliary tract: Secondary | ICD-10-CM | POA: Diagnosis not present

## 2018-07-25 DIAGNOSIS — R935 Abnormal findings on diagnostic imaging of other abdominal regions, including retroperitoneum: Secondary | ICD-10-CM | POA: Diagnosis not present

## 2018-07-25 DIAGNOSIS — K219 Gastro-esophageal reflux disease without esophagitis: Secondary | ICD-10-CM

## 2018-07-25 MED ORDER — PANTOPRAZOLE SODIUM 40 MG PO TBEC: 40.0000 mg | DELAYED_RELEASE_TABLET | ORAL | 3 refills | Status: DC

## 2018-07-25 MED ORDER — OMEPRAZOLE 40 MG PO CPDR: 40.0000 mg | DELAYED_RELEASE_CAPSULE | Freq: Every day | ORAL | 3 refills | Status: DC

## 2018-07-25 NOTE — Progress Notes (Addendum)
Referring Provider: [name], PA Primary Care Physician:  [name], PA   Reason for Consultation:  Nausea and abdominal pain   IMPRESSION:  Abdominal pain Abnormal upper GI series    - mucosal edema along the greater curvature of the stomach Echogenic liver on ultrasound Cholelithiasis BMI 60 - under evaluation for bariatric surgery No prior colon cancer screening Lactose intolerance  UGI suggests peptic ulcer disease as the cause of her abdominal pain. Differential also includes: reflux, esophagitis, H pylori, biliary colic, functional abdominal pain.    PLAN: EGD with gastric biopsies (to be performed at the hospital) Protonix 40 mg QAM (may substitute alternate PPI if insurance provides better coverage) GERD lifestyle modifications Screening colonoscopy  I consented the patient at the bedside today discussing the risks, benefits, and alternatives to endoscopic evaluation. In particular, we discussed the risks that include, but are not limited to, reaction to medication, cardiopulmonary compromise, bleeding requiring blood transfusion, aspiration resulting in pneumonia, perforation requiring surgery, and even death. We reviewed the risk of missed lesion including polyps or even cancer. The patient acknowledges these risks and asks that we proceed.   HPI: [name] is a 52 y.o. female seen in consultation for abdominal pain. The history is obtained from the patient, records from [facility] surgery, and review of her electronic health record. Her aunt accompanies her to this appointment.   Seen in the ED in March 2019 for epigastric abdominal pain. 8/10. Cramping, aching. Radiates to the chest. Associated constipation, diarrhea, flatus, and nausea. Pain worsened by belching and flatus. She reports some brash.  Antacids and acetaminophen provided no significant relief. GI cocktail and Zofran provided some symptoms relief. She was discharged on PPI and  Zofran.  Symptoms improved and were not daily. But, have recently returned and are escalating in frequency and intensity. She has been off the PPI for several months. No other associated symptoms. No identified exacerbating or relieving features.   Abdominal ultrasound for abdominal pain 12/06/2017 while she was in the ED: Showed multiple gallstones measuring up to 1.1 cm, no sonographic Murphy, and normal bile ducts.  The liver is echogenic.   UGIS 06/30/18 obtained preoperatively as part of her bariatric surgery evaluation: Normal esophagus.  Prominent mucosal pattern along the distal greater curvature of the stomach multiple small parallel linear invaginations suggesting gastritis or other mucosal lesion.  Normal duodenum.   She reports a history of lactose intolerance and has been following a lactulose-free diet.   No prior endoscopic evaluation.  Aunt had similar symptoms that resolved after cholecystectomy.   Past Medical History:  Diagnosis Date  . Asthma   . Bipolar 1 disorder ([place])   . History of chicken pox   . Hypertension   . Sleep apnea   . Vitamin D deficiency     Past Surgical History:  Procedure Laterality Date  . BREAST REDUCTION SURGERY  1986  . Heart defect surgery    . TONSILLECTOMY      Prior to Admission medications   Medication Sig Start Date End Date Taking? Authorizing Provider  ARIPiprazole (ABILIFY) 5 MG tablet Take 5 mg by mouth at bedtime 03/21/17   [provider]  doxepin (SINEQUAN) 25 MG capsule Take 50 mg by mouth at bedtime 03/21/17   [provider]  lisinopril-hydrochlorothiazide (PRINZIDE,ZESTORETIC) 20-25 MG tablet Take 1 tablet by mouth daily. 06/16/18   [name], PA  metFORMIN (GLUCOPHAGE) 500 MG tablet TAKE 1 TABLET BY MOUTH EVERY DAY WITH BREAKFAST  06/19/18   [name], PA    Current Outpatient Medications  Medication Sig Dispense Refill  . ARIPiprazole (ABILIFY) 5 MG tablet Take 5 mg by mouth at bedtime  1  .  doxepin (SINEQUAN) 25 MG capsule Take 50 mg by mouth at bedtime  1  . lisinopril-hydrochlorothiazide (PRINZIDE,ZESTORETIC) 20-25 MG tablet Take 1 tablet by mouth daily. 90 tablet 1  . metFORMIN (GLUCOPHAGE) 500 MG tablet TAKE 1 TABLET BY MOUTH EVERY DAY WITH BREAKFAST 30 tablet 0   No current facility-administered medications for this visit.     Allergies as of 07/25/2018  . (No Known Allergies)    Family History  Problem Relation Age of Onset  . Diabetes Mother   . Hypertension Mother   . Stroke Father   . Diabetes Maternal Grandmother   . CVA Maternal Grandmother   . Diabetes Maternal Grandfather   . Colon cancer Paternal Grandmother   . Diabetes Paternal Grandfather   . Breast cancer Maternal Aunt   . Asthma Other   . Cancer Other   . COPD Other     Social History   Socioeconomic History  . Marital status: Single    Spouse name: Not on file  . Number of children: Not on file  . Years of education: Not on file  . Highest education level: Not on file  Occupational History  . Not on file  Social Needs  . Financial resource strain: Not on file  . Food insecurity:    Worry: Not on file    Inability: Not on file  . Transportation needs:    Medical: Not on file    Non-medical: Not on file  Tobacco Use  . Smoking status: Never Smoker  . Smokeless tobacco: Never Used  Substance and Sexual Activity  . Alcohol use: Yes    Alcohol/week: 1.0 - 2.0 standard drinks    Types: 1 - 2 Glasses of wine per week  . Drug use: No  . Sexual activity: Never  Lifestyle  . Physical activity:    Days per week: Not on file    Minutes per session: Not on file  . Stress: Not on file  Relationships  . Social connections:    Talks on phone: Not on file    Gets together: Not on file    Attends religious service: Not on file    Active member of club or organization: Not on file    Attends meetings of clubs or organizations: Not on file    Relationship status: Not on file  . Intimate  partner violence:    Fear of current or ex partner: Not on file    Emotionally abused: Not on file    Physically abused: Not on file    Forced sexual activity: Not on file  Other Topics Concern  . Not on file  Social History Narrative   Lives in [place], was living in [place], [place] for 20 years   No children, not married   Single   Family is nearby, mom and sister nearby   Currently on disability: 4 years ago she had an episode of psychosis (states that she didn't sleep for ~6-8 days prior to) and was in and out of mental institution for 6 months, lost her job and had to move back to [place] to be near family    Review of Systems: 12 system ROS is negative except as noted above.   Physical Exam: General:   Alert, well-nourished, pleasant and cooperative  in NAD Head:  Normocephalic and atraumatic. Eyes:  Sclera clear, no icterus.   Conjunctiva pink. Mouth:  No deformity or lesions.   Neck:  Supple; no thyromegaly. Lungs:  Clear throughout to auscultation.   No wheezes.  Heart:  Regular rate and rhythm; no murmurs Abdomen:  Soft, mild epigastric pain with palpation, normal bowel sounds. No rebound or guarding. No hepatosplenomegaly.  Rectal:  Deferred  Msk:  Symmetrical without gross deformities. Extremities:  No gross deformities or edema. Neurologic:  Alert and  oriented x4;  grossly nonfocal Skin:  No rash or bruise. Psych:  Alert and cooperative. Normal mood and affect.    Cinderella Christoffersen L. Tarri Glenn Md, MPH [facility] 07/25/2018, 2:56 PM

## 2018-07-25 NOTE — Patient Instructions (Addendum)
Tips for colonoscopy:  -STAY WELL HYDRATED FOR 3-4 DAYS PRIOR TO THE EXAM. This reduces nausea and dehydration.  -TO PREVENT SKIN/HEMORRHOID IRRITATION- prior to wiping, put A&Dointment or vaseline on the toilet paper. -Keep a towel or pad on the bed.  -DRINK 64oz of clear liquids in the morning of prep day (PRIOR TO STARTING THE PREP) to be sure that there is enough fluid to flush the colon and stay hydrated!!!! This is in addition to the fluids required for preparation.  You have been scheduled for an endoscopy and colonoscopy. Please follow the written instructions given to you at your visit today. Please pick up your prep supplies at the pharmacy within the next 1-3 days. If you use inhalers (even only as needed), please bring them with you on the day of your procedure. Your physician has requested that you go to www.startemmi.com and enter the access code given to you at your visit today. This web site gives a general overview about your procedure. However, you should still follow specific instructions given to you by our office regarding your preparation for the procedure.  We have sent the following medications to your pharmacy for you to pick up at your convenience: Omeprazole 40 mg every morning

## 2018-07-26 ENCOUNTER — Other Ambulatory Visit: Payer: Self-pay | Admitting: Physician Assistant

## 2018-08-01 DIAGNOSIS — F3181 Bipolar II disorder: Secondary | ICD-10-CM | POA: Diagnosis not present

## 2018-08-01 DIAGNOSIS — F3132 Bipolar disorder, current episode depressed, moderate: Secondary | ICD-10-CM | POA: Diagnosis not present

## 2018-08-08 ENCOUNTER — Ambulatory Visit (INDEPENDENT_AMBULATORY_CARE_PROVIDER_SITE_OTHER): Payer: PPO | Admitting: Sports Medicine

## 2018-08-08 ENCOUNTER — Encounter: Payer: Self-pay | Admitting: Sports Medicine

## 2018-08-08 VITALS — BP 110/78 | HR 102 | Ht 60.0 in | Wt 312.6 lb

## 2018-08-08 DIAGNOSIS — M9902 Segmental and somatic dysfunction of thoracic region: Secondary | ICD-10-CM

## 2018-08-08 DIAGNOSIS — M546 Pain in thoracic spine: Secondary | ICD-10-CM

## 2018-08-08 DIAGNOSIS — M25561 Pain in right knee: Secondary | ICD-10-CM | POA: Diagnosis not present

## 2018-08-08 DIAGNOSIS — G8929 Other chronic pain: Secondary | ICD-10-CM | POA: Diagnosis not present

## 2018-08-08 DIAGNOSIS — M25562 Pain in left knee: Secondary | ICD-10-CM | POA: Diagnosis not present

## 2018-08-08 DIAGNOSIS — F3181 Bipolar II disorder: Secondary | ICD-10-CM | POA: Diagnosis not present

## 2018-08-08 DIAGNOSIS — F3132 Bipolar disorder, current episode depressed, moderate: Secondary | ICD-10-CM | POA: Diagnosis not present

## 2018-08-08 MED ORDER — DICLOFENAC SODIUM 2 % TD SOLN: 1.0000 "application " | Freq: Two times a day (BID) | TRANSDERMAL | 2 refills | Status: DC

## 2018-08-08 MED ORDER — DICLOFENAC SODIUM 2 % TD SOLN: 1.0000 "application " | Freq: Two times a day (BID) | TRANSDERMAL | 0 refills | Status: AC

## 2018-08-08 NOTE — Progress Notes (Signed)
[name]. [name], [place] at [place] [facility] - 52 y.o. female MRN [id number]  Date of birth: 1966-09-03  Visit Date: 08/08/2018  PCP: [name], PA   Referred by: [name], [place]   Scribe(s) for today's visit: Josepha Pigg, CMA  SUBJECTIVE:  [name] is here for Initial Assessment (back and knee pain)   HPI: 08/08/2018: Her thoracic back pain symptoms INITIALLY: Began about 3 days ago and MOI is unknown. She recalls trying to pick up her dog but the dog is only 4 lbs.  Described as severe (9/10) sharp stabbing pain, nonradiating Worsened with movement, breathing. Improved with rest.  Additional associated symptoms include: No tenderness to palpation. She reports night time disturbance.     At this time symptoms show no change compared to onset. She has been taking IBU with no relief. She tried stretching which made sx worse. Warm water from the shower providers short term relief.  No recent XR of T-spine  R knee pain She hasn't noticed any swelling around the knee. Pain causes her to miss a step when it hits. Pain is deep to the patella. It has been tender to touch in the past but is not today. So c/o pins and needles in both thighs. She has hx of L knee pain, fell on ice. She cannot recall any recent or past injury to the knee. She has tried massage and heat with some relief. Pain is worse with weight bearing. She has been ambulating with a cane but does not have it with her today.   No recent XR of the R knee   REVIEW OF SYSTEMS: Reports night time disturbances. Denies fevers, chills, or night sweats. Denies unexplained weight loss. Denies personal history of cancer. Denies changes in bowel or bladder habits. Denies recent unreported falls. Denies new or worsening dyspnea or wheezing (Asthma). Denies headaches or dizziness (Hx of chronic HA).  Reports numbness, tingling or weakness in  the extremities.  Denies dizziness or presyncopal episodes Denies lower extremity edema    HISTORY:  Prior history reviewed and updated per electronic medical record.  Social History   Occupational History  . Not on file  Tobacco Use  . Smoking status: Never Smoker  . Smokeless tobacco: Never Used  Substance and Sexual Activity  . Alcohol use: Yes    Alcohol/week: 1.0 - 2.0 standard drinks    Types: 1 - 2 Glasses of wine per week  . Drug use: No  . Sexual activity: Never   Social History   Social History Narrative   Lives in [place], was living in [place], [place] for 20 years   No children, not married   Single   Family is nearby, mom and sister nearby   Currently on disability: 4 years ago she had an episode of psychosis (states that she didn't sleep for ~6-8 days prior to) and was in and out of mental institution for 6 months, lost her job and had to move back to [place] to be near family    Past Medical History:  Diagnosis Date  . Asthma   . Bipolar 1 disorder ([place])   . History of chicken pox   . Hypertension   . Sleep apnea   . Vitamin D deficiency    Past Surgical History:  Procedure Laterality Date  . BREAST REDUCTION SURGERY  1986  . Heart defect surgery    . TONSILLECTOMY  family history includes Asthma in her other; Breast cancer in her maternal aunt; COPD in her other; CVA in her maternal grandmother; Cancer in her other; Colon cancer in her paternal grandmother; Diabetes in her maternal grandfather, maternal grandmother, mother, and paternal grandfather; Hypertension in her mother; Stroke in her father.  DATA OBTAINED & REVIEWED:   Recent Labs    02/08/18 0942  HGBA1C 5.9   Problem  Knee Pain, Chronic   .   OBJECTIVE:  VS:  HT:5' (152.4 cm)   WT:(!) 312 lb 9.6 oz (141.8 kg)  BMI:61.05    BP:110/78  HR:(!) 102bpm  TEMP: ( )  RESP:98 %   PHYSICAL EXAM: CONSTITUTIONAL: Well-developed, Well-nourished and In no acute distress PSYCHIATRIC:  Alert & appropriately interactive. and Not depressed or anxious appearing. RESPIRATORY: No increased work of breathing and Trachea Midline EYES: Pupils are equal., EOM intact without nystagmus. and No scleral icterus.  VASCULAR EXAM: Warm and well perfused NEURO: unremarkable  MSK Exam: Bilateral knee  Well aligned, no significant deformity. No overlying skin changes. Large soft tissue envelope.  No overlying skin change.   RANGE OF MOTION & STRENGTH  Normal flexion-extension bilaterally.  Good internal and external rotation bilateral hips.   SPECIALITY TESTING:  No significant pain with varus and valgus testing with 2 to 3 mm of opening bilaterally.  No effusion bilaterally.  Negative McMurray's bilaterally    ASSESSMENT   1. Acute pain of right knee   2. Chronic pain of both knees   3. Somatic dysfunction of thoracic region   4. Acute bilateral thoracic back pain     PLAN:  Pertinent additional documentation may be included in corresponding procedure notes, imaging studies, problem based documentation and patient instructions.  Procedures:  . Osteopathic manipulation was performed today based on physical exam findings.  Please see procedure note for further information including Osteopathic Exam findings . Discussed the foundation of treatment for this condition is physical therapy and/or daily (5-6 days/week) therapeutic exercises, focusing on core strengthening, coordination, neuromuscular control/reeducation.  Therapeutic exercises prescribed per procedure note.  Medications:  Meds ordered this encounter  Medications  . Diclofenac Sodium (PENNSAID) 2 % SOLN    Sig: Place 1 application onto the skin 2 (two) times daily.    Dispense:  112 g    Refill:  2    Home Phone      [phone] Mobile          [phone]   . Diclofenac Sodium (PENNSAID) 2 % SOLN    Sig: Place 1 application onto the skin 2 (two) times daily for 1 day.    Dispense:  8 g    Refill:  0    Discussion/Instructions: Knee pain, chronic Pennsaid prescription and sample provided today. Therapeutic exercises per note. Any lack of improvement consider intra-articular injections.  Likely underlying OA.  . Thoracic back pain is reflective of underlying osteopathic findings.  Minimal thoracic rotation.  Likely reflective of increased physical activity. . Discussed red flag symptoms that warrant earlier emergent evaluation and patient voices understanding. . Activity modifications and the importance of avoiding exacerbating activities (limiting pain to no more than a 4 / 10 during or following activity) recommended and discussed.  Follow-up:  . Return in about 4 weeks (around 09/05/2018).  . If any lack of improvement consider: further diagnostic evaluation with MRI and/or x-ray of the knees. and referral to Physical Therapy . At follow up will plan to consider: repeat osteopathic manipulation or Injection therapy  for the knees.     CMA/ATC served as Education administrator during this visit. History, Physical, and Plan performed by medical provider. Documentation and orders reviewed and attested to.      Gerda Diss, [place] [facility] Physician

## 2018-08-08 NOTE — Assessment & Plan Note (Signed)
Pennsaid prescription and sample provided today. Therapeutic exercises per note. Any lack of improvement consider intra-articular injections.  Likely underlying OA.

## 2018-08-08 NOTE — Patient Instructions (Signed)
Pennsaid instructions: You have been given a sample/prescription for Pennsaid, a topical medication.     You are to apply this gel to your injured body part twice daily (morning and evening).   A little goes a long way so you can use about a pea-sized amount for each area.   Spread this small amount over the area into a thin film and let it dry.   Be sure that you do not rub the gel into your skin for more than 10 or 15 seconds otherwise it can irritate you skin.    Once you apply the gel, please do not put any other lotion or clothing in contact with that area for 30 minutes to allow the gel to absorb into your skin.   Some people are sensitive to the medication and can develop a sunburn-like rash.  If you have only mild symptoms it is okay to continue to use the medication but if you have any breakdown of your skin you should discontinue its use and please let us know.   If you have been written a prescription for Pennsaid, you will receive a pump bottle of this topical gel through a mail order pharmacy.  The instructions on the bottle will say to apply two pumps twice a day which may be too much gel for your particular area so use the pea-sized amount as your guide.   Instructions for Duexis, Pennsaid and Vimovo:  Your prescription will be filled through a participating [facility] mail order pharmacy.  You will receive a phone call or text from one of the participating pharmacies which can be located in any state in the [place].  You must communicate directly with them to have this medication filled.  When the pharmacy contacts you, they will need your mailing address (for shipment of the medication) andy they will need payment information if you have a copay (typically no more than $10). If you have not heard from them 2-3 days after your appointment with Dr. Paulla Fore, contact [facility] directly at [phone].   Please perform the exercise program that we have prepared for  you and gone over in detail on a daily basis.  In addition to the handout you were provided you can access your program through: [email or web address]   Your unique program code is: [email or web address]

## 2018-08-08 NOTE — Progress Notes (Signed)
PROCEDURE NOTE: THERAPEUTIC EXERCISES (97110) 15 minutes spent for Therapeutic exercises as below and as referenced in the AVS.  This included exercises focusing on stretching, strengthening, with significant focus on eccentric aspects.   Proper technique shown and discussed handout in great detail with ATC.  All questions were discussed and answered.   Long term goals include an improvement in range of motion, strength, endurance as well as avoiding reinjury. Frequency of visits is one time as determined during today's  office visit. Frequency of exercises to be performed is as per handout.  EXERCISES REVIEWED:  Thoracic rotation  Straight leg raises

## 2018-08-08 NOTE — Progress Notes (Signed)
PROCEDURE NOTE : OSTEOPATHIC MANIPULATION The decision today to treat with Osteopathic Manipulative Therapy (OMT) was based on physical exam findings. Verbal consent was obtained following a discussion with the patient regarding the of risks, benefits and potential side effects, including an acute pain flare,post manipulation soreness and need for repeat treatments.     Contraindications to OMT: NONE  Manipulation was performed as below: Regions Treated OMT Techniques Used  Thoracic spine HVLA muscle energy myofascial release   The patient tolerated the treatment well and reported Improved symptoms following treatment today. Patient was given medications, exercises, stretches and lifestyle modifications per AVS and verbally.   OSTEOPATHIC/STRUCTURAL EXAM:   T6 FRS right (Flexed, Rotated & Sidebent)

## 2018-08-09 ENCOUNTER — Telehealth: Payer: Self-pay | Admitting: Physician Assistant

## 2018-08-09 ENCOUNTER — Telehealth: Payer: Self-pay

## 2018-08-09 NOTE — Telephone Encounter (Signed)
Please see message. °

## 2018-08-09 NOTE — Telephone Encounter (Signed)
[place]   PA for Pennsaid  Initiated via [email or web address] [place] [id number]

## 2018-08-09 NOTE — Telephone Encounter (Signed)
See note  Copied from [place] [phone]. Topic: General - Other >> Aug 08, 2018  3:54 PM Leward Quan A wrote: Reason for CRM: Ebony Hail from [facility] called to say that they are unable to fill Diclofenac Sodium (PENNSAID) 2 % SOLN, because the patient has Medicare and it is very ex pensive So can prescribe something else or do a Prior Auth. Ph# [phone]

## 2018-08-10 ENCOUNTER — Encounter: Payer: PPO | Attending: Surgery | Admitting: Dietician

## 2018-08-10 DIAGNOSIS — Z713 Dietary counseling and surveillance: Secondary | ICD-10-CM | POA: Diagnosis not present

## 2018-08-10 DIAGNOSIS — E669 Obesity, unspecified: Secondary | ICD-10-CM

## 2018-08-10 NOTE — Patient Instructions (Signed)
New Pre-Op Goals to Work On . Make healthy food choices  . Aim for 64-100 ounces of fluid daily  . Practice not drinking 15 minutes before, during, and 30 minutes after each meal/snack

## 2018-08-10 NOTE — Progress Notes (Signed)
Bariatric Supervised Weight Loss Visit Appt Start Time: 10:30am  End Time: 11:00am  Planned Surgery: Sleeve Gastrectomy   1st out of 6 SWL Appointments   NUTRITION ASSESSMENT  Anthropometrics  Start weight at [facility]: 313.2 lbs Today's weight: 310.2 lbs Weight change: -3 lbs (since 07-06-2018) BMI: 60.6 kg/m2    Psychosocial/Lifestyle Pt states she lives alone other than with her new 20 month old puppy.    24-Hr Dietary Recall First Meal: protein shake  Snack: none Second Meal: [place] sandwich or PBJ Snack: none  Third Meal: meat + salad  Snack: none Beverages: water, coffee, tea   Food & Nutrition Related Hx Dietary Hx: Pt states she is eating 3 meals per day. Pt states sometimes her puppy wakes her up and she has trouble going back to sleep. Pt states she has difficulty sleeping in general and will sometimes eat when she is awake at night. Pt states she craves sugar and sweets, such as the Starbucks caramel frappe. Pt states she is learning how to balance "forbidden foods" such as soda, working towards understanding how much is too much and when it is okay to indulge.  Estimated Daily Fluid Intake: 50-64 oz GI / Other Notable Symptoms: acid reflux   Physical Activity  Current average weekly physical activity: none. Pt states she has an elliptical at home to use and plans to start walking her new puppy.    Estimated Energy Needs Calories: 1600 Carbohydrate: 180g Protein: 120g Fat: 44g  Progress Towards Pre-Op Goals Previously Chosen . Pt states she is very proud of herself for giving up soda.  . Pt records her food intake on an app.  . Pt states she has made progress towards making healthy food choices.  . Pt is now eating breakfast to achieve 3 meals a day. (Previously was only eating 1 meal/day.)   NUTRITION DIAGNOSIS  Overweight/obesity ([place]-3.3) related to past poor dietary habits and physical inactivity as evidenced by patient w/ planned sleeve gastrectomy surgery  following dietary guidelines for continued weight loss.    NUTRITION INTERVENTION  Nutrition counseling (C-1) and education (E-2) to facilitate bariatric surgery goals.  New Pre-Op Goals to Work On . Make healthy food choices (continue this) . Aim for 64-100 ounces of fluid daily  . Practice not drinking 15 minutes before, during, and 30 minutes after each meal/snack   Handouts Provided Include   Bariatric Vitamins & Mineral Recommendations   Bariatric Surgery Protein Shakes   Learning Style & Readiness for Change Teaching method utilized: Visual & Auditory  Demonstrated degree of understanding via: Teach Back  Barriers to learning/adherence to lifestyle change: none identified   Pt states she is concerned about the sleeve surgery and potential impact on her acid reflux. Pt also had questions regarding her medications, such as how surgery will impact her ability to swallow large pills.    MONITORING & EVALUATION Dietary intake, weekly physical activity, body weight, and pre-op goals.   Next Steps  Notes for next SWL Visit: Assess progress made on previously set pre-op goals and prepare to set 1-2 new goals to work towards.

## 2018-08-11 NOTE — Telephone Encounter (Signed)
The coverage determination request has been: Approved Pennsaid 2% pump will be approved from 08/10/2018-10/04/2019.

## 2018-09-04 ENCOUNTER — Ambulatory Visit: Payer: PPO | Admitting: Sports Medicine

## 2018-09-05 ENCOUNTER — Telehealth: Payer: Self-pay | Admitting: Gastroenterology

## 2018-09-05 NOTE — Telephone Encounter (Signed)
Appt cancelled with Maudie Mercury at [facility] scheduling

## 2018-09-11 ENCOUNTER — Ambulatory Visit: Payer: Self-pay | Admitting: *Deleted

## 2018-09-11 ENCOUNTER — Ambulatory Visit: Payer: Self-pay | Admitting: Skilled Nursing Facility1

## 2018-09-11 NOTE — Telephone Encounter (Signed)
Patient is calling to report she has been having dizziness that is not omproving or going away. Appointment made for evaluation of dizziness.  Reason for Disposition . [1] MODERATE dizziness (e.g., interferes with normal activities) AND [2] has NOT been evaluated by physician for this  (Exception: dizziness caused by heat exposure, sudden standing, or poor fluid intake)  Answer Assessment - Initial Assessment Questions 1. DESCRIPTION: "Describe your dizziness."     Comes and goes- usually occurs after patient has been up and active  2. LIGHTHEADED: "Do you feel lightheaded?" (e.g., somewhat faint, woozy, weak upon standing)     Lightheaded- feels faint 3. VERTIGO: "Do you feel like either you or the room is spinning or tilting?" (i.e. vertigo)     Patient does report some vertigo symptom- tilting and spinning 4. SEVERITY: "How bad is it?"  "Do you feel like you are going to faint?" "Can you stand and walk?"   - MILD - walking normally   - MODERATE - interferes with normal activities (e.g., work, school)    - SEVERE - unable to stand, requires support to walk, feels like passing out now.      Vertigo symptoms are not as severe- moderate- because when occurring it interferes with life. 5. ONSET:  "When did the dizziness begin?"     2nd week now 6. AGGRAVATING FACTORS: "Does anything make it worse?" (e.g., standing, change in head position)     no 7. HEART RATE: "Can you tell me your heart rate?" "How many beats in 15 seconds?"  (Note: not all patients can do this)       In the beginning it felt like heart was going to jump out of chest- patient thought it was anxiety 8. CAUSE: "What do you think is causing the dizziness?"     Not sure 9. RECURRENT SYMPTOM: "Have you had dizziness before?" If so, ask: "When was the last time?" "What happened that time?"     Not to this extent 10. OTHER SYMPTOMS: "Do you have any other symptoms?" (e.g., fever, chest pain, vomiting, diarrhea, bleeding)  no 11. PREGNANCY: "Is there any chance you are pregnant?" "When was your last menstrual period?"       No- LMP- spotting in November- not active  Protocols used: DIZZINESS [facility]

## 2018-09-11 NOTE — Telephone Encounter (Signed)
See note

## 2018-09-11 NOTE — Telephone Encounter (Signed)
Noted  

## 2018-09-12 ENCOUNTER — Ambulatory Visit (INDEPENDENT_AMBULATORY_CARE_PROVIDER_SITE_OTHER)
Admission: RE | Admit: 2018-09-12 | Discharge: 2018-09-12 | Disposition: A | Discharge status: Home / Self Care | Payer: PPO | Source: Ambulatory Visit | Attending: Physician Assistant | Admitting: Physician Assistant

## 2018-09-12 ENCOUNTER — Encounter: Payer: Self-pay | Admitting: Physician Assistant

## 2018-09-12 ENCOUNTER — Other Ambulatory Visit: Payer: Self-pay | Admitting: Physician Assistant

## 2018-09-12 ENCOUNTER — Ambulatory Visit (INDEPENDENT_AMBULATORY_CARE_PROVIDER_SITE_OTHER): Payer: PPO | Admitting: Physician Assistant

## 2018-09-12 ENCOUNTER — Encounter: Payer: Self-pay | Admitting: Skilled Nursing Facility1

## 2018-09-12 ENCOUNTER — Encounter: Payer: PPO | Attending: Surgery | Admitting: Skilled Nursing Facility1

## 2018-09-12 VITALS — BP 128/80 | HR 101 | Temp 98.6°F | Ht 60.0 in | Wt 300.2 lb

## 2018-09-12 DIAGNOSIS — Z713 Dietary counseling and surveillance: Secondary | ICD-10-CM | POA: Diagnosis not present

## 2018-09-12 DIAGNOSIS — R29818 Other symptoms and signs involving the nervous system: Secondary | ICD-10-CM | POA: Diagnosis not present

## 2018-09-12 DIAGNOSIS — R42 Dizziness and giddiness: Secondary | ICD-10-CM | POA: Diagnosis not present

## 2018-09-12 DIAGNOSIS — R51 Headache: Secondary | ICD-10-CM | POA: Diagnosis not present

## 2018-09-12 LAB — COMPREHENSIVE METABOLIC PANEL
ALK PHOS: 69 U/L (ref 39–117)
ALT: 38 U/L — AB (ref 0–35)
AST: 27 U/L (ref 0–37)
Albumin: 4 g/dL (ref 3.5–5.2)
BILIRUBIN TOTAL: 0.4 mg/dL (ref 0.2–1.2)
BUN: 21 mg/dL (ref 6–23)
CALCIUM: 10.4 mg/dL (ref 8.4–10.5)
CO2: 29 meq/L (ref 19–32)
Chloride: 99 mEq/L (ref 96–112)
Creatinine, Ser: 0.82 mg/dL (ref 0.40–1.20)
GFR: 94.63 mL/min (ref >60.00)
Glucose, Bld: 111 mg/dL — ABNORMAL HIGH (ref 70–99)
POTASSIUM: 3.8 meq/L (ref 3.5–5.1)
Sodium: 137 mEq/L (ref 135–145)
TOTAL PROTEIN: 8.2 g/dL (ref 6.0–8.3)

## 2018-09-12 LAB — CBC WITH DIFFERENTIAL/PLATELET
BASOS PCT: 0.1 % (ref 0.0–3.0)
Basophils Absolute: 0 10*3/uL (ref 0.0–0.1)
EOS PCT: 0.8 % (ref 0.0–5.0)
Eosinophils Absolute: 0.1 10*3/uL (ref 0.0–0.7)
HEMATOCRIT: 29.2 % — AB (ref 36.0–46.0)
Hemoglobin: 9.4 g/dL — ABNORMAL LOW (ref 12.0–15.0)
LYMPHS ABS: 1.4 10*3/uL (ref 0.7–4.0)
LYMPHS PCT: 18.8 % (ref 12.0–46.0)
MCHC: 32.2 g/dL (ref 30.0–36.0)
MCV: 80.8 fl (ref 78.0–100.0)
MONOS PCT: 6.6 % (ref 3.0–12.0)
Monocytes Absolute: 0.5 10*3/uL (ref 0.1–1.0)
NEUTROS ABS: 5.4 10*3/uL (ref 1.4–7.7)
NEUTROS PCT: 73.7 % (ref 43.0–77.0)
PLATELETS: 366 10*3/uL (ref 150.0–400.0)
RBC: 3.62 Mil/uL — ABNORMAL LOW (ref 3.87–5.11)
RDW: 17.4 % — ABNORMAL HIGH (ref 11.5–15.5)
WBC: 7.3 10*3/uL (ref 4.0–10.5)

## 2018-09-12 LAB — PROTIME-INR
INR: 1.2 ratio — AB (ref 0.8–1.0)
Prothrombin Time: 13.9 s — ABNORMAL HIGH (ref 9.6–13.1)

## 2018-09-12 LAB — TSH: TSH: 0.95 u[IU]/mL (ref 0.35–4.50)

## 2018-09-12 NOTE — Progress Notes (Signed)
[name] is a 52 y.o. female here for a new problem.  I acted as a Education administrator for [employer], PA-C Anselmo Pickler, LPN  History of Present Illness:   Chief Complaint  Patient presents with  . Dizziness    Dizziness  This is a new problem. Episode onset: Started 2 weeks ago. The problem occurs intermittently. The problem has been gradually improving. Pertinent negatives include no congestion, coughing, fever, headaches, nausea or vomiting. Associated symptoms comments: When first started was having chills, diaphoresis, unsteady gait, headache, lightheaded. All symptoms have resolved except for dizziness off and on.. The symptoms are aggravated by walking. She has tried lying down (excedrin) for the symptoms. The treatment provided mild relief.   Describes her symptoms as lightheadedness more than anything.  She states that when this episode for started she had a sensation that her heart was beating very hard.  She did not go to the ER.  She has been following a strict diet from the bariatric program in order to lose weight prior to bariatric surgery, and she is down about 20 pounds.  She also states that prior to onset of her symptoms she was taking Prozac for about a month, so she has stopped to see if that was contributing to her symptoms.  She has still had ongoing intermittent dizziness and continued blurred vision.  She denies any double vision, slurred speech, numbness or tingling in arms, chest pain, shortness of breath.   Past Medical History:  Diagnosis Date  . Asthma   . Bipolar 1 disorder ([place])   . History of chicken pox   . Hypertension   . Sleep apnea   . Vitamin D deficiency      Social History   Socioeconomic History  . Marital status: Single    Spouse name: Not on file  . Number of children: Not on file  . Years of education: Not on file  . Highest education level: Not on file  Occupational History  . Not on file  Social Needs  . Financial resource strain:  Not on file  . Food insecurity:    Worry: Not on file    Inability: Not on file  . Transportation needs:    Medical: Not on file    Non-medical: Not on file  Tobacco Use  . Smoking status: Never Smoker  . Smokeless tobacco: Never Used  Substance and Sexual Activity  . Alcohol use: Yes    Alcohol/week: 1.0 - 2.0 standard drinks    Types: 1 - 2 Glasses of wine per week  . Drug use: No  . Sexual activity: Never  Lifestyle  . Physical activity:    Days per week: Not on file    Minutes per session: Not on file  . Stress: Not on file  Relationships  . Social connections:    Talks on phone: Not on file    Gets together: Not on file    Attends religious service: Not on file    Active member of club or organization: Not on file    Attends meetings of clubs or organizations: Not on file    Relationship status: Not on file  . Intimate partner violence:    Fear of current or ex partner: Not on file    Emotionally abused: Not on file    Physically abused: Not on file    Forced sexual activity: Not on file  Other Topics Concern  . Not on file  Social History Narrative  Lives in [place], was living in [place], [place] for 20 years   No children, not married   Single   Family is nearby, mom and sister nearby   Currently on disability: 4 years ago she had an episode of psychosis (states that she didn't sleep for ~6-8 days prior to) and was in and out of mental institution for 6 months, lost her job and had to move back to [place] to be near family    Past Surgical History:  Procedure Laterality Date  . BREAST REDUCTION SURGERY  1986  . Heart defect surgery    . TONSILLECTOMY      Family History  Problem Relation Age of Onset  . Diabetes Mother   . Hypertension Mother   . Stroke Father   . Diabetes Maternal Grandmother   . CVA Maternal Grandmother   . Diabetes Maternal Grandfather   . Colon cancer Paternal Grandmother   . Diabetes Paternal Grandfather   . Breast cancer Maternal  Aunt   . Asthma Other   . Cancer Other   . COPD Other     No Known Allergies  Current Medications:   Current Outpatient Medications:  .  ARIPiprazole (ABILIFY) 5 MG tablet, Take 5 mg by mouth at bedtime, Disp: , Rfl: 1 .  aspirin-acetaminophen-caffeine (EXCEDRIN MIGRAINE) 250-250-65 MG tablet, Take 3 tablets by mouth every 6 (six) hours as needed for headache., Disp: , Rfl:  .  Diclofenac Sodium (PENNSAID) 2 % SOLN, Place 1 application onto the skin 2 (two) times daily., Disp: 112 g, Rfl: 2 .  doxepin (SINEQUAN) 25 MG capsule, Take 50 mg by mouth at bedtime, Disp: , Rfl: 1 .  lisinopril-hydrochlorothiazide (PRINZIDE,ZESTORETIC) 20-25 MG tablet, Take 1 tablet by mouth daily., Disp: 90 tablet, Rfl: 1 .  metFORMIN (GLUCOPHAGE) 500 MG tablet, TAKE 1 TABLET BY MOUTH EVERY DAY WITH BREAKFAST, Disp: 90 tablet, Rfl: 0 .  pantoprazole (PROTONIX) 40 MG tablet, Take 1 tablet (40 mg total) by mouth every morning., Disp: 30 tablet, Rfl: 3   Review of Systems:   Review of Systems  Constitutional: Negative for fever.  HENT: Negative for congestion.   Respiratory: Negative for cough.   Gastrointestinal: Negative for nausea and vomiting.  Neurological: Positive for dizziness. Negative for headaches.    Vitals:   Vitals:   09/12/18 0843  BP: 128/80  Pulse: (!) 101  Temp: 98.6 F (37 C)  TempSrc: Oral  SpO2: 96%  Weight: (!) 300 lb 4 oz (136.2 kg)  Height: 5' (1.524 m)     Body mass index is 58.64 kg/m.  Physical Exam:   Physical Exam  Constitutional: She appears well-developed. She is cooperative.  Non-toxic appearance. She does not have a sickly appearance. She does not appear ill. No distress.  Cardiovascular: Regular rhythm, S1 normal, S2 normal, normal heart sounds and normal pulses. Tachycardia present.  No LE edema  Pulmonary/Chest: Effort normal and breath sounds normal.  Neurological: She is alert. She has normal strength. No cranial nerve deficit or sensory deficit.  Coordination and gait normal. GCS eye subscore is 4. GCS verbal subscore is 5. GCS motor subscore is 6.  Skin: Skin is warm, dry and intact.  Psychiatric: She has a normal mood and affect. Her speech is normal and behavior is normal.  Nursing note and vitals reviewed.   EKG tracing is personally reviewed.  EKG notes NSR.  No acute changes.    Assessment and Plan:   Sanjuanita was seen today for dizziness.  Diagnoses and all orders for this visit:  Dizziness -     EKG 12-Lead -     CBC with Differential/Platelet -     Comprehensive metabolic panel -     TSH -     Protime-INR  Other symptoms and signs involving the nervous system -     CT Head Wo Contrast; Future   EKG is without changes compared to prior.  We will check labs as well as an INR today.  Stat CT scan.  Low threshold to go to ER if any symptoms return.  She also states that she does have a neurologist who manages her sleep apnea, and she says she will reach out to them to schedule an appointment for dizziness.  I also encouraged her to try to work on making sure she is getting adequate nutrition throughout the day despite her current diet.  Push fluids.  Further intervention to based on labs and imaging results.  . Reviewed expectations re: course of current medical issues. . Discussed self-management of symptoms. . Outlined signs and symptoms indicating need for more acute intervention. . Patient verbalized understanding and all questions were answered. . See orders for this visit as documented in the electronic medical record. . Patient received an After-Visit Summary.  CMA or LPN served as scribe during this visit. History, Physical, and Plan performed by medical provider. The above documentation has been reviewed and is accurate and complete.  [name], PA-C

## 2018-09-12 NOTE — Progress Notes (Signed)
Bariatric Supervised Weight Loss Visit Appt Start Time: 10:30am  End Time: 11:00am  Planned Surgery: Sleeve Gastrectomy   1st out of 6 SWL Appointments   NUTRITION ASSESSMENT  Anthropometrics  Start weight at [facility]: 313.2 lbs Today's weight: 310.2 lbs  299 Weight change: 11 lbs (since 09-12-2018) BMI: 60.6 kg/m2    Psychosocial/Lifestyle Pt states she lives alone other than with her new 13 month old puppy.    24-Hr Dietary Recall First Meal: protein shake  Snack: none Second Meal: [place] sandwich or PBJ Snack: 12 chips Third Meal: meat + salad  Snack: none Beverages: water, coffee, tea   Food & Nutrition Related Hx Dietary Hx: Pt states she is eating 3 meals per day. Pt states sometimes her puppy wakes her up and she has trouble going back to sleep. Estimated Daily Fluid Intake: 50-64 oz GI / Other Notable Symptoms: acid reflux   Pt states she has been experiencing dizziness which she will be evaluated for. Pt states she is not 100% sure she wants surgery yet so will reevaluate this at the end of the 6 months. Pt states she feels she has been doing really and her family has told her she has done well. Pt states did well with not drinking with meals. Pt states she is aiming for 1800-1900 calories per day. Pt states he has to have routine for her mental health stating she has bipolar. Pt states she wants to get on he treadmill but needs to figure out her dizziness first.   Physical Activity  Current average weekly physical activity: none. Pt states she has an elliptical at home to use and plans to start walking her new puppy.    Estimated Energy Needs Calories: 1600 Carbohydrate: 180g Protein: 120g Fat: 44g  Progress Towards Pre-Op Goals Previously Chosen . Pt states she is very proud of herself for giving up soda.  . Pt records her food intake on an app.  . Pt states she has made progress towards making healthy food choices: paying close attention to serving . Pt is now  eating breakfast to achieve 3 meals a day. (Previously was only eating 1 meal/day) . Practice not drinking 15 minutes before, during, and 30 minutes after each meal/snack    NUTRITION DIAGNOSIS  Overweight/obesity (Cave-3.3) related to past poor dietary habits and physical inactivity as evidenced by patient w/ planned sleeve gastrectomy surgery following dietary guidelines for continued weight loss.    NUTRITION INTERVENTION  Nutrition counseling (C-1) and education (E-2) to facilitate bariatric surgery goals.  New Pre-Op Goals to Work On . Make healthy food choices (continue this) . Aim for 64-100 ounces of fluid daily  . Practice not drinking 15 minutes before, during, and 30 minutes after each meal/snack  . Have non starchy vegetables with lunch and dinner 7 days a week  Handouts Provided Include   Bariatric Vitamins & Mineral Recommendations   Bariatric Surgery Protein Shakes   Learning Style & Readiness for Change Teaching method utilized: Visual & Auditory  Demonstrated degree of understanding via: Teach Back  Barriers to learning/adherence to lifestyle change: none identified   MONITORING & EVALUATION Dietary intake, weekly physical activity, body weight, and pre-op goals.   Next Steps  Notes for next SWL Visit: Assess progress made on previously set pre-op goals and prepare to set 1-2 new goals to work towards.

## 2018-09-12 NOTE — Patient Instructions (Signed)
It was great to see you!  EKG looks okay.  If you develop worsening headache or any other symptoms --> GO TO THE ER.  I would like for you to call [facility] to make an appointment regarding your dizziness.  Please obtain CT scan of head.  Take care,  [name] PA-C

## 2018-09-12 NOTE — Patient Instructions (Signed)
.   Continue to not drinking 15 minutes before, during, and 30 minutes after each meal/snack   . Have non starchy vegetables with lunch and dinner 7 days a week  . Keep up the [place] Work!!

## 2018-09-18 ENCOUNTER — Encounter (HOSPITAL_COMMUNITY): Payer: Self-pay

## 2018-09-18 ENCOUNTER — Ambulatory Visit (HOSPITAL_COMMUNITY): Admit: 2018-09-18 | Payer: PPO | Admitting: Gastroenterology

## 2018-09-18 SURGERY — COLONOSCOPY WITH PROPOFOL
Anesthesia: Monitor Anesthesia Care

## 2018-09-28 ENCOUNTER — Telehealth: Payer: Self-pay | Admitting: Gastroenterology

## 2018-09-28 NOTE — Telephone Encounter (Signed)
Pt is wanting to resched colon that was originally sched at hospital.  She is wanting to know if she can resched in [facility] or if it has to be at [facility].  Please advise.

## 2018-10-02 ENCOUNTER — Ambulatory Visit (INDEPENDENT_AMBULATORY_CARE_PROVIDER_SITE_OTHER): Payer: PPO | Admitting: Physician Assistant

## 2018-10-02 ENCOUNTER — Telehealth: Payer: Self-pay | Admitting: Gastroenterology

## 2018-10-02 ENCOUNTER — Encounter: Payer: Self-pay | Admitting: Physician Assistant

## 2018-10-02 VITALS — BP 128/80 | HR 101 | Temp 99.1°F | Ht 60.0 in | Wt 302.2 lb

## 2018-10-02 DIAGNOSIS — R710 Precipitous drop in hematocrit: Secondary | ICD-10-CM | POA: Diagnosis not present

## 2018-10-02 DIAGNOSIS — R42 Dizziness and giddiness: Secondary | ICD-10-CM

## 2018-10-02 LAB — CBC WITH DIFFERENTIAL/PLATELET
BASOS PCT: 0.2 % (ref 0.0–3.0)
Basophils Absolute: 0 10*3/uL (ref 0.0–0.1)
EOS ABS: 0.2 10*3/uL (ref 0.0–0.7)
EOS PCT: 1.7 % (ref 0.0–5.0)
HCT: 28.9 % — ABNORMAL LOW (ref 36.0–46.0)
Hemoglobin: 9.2 g/dL — ABNORMAL LOW (ref 12.0–15.0)
LYMPHS ABS: 1.3 10*3/uL (ref 0.7–4.0)
Lymphocytes Relative: 13.7 % (ref 12.0–46.0)
MCHC: 31.8 g/dL (ref 30.0–36.0)
MCV: 81.1 fl (ref 78.0–100.0)
MONO ABS: 0.5 10*3/uL (ref 0.1–1.0)
Monocytes Relative: 5 % (ref 3.0–12.0)
NEUTROS ABS: 7.3 10*3/uL (ref 1.4–7.7)
Neutrophils Relative %: 79.4 % — ABNORMAL HIGH (ref 43.0–77.0)
PLATELETS: 401 10*3/uL — AB (ref 150.0–400.0)
RBC: 3.56 Mil/uL — ABNORMAL LOW (ref 3.87–5.11)
RDW: 17.2 % — ABNORMAL HIGH (ref 11.5–15.5)
WBC: 9.2 10*3/uL (ref 4.0–10.5)

## 2018-10-02 NOTE — Progress Notes (Signed)
[name] is a 52 y.o. female is here to follow up on dizziness.  I acted as a Education administrator for [employer], PA-C Anselmo Pickler, LPN  History of Present Illness:   Chief Complaint  Patient presents with  . Dizziness    Dizziness  Chronicity: Pt here to follow up today. Episode onset: Started 3 weeks ago. The problem occurs intermittently (Having episodes every 4-5 days lasting 2-3 minutes.). The problem has been gradually improving. Associated symptoms include fatigue. Pertinent negatives include no chest pain, chills, coughing, fever, headaches, joint swelling, nausea, sore throat, swollen glands, visual change or vomiting. Nothing aggravates the symptoms. She has tried relaxation for the symptoms.   Wt Readings from Last 5 Encounters:  10/02/18 (!) 302 lb 4 oz (137.1 kg)  09/12/18 299 lb 11.2 oz (135.9 kg)  09/12/18 (!) 300 lb 4 oz (136.2 kg)  08/10/18 (!) 310 lb 4.8 oz (140.8 kg)  08/08/18 (!) 312 lb 9.6 oz (141.8 kg)   BP Readings from Last 3 Encounters:  10/02/18 128/80  09/12/18 128/80  08/08/18 110/78   She has started taking a prenatal MVI and is consistent with this. Last visit Hgb was 9.4. She had to put her colonoscopy on hold last time she was here because of her dizziness, however she knows the importance of re-scheduling this and plans to do so soon. Denies blood stools.  Lab Results  Component Value Date   HGB 9.4 (L) 09/12/2018   She was instructed to call her neurologist (who manages her OSA) for follow-up at her last appointment but did not.  Health Maintenance Due  Topic Date Due  . PAP SMEAR-Modifier  10/09/2018    Past Medical History:  Diagnosis Date  . Asthma   . Bipolar 1 disorder ([place])   . History of chicken pox   . Hypertension   . Sleep apnea   . Vitamin D deficiency      Social History   Socioeconomic History  . Marital status: Single    Spouse name: Not on file  . Number of children: Not on file  . Years of education: Not on  file  . Highest education level: Not on file  Occupational History  . Not on file  Social Needs  . Financial resource strain: Not on file  . Food insecurity:    Worry: Not on file    Inability: Not on file  . Transportation needs:    Medical: Not on file    Non-medical: Not on file  Tobacco Use  . Smoking status: Never Smoker  . Smokeless tobacco: Never Used  Substance and Sexual Activity  . Alcohol use: Yes    Alcohol/week: 1.0 - 2.0 standard drinks    Types: 1 - 2 Glasses of wine per week  . Drug use: No  . Sexual activity: Never  Lifestyle  . Physical activity:    Days per week: Not on file    Minutes per session: Not on file  . Stress: Not on file  Relationships  . Social connections:    Talks on phone: Not on file    Gets together: Not on file    Attends religious service: Not on file    Active member of club or organization: Not on file    Attends meetings of clubs or organizations: Not on file    Relationship status: Not on file  . Intimate partner violence:    Fear of current or ex partner: Not on file  Emotionally abused: Not on file    Physically abused: Not on file    Forced sexual activity: Not on file  Other Topics Concern  . Not on file  Social History Narrative   Lives in [place], was living in [place], [place] for 20 years   No children, not married   Single   Family is nearby, mom and sister nearby   Currently on disability: 4 years ago she had an episode of psychosis (states that she didn't sleep for ~6-8 days prior to) and was in and out of mental institution for 6 months, lost her job and had to move back to [place] to be near family    Past Surgical History:  Procedure Laterality Date  . BREAST REDUCTION SURGERY  1986  . Heart defect surgery    . TONSILLECTOMY      Family History  Problem Relation Age of Onset  . Diabetes Mother   . Hypertension Mother   . Stroke Father   . Diabetes Maternal Grandmother   . CVA Maternal Grandmother   .  Diabetes Maternal Grandfather   . Colon cancer Paternal Grandmother   . Diabetes Paternal Grandfather   . Breast cancer Maternal Aunt   . Asthma Other   . Cancer Other   . COPD Other     PMHx, SurgHx, SocialHx, FamHx, Medications, and Allergies were reviewed in the Visit Navigator and updated as appropriate.   Patient Active Problem List   Diagnosis Date Noted  . Knee pain, chronic 04/10/2018  . Insulin resistance 04/10/2018  . Vitamin D deficiency   . Sleep apnea 03/31/2017  . Morbid obesity ([place]) 03/31/2017  . Hypertension 03/31/2017  . Bipolar 1 disorder ([place]) 03/31/2017    Social History   Tobacco Use  . Smoking status: Never Smoker  . Smokeless tobacco: Never Used  Substance Use Topics  . Alcohol use: Yes    Alcohol/week: 1.0 - 2.0 standard drinks    Types: 1 - 2 Glasses of wine per week  . Drug use: No    Current Medications and Allergies:    Current Outpatient Medications:  .  ARIPiprazole (ABILIFY) 5 MG tablet, Take 5 mg by mouth at bedtime, Disp: , Rfl: 1 .  Diclofenac Sodium (PENNSAID) 2 % SOLN, Place 1 application onto the skin 2 (two) times daily., Disp: 112 g, Rfl: 2 .  doxepin (SINEQUAN) 25 MG capsule, Take 50 mg by mouth at bedtime, Disp: , Rfl: 1 .  lisinopril-hydrochlorothiazide (PRINZIDE,ZESTORETIC) 20-25 MG tablet, Take 1 tablet by mouth daily., Disp: 90 tablet, Rfl: 1 .  metFORMIN (GLUCOPHAGE) 500 MG tablet, TAKE 1 TABLET BY MOUTH EVERY DAY WITH BREAKFAST, Disp: 90 tablet, Rfl: 0 .  pantoprazole (PROTONIX) 40 MG tablet, Take 1 tablet (40 mg total) by mouth every morning., Disp: 30 tablet, Rfl: 3  No Known Allergies  Review of Systems   Review of Systems  Constitutional: Positive for fatigue. Negative for chills and fever.  HENT: Negative for sore throat.   Respiratory: Negative for cough.   Cardiovascular: Negative for chest pain.  Gastrointestinal: Negative for nausea and vomiting.  Musculoskeletal: Negative for joint swelling.    Neurological: Positive for dizziness. Negative for headaches.    Vitals:   Vitals:   10/02/18 1113  BP: 128/80  Pulse: (!) 101  Temp: 99.1 F (37.3 C)  TempSrc: Oral  SpO2: 99%  Weight: (!) 302 lb 4 oz (137.1 kg)  Height: 5' (1.524 m)     Body mass index  is 59.03 kg/m.   Physical Exam:    Physical Exam Vitals signs and nursing note reviewed.  Constitutional:      General: She is not in acute distress.    Appearance: She is well-developed. She is not ill-appearing or toxic-appearing.  Cardiovascular:     Rate and Rhythm: Normal rate and regular rhythm.     Pulses: Normal pulses.     Heart sounds: Normal heart sounds, S1 normal and S2 normal.     Comments: No LE edema Pulmonary:     Effort: Pulmonary effort is normal.     Breath sounds: Normal breath sounds.  Skin:    General: Skin is warm and dry.  Neurological:     Mental Status: She is alert.     GCS: GCS eye subscore is 4. GCS verbal subscore is 5. GCS motor subscore is 6.     Cranial Nerves: Cranial nerves are intact.     Sensory: Sensation is intact.  Psychiatric:        Speech: Speech normal.        Behavior: Behavior normal. Behavior is cooperative.      Assessment and Plan:    Preslee was seen today for dizziness.  Diagnoses and all orders for this visit:  Decreased hemoglobin Re-check today. Consider transfusion if levels continues to decrease. Discussed importance of obtaining scheduled colonoscopy. Patient verbalized understanding. Red flags reviewed with patient. -     CBC with Differential/Platelet -     Iron, TIBC and Ferritin Panel  Dizziness Overall improving. No red flags on exam. Will r/o worsening anemia. She is already established at [facility], discussed that she needs to call and make an appointment for further work-up of this. Patient verbalized understanding.  . Reviewed expectations re: course of current medical issues. . Discussed self-management of symptoms. . Outlined  signs and symptoms indicating need for more acute intervention. . Patient verbalized understanding and all questions were answered. . See orders for this visit as documented in the electronic medical record. . Patient received an After Visit Summary.   CMA or LPN served as scribe during this visit. History, Physical, and Plan performed by medical provider. The above documentation has been reviewed and is accurate and complete.  [name], PA-C [place], [place] 10/02/2018  Follow-up: No follow-ups on file.

## 2018-10-02 NOTE — Telephone Encounter (Signed)
Pt is returning your call

## 2018-10-02 NOTE — Patient Instructions (Signed)
It was great to see you!  1. Please call Dr. Star Age at [facility] -- you last saw her in July 2019.  2. Please call and make an appointment with a NEW psychiatrist!  Follow-up will be based on your lab results.  Take care,  [name] PA-C

## 2018-10-02 NOTE — Telephone Encounter (Signed)
See other phone note from today for details

## 2018-10-02 NOTE — Telephone Encounter (Signed)
Left message for patient to call back  

## 2018-10-02 NOTE — Telephone Encounter (Signed)
Patient aware that she is placed on a waiting ist and we will call her when the March schedule comes out.

## 2018-10-03 LAB — IRON,TIBC AND FERRITIN PANEL
%SAT: 9 % (calc) — ABNORMAL LOW (ref 16–45)
Ferritin: 50 ng/mL (ref 16–232)
IRON: 31 ug/dL — AB (ref 45–160)
TIBC: 346 ug/dL (ref 250–450)

## 2018-10-04 DIAGNOSIS — R0600 Dyspnea, unspecified: Secondary | ICD-10-CM

## 2018-10-04 HISTORY — DX: Dyspnea, unspecified: R06.00

## 2018-10-06 ENCOUNTER — Telehealth: Payer: Self-pay | Admitting: Physician Assistant

## 2018-10-06 DIAGNOSIS — F3132 Bipolar disorder, current episode depressed, moderate: Secondary | ICD-10-CM | POA: Diagnosis not present

## 2018-10-06 DIAGNOSIS — F3181 Bipolar II disorder: Secondary | ICD-10-CM | POA: Diagnosis not present

## 2018-10-06 DIAGNOSIS — F319 Bipolar disorder, unspecified: Secondary | ICD-10-CM

## 2018-10-06 NOTE — Addendum Note (Signed)
Addended by: Marian Sorrow on: 10/06/2018 04:30 PM   Modules accepted: Orders

## 2018-10-06 NOTE — Telephone Encounter (Signed)
See note  Copied from [place] [id number]. Topic: Referral - Request for Referral >> Oct 06, 2018  3:47 PM Bea Graff, NT wrote: Has patient seen PCP for this complaint? Yes.   *If NO, is insurance requiring patient see PCP for this issue before PCP can refer them? Referral for which specialty: Psych Preferred provider/office: Dr. Sharyon Medicus Reason for referral: Pt was referred to him by [name] but his practice requires referral to be sent.

## 2018-10-06 NOTE — Telephone Encounter (Signed)
Spoke to pt told her order for referral to Dr. Sharyon Medicus was done and our referral coordinator will send it over. Pt verbalized understanding.

## 2018-10-12 ENCOUNTER — Encounter: Payer: Self-pay | Admitting: Skilled Nursing Facility1

## 2018-10-12 ENCOUNTER — Ambulatory Visit: Payer: PPO | Admitting: Physician Assistant

## 2018-10-12 ENCOUNTER — Encounter: Payer: PPO | Attending: Surgery | Admitting: Skilled Nursing Facility1

## 2018-10-12 DIAGNOSIS — Z713 Dietary counseling and surveillance: Secondary | ICD-10-CM | POA: Diagnosis not present

## 2018-10-12 NOTE — Patient Instructions (Addendum)
-  Continue to work on pre planning your meals  -Continue to Have non starchy vegetables with lunch and dinner 7 days a week  -Continue to work on your relationship with food identifying stress or board eating   -Continue to not drink with meals

## 2018-10-12 NOTE — Progress Notes (Signed)
Bariatric Supervised Weight Loss Visit Appt Start Time: 10:30am  End Time: 11:00am  Planned Surgery: Sleeve Gastrectomy   3rd out of 6 SWL Appointments   NUTRITION ASSESSMENT  Anthropometrics  Start weight at [facility]: 313.2 lbs Today's weight: 310.2 lbs  299 Weight change: 11 lbs  BMI: 58.49 kg/m2    Psychosocial/Lifestyle Pt states she lives alone other than with her new 87 month old puppy.    24-Hr Dietary Recall First Meal: protein shake or low sugar oatmeal Snack: none Second Meal: [place] sandwich with veggie chips or PBJ or salad  Snack: 12 chips or popcorn Third Meal: chicken or fish + salad + mashed potatoes or rice Snack: none Beverages: water  Food & Nutrition Related Hx Dietary Hx: Pt states she is eating 3 meals per day. Pt states sometimes her puppy wakes her up and she has trouble going back to sleep. Estimated Daily Fluid Intake: 50-64 oz GI / Other Notable Symptoms: acid reflux   Pt states she is aiming for 1800-1900 calories per day. Pt states he has to have routine for her mental health stating she has bipolar. Pt states she wants to get on he treadmill but needs to figure out her dizziness first.   Pt states she did not do well this past month. Pt states she has had some medical issues going on talking about low iron and dizziness. Pt states he feels she has done well with increasing her leafy greens and preparing food the night before. Pt state she has recognized when she does not sleep well and is stress she turns to sugar. Pt states she feels her mental health medications are partly responsible for her sugar cravings. Pt states no one in her family ant her to do this surgery. Pt states she does talk therapy. Pt states she will call her surgeon about her worries of having reflux after the sleeve. Pt states she is looking for a new psychiatrist and likes her therapist.   Physical Activity  Current average weekly physical activity: none. Pt states she has an  elliptical at home to use and plans to start walking her new puppy.    Estimated Energy Needs Calories: 1600 Carbohydrate: 180g Protein: 120g Fat: 44g  Progress Towards Pre-Op Goals Previously Chosen . Pt states she is very proud of herself for giving up soda.  . Pt records her food intake on an app.  . Pt states she has made progress towards making healthy food choices: paying close attention to serving . Pt is now eating breakfast to achieve 3 meals a day. (Previously was only eating 1 meal/day) . Practice not drinking 15 minutes before, during, and 30 minutes after each meal/snack  . Preparing meals before the day . Working on balancing meals   NUTRITION DIAGNOSIS  Overweight/obesity ([place]-3.3) related to past poor dietary habits and physical inactivity as evidenced by patient w/ planned sleeve gastrectomy surgery following dietary guidelines for continued weight loss.    NUTRITION INTERVENTION  Nutrition counseling (C-1) and education (E-2) to facilitate bariatric surgery goals.  New Pre-Op Goals to Work On . Make healthy food choices (continue this) . Practice not drinking 15 minutes before, during, and 30 minutes after each meal/snack  . Continue to Have non starchy vegetables with lunch and dinner 7 days a week . Prepare your meals in advance . Continue to not drink with meals   Handouts Provided Include   Bariatric Vitamins & Mineral Recommendations   Bariatric Surgery Protein Shakes  Learning Style & Readiness for Change Teaching method utilized: Visual & Auditory  Demonstrated degree of understanding via: Teach Back  Barriers to learning/adherence to lifestyle change: none identified   MONITORING & EVALUATION Dietary intake, weekly physical activity, body weight, and pre-op goals.   Next Steps  Notes for next SWL Visit: Assess progress made on previously set pre-op goals and prepare to set 1-2 new goals to work towards.

## 2018-11-03 ENCOUNTER — Telehealth: Payer: Self-pay

## 2018-11-03 NOTE — Telephone Encounter (Signed)
Patient has been rescheduled for endo/colon on 12/04/18 at [facility].  She will come in on 11/21/18 for pre-visit

## 2018-11-03 NOTE — Telephone Encounter (Signed)
-----   Message from Marlon Pel, RN sent at 10/02/2018  4:11 PM EST ----- Needs hospital case for [place] see notes 10/02/18

## 2018-11-06 ENCOUNTER — Encounter: Payer: Self-pay | Admitting: Gastroenterology

## 2018-11-08 ENCOUNTER — Encounter: Payer: PPO | Attending: Surgery | Admitting: Skilled Nursing Facility1

## 2018-11-08 ENCOUNTER — Encounter: Payer: Self-pay | Admitting: Skilled Nursing Facility1

## 2018-11-08 DIAGNOSIS — Z713 Dietary counseling and surveillance: Secondary | ICD-10-CM | POA: Diagnosis not present

## 2018-11-08 NOTE — Patient Instructions (Addendum)
-  Get back to drinking 64 ounces of water  -Dry mouth=not drinking enough  -Use the [employer] service to grocery shop for you  -When feeling anxiety/depression get out of the house and go to the park  -[facility]   -Go to support group  -Get back into photography
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
## 2018-11-13 DIAGNOSIS — F5101 Primary insomnia: Secondary | ICD-10-CM | POA: Diagnosis not present

## 2018-11-13 DIAGNOSIS — F3132 Bipolar disorder, current episode depressed, moderate: Secondary | ICD-10-CM | POA: Diagnosis not present

## 2018-11-21 ENCOUNTER — Ambulatory Visit (AMBULATORY_SURGERY_CENTER): Payer: Self-pay

## 2018-11-21 ENCOUNTER — Other Ambulatory Visit: Payer: Self-pay

## 2018-11-21 VITALS — Ht 60.0 in | Wt 304.8 lb

## 2018-11-21 DIAGNOSIS — K219 Gastro-esophageal reflux disease without esophagitis: Secondary | ICD-10-CM

## 2018-11-21 DIAGNOSIS — Z1211 Encounter for screening for malignant neoplasm of colon: Secondary | ICD-10-CM

## 2018-11-21 NOTE — Progress Notes (Signed)
No egg or soy allergy known to patient  No issues with past sedation with any surgeries  or procedures, no intubation problems  No diet pills per patient No home 02 use per patient  No blood thinners per patient  Pt denies issues with constipation  No A fib or A flutter  EMMI video sent to pt's e mail  

## 2018-12-02 NOTE — Anesthesia Preprocedure Evaluation (Addendum)
Anesthesia Evaluation  Patient identified by MRN, date of birth, ID band Patient awake    Reviewed: Allergy & Precautions, NPO status , Patient's Chart, lab work & pertinent test results  Airway Mallampati: II  TM Distance: >3 FB Neck ROM: Full    Dental no notable dental hx. (+) Teeth Intact   Pulmonary asthma , sleep apnea and Continuous Positive Airway Pressure Ventilation ,    Pulmonary exam normal breath sounds clear to auscultation       Cardiovascular Exercise Tolerance: Good hypertension, Pt. on medications Normal cardiovascular exam Rhythm:Regular Rate:Normal  09/12/18 SR R 98   Neuro/Psych PSYCHIATRIC DISORDERS Bipolar Disorder negative neurological ROS     GI/Hepatic Neg liver ROS, GERD  ,  Endo/Other  diabetes, Type 2Morbid obesity  Renal/GU      Musculoskeletal negative musculoskeletal ROS (+)   Abdominal (+) + obese,   Peds  Hematology  (+) Blood dyscrasia, anemia ,   Anesthesia Other Findings   Reproductive/Obstetrics                           Anesthesia Physical Anesthesia Plan  ASA: IV  Anesthesia Plan: MAC   Post-op Pain Management:    Induction: Intravenous  PONV Risk Score and Plan: 2 and Treatment may vary due to age or medical condition, Ondansetron and Dexamethasone  Airway Management Planned: Nasal Cannula and Natural Airway  Additional Equipment:   Intra-op Plan:   Post-operative Plan:   Informed Consent: I have reviewed the patients History and Physical, chart, labs and discussed the procedure including the risks, benefits and alternatives for the proposed anesthesia with the patient or authorized representative who has indicated his/her understanding and acceptance.     Dental advisory given  Plan Discussed with: CRNA  Anesthesia Plan Comments: (EGD & Colonosscopy)      Anesthesia Quick Evaluation

## 2018-12-04 ENCOUNTER — Ambulatory Visit (HOSPITAL_COMMUNITY)
Admission: RE | Admit: 2018-12-04 | Discharge: 2018-12-04 | Disposition: A | Discharge status: Home / Self Care | Payer: PPO | Attending: Gastroenterology | Admitting: Gastroenterology

## 2018-12-04 ENCOUNTER — Other Ambulatory Visit: Payer: Self-pay

## 2018-12-04 ENCOUNTER — Encounter (HOSPITAL_COMMUNITY): Payer: Self-pay | Admitting: *Deleted

## 2018-12-04 ENCOUNTER — Ambulatory Visit (HOSPITAL_COMMUNITY): Payer: PPO | Admitting: Anesthesiology

## 2018-12-04 ENCOUNTER — Encounter (HOSPITAL_COMMUNITY)
Admission: RE | Disposition: A | Discharge status: Home / Self Care | Payer: Self-pay | Source: Home / Self Care | Attending: Gastroenterology

## 2018-12-04 DIAGNOSIS — K5900 Constipation, unspecified: Secondary | ICD-10-CM | POA: Diagnosis not present

## 2018-12-04 DIAGNOSIS — E739 Lactose intolerance, unspecified: Secondary | ICD-10-CM | POA: Diagnosis not present

## 2018-12-04 DIAGNOSIS — G4730 Sleep apnea, unspecified: Secondary | ICD-10-CM | POA: Insufficient documentation

## 2018-12-04 DIAGNOSIS — J45909 Unspecified asthma, uncomplicated: Secondary | ICD-10-CM | POA: Diagnosis not present

## 2018-12-04 DIAGNOSIS — Z1211 Encounter for screening for malignant neoplasm of colon: Secondary | ICD-10-CM

## 2018-12-04 DIAGNOSIS — D128 Benign neoplasm of rectum: Secondary | ICD-10-CM | POA: Insufficient documentation

## 2018-12-04 DIAGNOSIS — D122 Benign neoplasm of ascending colon: Secondary | ICD-10-CM | POA: Insufficient documentation

## 2018-12-04 DIAGNOSIS — Z79899 Other long term (current) drug therapy: Secondary | ICD-10-CM | POA: Insufficient documentation

## 2018-12-04 DIAGNOSIS — E559 Vitamin D deficiency, unspecified: Secondary | ICD-10-CM | POA: Diagnosis not present

## 2018-12-04 DIAGNOSIS — R109 Unspecified abdominal pain: Secondary | ICD-10-CM

## 2018-12-04 DIAGNOSIS — D649 Anemia, unspecified: Secondary | ICD-10-CM | POA: Diagnosis not present

## 2018-12-04 DIAGNOSIS — K2970 Gastritis, unspecified, without bleeding: Secondary | ICD-10-CM | POA: Diagnosis not present

## 2018-12-04 DIAGNOSIS — B9681 Helicobacter pylori [H. pylori] as the cause of diseases classified elsewhere: Secondary | ICD-10-CM | POA: Diagnosis not present

## 2018-12-04 DIAGNOSIS — D124 Benign neoplasm of descending colon: Secondary | ICD-10-CM

## 2018-12-04 DIAGNOSIS — K635 Polyp of colon: Secondary | ICD-10-CM

## 2018-12-04 DIAGNOSIS — K621 Rectal polyp: Secondary | ICD-10-CM | POA: Diagnosis not present

## 2018-12-04 DIAGNOSIS — F319 Bipolar disorder, unspecified: Secondary | ICD-10-CM | POA: Diagnosis not present

## 2018-12-04 DIAGNOSIS — K2950 Unspecified chronic gastritis without bleeding: Secondary | ICD-10-CM | POA: Insufficient documentation

## 2018-12-04 DIAGNOSIS — Z6841 Body Mass Index (BMI) 40.0 and over, adult: Secondary | ICD-10-CM | POA: Diagnosis not present

## 2018-12-04 DIAGNOSIS — Z7984 Long term (current) use of oral hypoglycemic drugs: Secondary | ICD-10-CM | POA: Insufficient documentation

## 2018-12-04 DIAGNOSIS — K5730 Diverticulosis of large intestine without perforation or abscess without bleeding: Secondary | ICD-10-CM | POA: Insufficient documentation

## 2018-12-04 DIAGNOSIS — I10 Essential (primary) hypertension: Secondary | ICD-10-CM | POA: Insufficient documentation

## 2018-12-04 HISTORY — PX: BIOPSY: SHX5522

## 2018-12-04 HISTORY — PX: COLONOSCOPY WITH PROPOFOL: SHX5780

## 2018-12-04 HISTORY — PX: ESOPHAGOGASTRODUODENOSCOPY (EGD) WITH PROPOFOL: SHX5813

## 2018-12-04 LAB — GLUCOSE, CAPILLARY: Glucose-Capillary: 104 mg/dL — ABNORMAL HIGH (ref 70–99)

## 2018-12-04 LAB — PREGNANCY, URINE: Preg Test, Ur: NEGATIVE

## 2018-12-04 SURGERY — COLONOSCOPY WITH PROPOFOL
Anesthesia: Monitor Anesthesia Care

## 2018-12-04 MED ORDER — ONDANSETRON HCL 4 MG/2ML IJ SOLN
INTRAMUSCULAR | Status: DC | PRN
  Administered 2018-12-04: 4 mg via INTRAVENOUS

## 2018-12-04 MED ORDER — LACTATED RINGERS IV SOLN
INTRAVENOUS | Status: DC
  Administered 2018-12-04: 1000 mL via INTRAVENOUS

## 2018-12-04 MED ORDER — PROPOFOL 10 MG/ML IV BOLUS
INTRAVENOUS | Status: AC
  Filled 2018-12-04: qty 20

## 2018-12-04 MED ORDER — PROPOFOL 10 MG/ML IV BOLUS
INTRAVENOUS | Status: AC
  Filled 2018-12-04: qty 60

## 2018-12-04 MED ORDER — PROPOFOL 500 MG/50ML IV EMUL
INTRAVENOUS | Status: DC | PRN
  Administered 2018-12-04: 100 ug/kg/min via INTRAVENOUS

## 2018-12-04 MED ORDER — PHENYLEPHRINE 40 MCG/ML (10ML) SYRINGE FOR IV PUSH (FOR BLOOD PRESSURE SUPPORT)
PREFILLED_SYRINGE | INTRAVENOUS | Status: DC | PRN
  Administered 2018-12-04 (×3): 80 ug via INTRAVENOUS

## 2018-12-04 MED ORDER — EPHEDRINE SULFATE-NACL 50-0.9 MG/10ML-% IV SOSY
PREFILLED_SYRINGE | INTRAVENOUS | Status: DC | PRN
  Administered 2018-12-04: 10 mg via INTRAVENOUS

## 2018-12-04 MED ORDER — LISINOPRIL-HYDROCHLOROTHIAZIDE 20-25 MG PO TABS: 1.0000 | ORAL_TABLET | Freq: Every day | ORAL | 0 refills | Status: DC

## 2018-12-04 SURGICAL SUPPLY — 24 items

## 2018-12-04 NOTE — H&P (Signed)
Referring Provider: [name], PA Primary Care Physician:  [name], PA   Reason for Consultation:  Nausea and abdominal pain   IMPRESSION:  Abdominal pain Abnormal upper GI series    - mucosal edema along the greater curvature of the stomach Echogenic liver on ultrasound Cholelithiasis BMI 60 - under evaluation for bariatric surgery No prior colon cancer screening Lactose intolerance  UGI suggests peptic ulcer disease as the cause of her abdominal pain. Differential also includes: reflux, esophagitis, H pylori, biliary colic, functional abdominal pain.    PLAN: EGD with gastric biopsies Screening colonoscopy  I consented the patient at the bedside today discussing the risks, benefits, and alternatives to endoscopic evaluation. In particular, we discussed the risks that include, but are not limited to, reaction to medication, cardiopulmonary compromise, bleeding requiring blood transfusion, aspiration resulting in pneumonia, perforation requiring surgery, and even death. We reviewed the risk of missed lesion including polyps or even cancer. The patient acknowledges these risks and asks that we proceed.   HPI: [name] is a 53 y.o. female seen in consultation for abdominal pain. The history is obtained from the patient, records from [facility] surgery, and review of her electronic health record. Her aunt accompanies her to this appointment.   Seen in the ED in March 2019 for epigastric abdominal pain. 8/10. Cramping, aching. Radiates to the chest. Associated constipation, diarrhea, flatus, and nausea. Pain worsened by belching and flatus. She reports some brash.  Antacids and acetaminophen provided no significant relief. GI cocktail and Zofran provided some symptoms relief. She was discharged on PPI and Zofran.  Symptoms improved and were not daily. But, have recently returned and are escalating in frequency and intensity. She has been off the PPI  for several months. No other associated symptoms. No identified exacerbating or relieving features.   Abdominal ultrasound for abdominal pain 12/06/2017 while she was in the ED: Showed multiple gallstones measuring up to 1.1 cm, no sonographic Murphy, and normal bile ducts.  The liver is echogenic.   UGIS 06/30/18 obtained preoperatively as part of her bariatric surgery evaluation: Normal esophagus.  Prominent mucosal pattern along the distal greater curvature of the stomach multiple small parallel linear invaginations suggesting gastritis or other mucosal lesion.  Normal duodenum.   She reports a history of lactose intolerance and has been following a lactulose-free diet.   No prior endoscopic evaluation.  Aunt had similar symptoms that resolved after cholecystectomy.       Past Medical History:  Diagnosis Date  . Asthma   . Bipolar 1 disorder ([place])   . History of chicken pox   . Hypertension   . Sleep apnea   . Vitamin D deficiency          Past Surgical History:  Procedure Laterality Date  . BREAST REDUCTION SURGERY  1986  . Heart defect surgery    . TONSILLECTOMY             Prior to Admission medications   Medication Sig Start Date End Date Taking? Authorizing Provider  ARIPiprazole (ABILIFY) 5 MG tablet Take 5 mg by mouth at bedtime 03/21/17   [provider]  doxepin (SINEQUAN) 25 MG capsule Take 50 mg by mouth at bedtime 03/21/17   [provider]  lisinopril-hydrochlorothiazide (PRINZIDE,ZESTORETIC) 20-25 MG tablet Take 1 tablet by mouth daily. 06/16/18   [name], PA  metFORMIN (GLUCOPHAGE) 500 MG tablet TAKE 1 TABLET BY MOUTH EVERY DAY WITH BREAKFAST 06/19/18   [name], PA  Current Outpatient Medications  Medication Sig Dispense Refill  . ARIPiprazole (ABILIFY) 5 MG tablet Take 5 mg by mouth at bedtime  1  . doxepin (SINEQUAN) 25 MG capsule Take 50 mg by mouth at bedtime  1  .  lisinopril-hydrochlorothiazide (PRINZIDE,ZESTORETIC) 20-25 MG tablet Take 1 tablet by mouth daily. 90 tablet 1  . metFORMIN (GLUCOPHAGE) 500 MG tablet TAKE 1 TABLET BY MOUTH EVERY DAY WITH BREAKFAST 30 tablet 0   No current facility-administered medications for this visit.        Allergies as of 07/25/2018  . (No Known Allergies)         Family History  Problem Relation Age of Onset  . Diabetes Mother   . Hypertension Mother   . Stroke Father   . Diabetes Maternal Grandmother   . CVA Maternal Grandmother   . Diabetes Maternal Grandfather   . Colon cancer Paternal Grandmother   . Diabetes Paternal Grandfather   . Breast cancer Maternal Aunt   . Asthma Other   . Cancer Other   . COPD Other     Social History        Socioeconomic History  . Marital status: Single    Spouse name: Not on file  . Number of children: Not on file  . Years of education: Not on file  . Highest education level: Not on file  Occupational History  . Not on file  Social Needs  . Financial resource strain: Not on file  . Food insecurity:    Worry: Not on file    Inability: Not on file  . Transportation needs:    Medical: Not on file    Non-medical: Not on file  Tobacco Use  . Smoking status: Never Smoker  . Smokeless tobacco: Never Used  Substance and Sexual Activity  . Alcohol use: Yes    Alcohol/week: 1.0 - 2.0 standard drinks    Types: 1 - 2 Glasses of wine per week  . Drug use: No  . Sexual activity: Never  Lifestyle  . Physical activity:    Days per week: Not on file    Minutes per session: Not on file  . Stress: Not on file  Relationships  . Social connections:    Talks on phone: Not on file    Gets together: Not on file    Attends religious service: Not on file    Active member of club or organization: Not on file    Attends meetings of clubs or organizations: Not on file    Relationship status: Not on file  . Intimate  partner violence:    Fear of current or ex partner: Not on file    Emotionally abused: Not on file    Physically abused: Not on file    Forced sexual activity: Not on file  Other Topics Concern  . Not on file  Social History Narrative   Lives in [place], was living in [place], [place] for 20 years   No children, not married   Single   Family is nearby, mom and sister nearby   Currently on disability: 4 years ago she had an episode of psychosis (states that she didn't sleep for ~6-8 days prior to) and was in and out of mental institution for 6 months, lost her job and had to move back to [place] to be near family    Review of Systems: 12 system ROS is negative except as noted above.   Physical Exam: General:   Alert,  well-nourished, pleasant and cooperative in NAD Head:  Normocephalic and atraumatic. Eyes:  Sclera clear, no icterus.   Conjunctiva pink. Mouth:  No deformity or lesions.   Neck:  Supple; no thyromegaly. Lungs:  Clear throughout to auscultation.   No wheezes.  Heart:  Regular rate and rhythm; no murmurs Abdomen:  Soft, mild epigastric pain with palpation, normal bowel sounds. No rebound or guarding. No hepatosplenomegaly.  Rectal:  Deferred  Msk:  Symmetrical without gross deformities. Extremities:  No gross deformities or edema. Neurologic:  Alert and  oriented x4;  grossly nonfocal Skin:  No rash or bruise. Psych:  Alert and cooperative. Normal mood and affect.

## 2018-12-04 NOTE — Discharge Instructions (Signed)
YOU HAD AN ENDOSCOPIC PROCEDURE TODAY: Refer to the procedure report and other information in the discharge instructions given to you for any specific questions about what was found during the examination. If this information does not answer your questions, please call [facility] office at [phone] to clarify.  ° °YOU SHOULD EXPECT: Some feelings of bloating in the abdomen. Passage of more gas than usual. Walking can help get rid of the air that was put into your GI tract during the procedure and reduce the bloating. If you had a lower endoscopy (such as a colonoscopy or flexible sigmoidoscopy) you may notice spotting of blood in your stool or on the toilet paper. Some abdominal soreness may be present for a day or two, also. ° °DIET: Your first meal following the procedure should be a light meal and then it is ok to progress to your normal diet. A half-sandwich or bowl of soup is an example of a good first meal. Heavy or fried foods are harder to digest and may make you feel nauseous or bloated. Drink plenty of fluids but you should avoid alcoholic beverages for 24 hours. If you had a esophageal dilation, please see attached instructions for diet.   ° °ACTIVITY: Your care partner should take you home directly after the procedure. You should plan to take it easy, moving slowly for the rest of the day. You can resume normal activity the day after the procedure however YOU SHOULD NOT DRIVE, use power tools, machinery or perform tasks that involve climbing or major physical exertion for 24 hours (because of the sedation medicines used during the test).  ° °SYMPTOMS TO REPORT IMMEDIATELY: °A gastroenterologist can be reached at any hour. Please call [phone]  for any of the following symptoms:  °Following lower endoscopy (colonoscopy, flexible sigmoidoscopy) °Excessive amounts of blood in the stool  °Significant tenderness, worsening of abdominal pains  °Swelling of the abdomen that is new, acute  °Fever of 100° or  higher  °Following upper endoscopy (EGD, EUS, ERCP, esophageal dilation) °Vomiting of blood or coffee ground material  °New, significant abdominal pain  °New, significant chest pain or pain under the shoulder blades  °Painful or persistently difficult swallowing  °New shortness of breath  °Black, tarry-looking or red, bloody stools ° °FOLLOW UP:  °If any biopsies were taken you will be contacted by phone or by letter within the next 1-3 weeks. Call [phone]  if you have not heard about the biopsies in 3 weeks.  °Please also call with any specific questions about appointments or follow up tests. ° °

## 2018-12-04 NOTE — Op Note (Addendum)
[facility] Patient Name: [name] Procedure Date: 12/04/2018 MRN: [id number] Attending MD: Thornton Park MD, MD Date of Birth: 17-Mar-1966 CSN: [id number] Age: 53 Admit Type: Outpatient Procedure:                Upper GI endoscopy Indications:              Upper abdominal pain, Abnormal UGI series (mucosal                            edema along the greater curvature of the stomach) Providers:                Thornton Park MD, MD, Jeanella Cara, RN,                            Janie Billups, Technician, Courtney Heys. Armistead, CRNA Referring MD:              Medicines:                See the Anesthesia note for documentation of the                            administered medications Complications:            No immediate complications. Estimated blood loss:                            Minimal. Estimated Blood Loss:     Estimated blood loss was minimal. Procedure:                Pre-Anesthesia Assessment:                           - Prior to the procedure, a History and Physical                            was performed, and patient medications and                            allergies were reviewed. The patient's tolerance of                            previous anesthesia was also reviewed. The risks                            and benefits of the procedure and the sedation                            options and risks were discussed with the patient.                            All questions were answered, and informed consent                            was obtained. Prior Anticoagulants: The patient has  taken no previous anticoagulant or antiplatelet                            agents. ASA Grade Assessment: III - A patient with                            severe systemic disease. After reviewing the risks                            and benefits, the patient was deemed in                            satisfactory condition to undergo the  procedure.                           After obtaining informed consent, the endoscope was                            passed under direct vision. Throughout the                            procedure, the patient's blood pressure, pulse, and                            oxygen saturations were monitored continuously. The                            [id number]-H190 ([id number] Olympus gastroscope was                            introduced through the mouth, and advanced to the                            third part of duodenum. The upper GI endoscopy was                            accomplished without difficulty. The patient                            tolerated the procedure well. Scope In: Scope Out: Findings:      The esophagus was normal.      Localized mild inflammation was found in the gastric body. Biopsies were       taken with a cold forceps for histology.      The examined duodenum was normal.      The exam was otherwise without abnormality. Impression:               - Normal esophagus.                           - Gastritis. Biopsied.                           - Normal examined duodenum.                           -  The examination was otherwise normal. Moderate Sedation:      Not Applicable - Patient had care per Anesthesia. Recommendation:           - Patient has a contact number available for                            emergencies. The signs and symptoms of potential                            delayed complications were discussed with the                            patient. Return to normal activities tomorrow.                            Written discharge instructions were provided to the                            patient.                           - Resume regular diet today.                           - Continue present medications.                           - Await pathology results.                           - No repeat upper endoscopy planned at this time.                           -  Avoid all NSAIDS.                           - Proceed with colonoscopy as previously planned. Procedure Code(s):        --- Professional ---                           [phone], Esophagogastroduodenoscopy, flexible,                            transoral; with biopsy, single or multiple Diagnosis Code(s):        --- Professional ---                           K29.70, Gastritis, unspecified, without bleeding                           R10.10, Upper abdominal pain, unspecified                           R93.3, Abnormal findings on diagnostic imaging of                            other  parts of digestive tract CPT copyright 2018 American Medical Association. All rights reserved. The codes documented in this report are preliminary and upon coder review may  be revised to meet current compliance requirements. Thornton Park MD, MD 12/04/2018 8:58:51 AM This report has been signed electronically. Number of Addenda: 0

## 2018-12-04 NOTE — Anesthesia Postprocedure Evaluation (Signed)
Anesthesia Post Note  Patient: [name]  Procedure(s) Performed: COLONOSCOPY WITH PROPOFOL (N/A ) ESOPHAGOGASTRODUODENOSCOPY (EGD) WITH PROPOFOL (N/A ) BIOPSY     Patient location during evaluation: Endoscopy Anesthesia Type: MAC Level of consciousness: awake and alert Pain management: pain level controlled Vital Signs Assessment: post-procedure vital signs reviewed and stable Respiratory status: spontaneous breathing, nonlabored ventilation, respiratory function stable and patient connected to nasal cannula oxygen Cardiovascular status: blood pressure returned to baseline and stable Postop Assessment: no apparent nausea or vomiting Anesthetic complications: no    Last Vitals:  Vitals:   12/04/18 0727 12/04/18 0858  BP: (!) 119/52 (!) 74/44  Pulse: 100 93  Resp: 20 18  Temp: 37 C 36.5 C  SpO2: 97% 100%    Last Pain:  Vitals:   12/04/18 0858  TempSrc: Oral  PainSc: 0-No pain                 Barnet Glasgow

## 2018-12-04 NOTE — Transfer of Care (Signed)
Immediate Anesthesia Transfer of Care Note  Patient: [name]  Procedure(s) Performed: COLONOSCOPY WITH PROPOFOL (N/A ) ESOPHAGOGASTRODUODENOSCOPY (EGD) WITH PROPOFOL (N/A ) BIOPSY  Patient Location: PACU and Endoscopy Unit  Anesthesia Type:MAC  Level of Consciousness: awake, alert , oriented and patient cooperative  Airway & Oxygen Therapy: Patient Spontanous Breathing and Patient connected to nasal cannula oxygen  Post-op Assessment: Report given to RN, Post -op Vital signs reviewed and stable and Patient moving all extremities  Post vital signs: Reviewed and stable  Last Vitals:  Vitals Value Taken Time  BP    Temp    Pulse    Resp    SpO2      Last Pain:  Vitals:   12/04/18 0727  TempSrc: Oral  PainSc: 0-No pain         Complications: No apparent anesthesia complications

## 2018-12-04 NOTE — Op Note (Addendum)
[facility] Patient Name: [name] Procedure Date: 12/04/2018 MRN: [id number] Attending MD: Thornton Park MD, MD Date of Birth: 1966/07/28 CSN: [id number] Age: 53 Admit Type: Outpatient Procedure:                Colonoscopy Indications:              Screening for colorectal malignant neoplasm, This                            is the patient's first colonoscopy. No known family                            history of colon cancer or polyps. Providers:                Thornton Park MD, MD, Jeanella Cara, RN,                            Janie Billups, Technician, Courtney Heys. Armistead, CRNA Referring MD:              Medicines:                See the Anesthesia note for documentation of the                            administered medications Complications:            No immediate complications. Estimated blood loss:                            Minimal. Estimated Blood Loss:     Estimated blood loss was minimal. Procedure:                Pre-Anesthesia Assessment:                           - Prior to the procedure, a History and Physical                            was performed, and patient medications and                            allergies were reviewed. The patient's tolerance of                            previous anesthesia was also reviewed. The risks                            and benefits of the procedure and the sedation                            options and risks were discussed with the patient.                            All questions were answered, and informed consent  was obtained. Prior Anticoagulants: The patient has                            taken no previous anticoagulant or antiplatelet                            agents. ASA Grade Assessment: III - A patient with                            severe systemic disease. After reviewing the risks                            and benefits, the patient was deemed in                             satisfactory condition to undergo the procedure.                           After obtaining informed consent, the colonoscope                            was passed under direct vision. Throughout the                            procedure, the patient's blood pressure, pulse, and                            oxygen saturations were monitored continuously. The                            CF-HQ190L ([id number] Olympus colonoscope was                            introduced through the anus and advanced to the the                            terminal ileum, with identification of the                            appendiceal orifice and IC valve. The colonoscopy                            was performed with moderate difficulty due to a                            redundant colon, significant looping and a tortuous                            colon. Successful completion of the procedure was                            aided by applying abdominal pressure. The patient  tolerated the procedure well. The quality of the                            bowel preparation was good. The terminal ileum,                            ileocecal valve, appendiceal orifice, and rectum                            were photographed. Scope In: 8:28:58 AM Scope Out: 8:49:23 AM Scope Withdrawal Time: 0 hours 16 minutes 39 seconds  Total Procedure Duration: 0 hours 20 minutes 25 seconds  Findings:      The perianal and digital rectal examinations were normal.      Multiple small-mouthed diverticula were found in the sigmoid colon and       descending colon.      Three sessile polyps were found in the rectum, descending colon and       ascending colon. The polyps were 2 to 3 mm in size. These polyps were       removed with a cold biopsy forceps. Resection and retrieval were       complete. Estimated blood loss was minimal.      The exam was otherwise without abnormality on direct and retroflexion        views. Impression:               - Diverticulosis in the sigmoid colon and in the                            descending colon.                           - Three 2 to 3 mm polyps in the rectum, in the                            descending colon and in the ascending colon,                            removed with a cold biopsy forceps. Resected and                            retrieved.                           - The examination was otherwise normal on direct                            and retroflexion views. Moderate Sedation:      Not Applicable - Patient had care per Anesthesia. Recommendation:           - Patient has a contact number available for                            emergencies. The signs and symptoms of potential  delayed complications were discussed with the                            patient. Return to normal activities tomorrow.                            Written discharge instructions were provided to the                            patient.                           - Resume regular diet today.                           - Await pathology results.                           - Repeat colonoscopy date to be determined after                            pending pathology results are reviewed for                            surveillance based on pathology results. Procedure Code(s):        --- Professional ---                           [phone], Colonoscopy, flexible; with biopsy, single                            or multiple Diagnosis Code(s):        --- Professional ---                           Z12.11, Encounter for screening for malignant                            neoplasm of colon                           K62.1, Rectal polyp                           D12.4, Benign neoplasm of descending colon                           D12.2, Benign neoplasm of ascending colon                           K57.30, Diverticulosis of large intestine without                             perforation or abscess without bleeding CPT copyright 2018 American Medical Association. All rights reserved. The codes documented in this report are preliminary and upon coder review may  be revised to meet current compliance requirements. Thornton Park MD, MD  12/04/2018 9:02:38 AM This report has been signed electronically. Number of Addenda: 0

## 2018-12-06 ENCOUNTER — Encounter: Payer: Self-pay | Admitting: Gastroenterology

## 2018-12-06 ENCOUNTER — Other Ambulatory Visit: Payer: Self-pay

## 2018-12-06 MED ORDER — CLARITHROMYCIN 500 MG PO TABS: 500.0000 mg | ORAL_TABLET | Freq: Two times a day (BID) | ORAL | 0 refills | Status: DC

## 2018-12-06 MED ORDER — AMOXICILLIN 500 MG PO CAPS: 1000.0000 mg | ORAL_CAPSULE | Freq: Two times a day (BID) | ORAL | 0 refills | Status: DC

## 2018-12-07 ENCOUNTER — Encounter: Payer: PPO | Attending: Surgery | Admitting: Skilled Nursing Facility1

## 2018-12-07 ENCOUNTER — Encounter (HOSPITAL_COMMUNITY): Payer: Self-pay | Admitting: Gastroenterology

## 2018-12-07 DIAGNOSIS — Z6841 Body Mass Index (BMI) 40.0 and over, adult: Secondary | ICD-10-CM | POA: Insufficient documentation

## 2018-12-07 DIAGNOSIS — Z713 Dietary counseling and surveillance: Secondary | ICD-10-CM | POA: Insufficient documentation

## 2018-12-07 DIAGNOSIS — E669 Obesity, unspecified: Secondary | ICD-10-CM

## 2018-12-07 NOTE — Progress Notes (Signed)
Bariatric Supervised Weight Loss Visit Appt Start Time: 10:30am  End Time: 11:00am  Planned Surgery: Sleeve Gastrectomy   5th out of 6 SWL Appointments   NUTRITION ASSESSMENT  Anthropometrics  Start weight at [facility]: 313.2 lbs Today's weight: 302 Weight change: -2 BMI: 59.08 kg/m2    Psychosocial/Lifestyle Pt states she lives alone other than with her new 21 month old puppy.    24-Hr Dietary Recall First Meal: protein shake or low sugar oatmeal or cereal Snack: none Second Meal: [place] sandwich with veggie chips or PBJ or salad  Snack: 12 chips or popcorn Third Meal: chicken or fish + salad + mashed potatoes or rice Snack: none Beverages: water: 64 ounces  Food & Nutrition Related Hx Dietary Hx: Pt states she is eating 3 meals per day. Pt states sometimes her puppy wakes her up and she has trouble going back to sleep. Estimated Daily Fluid Intake: 50-64 oz GI / Other Notable Symptoms: acid reflux   Pt states she is taking an antibiotic due to h.pylori. Pt states her colonoscopy found 3 precancerous polyps which has scared her. Pt states she finds herself craving carbohydrates. Pt states her new medications are working for her mentally her bought's of sadness are still happening but less often.   Physical Activity  Current average weekly physical activity: treadmill 20-30 minutes 2 days a week  Estimated Energy Needs Calories: 1600 Carbohydrate: 180g Protein: 120g Fat: 44g  Progress Towards Pre-Op Goals Previously Chosen . Pt states she is very proud of herself for giving up soda.  . Pt records her food intake on an app.  . Pt states she has made progress towards making healthy food choices: paying close attention to serving . Pt is now eating breakfast to achieve 3 meals a day. (Previously was only eating 1 meal/day) . Practice not drinking 15 minutes before, during, and 30 minutes after each meal/snack  . Preparing meals before the day . Working on balancing  meals . Recognizing when letting stress get in the way of making healthy lifestyle changes    NUTRITION DIAGNOSIS  Overweight/obesity ([place]-3.3) related to past poor dietary habits and physical inactivity as evidenced by patient w/ planned sleeve gastrectomy surgery following dietary guidelines for continued weight loss.    NUTRITION INTERVENTION  Nutrition counseling (C-1) and education (E-2) to facilitate bariatric surgery goals.  New Pre-Op Goals to Work On -2 days a week get on your treadmill for 30 minutes  -Continue to drink your water  -Work on not feeling guilty with your foods   Learning Style & Readiness for Change Teaching method utilized: Visual & Auditory  Demonstrated degree of understanding via: Teach Back  Barriers to learning/adherence to lifestyle change: none identified   MONITORING & EVALUATION Dietary intake, weekly physical activity, body weight, and pre-op goals.   Next Steps  Notes for next SWL Visit: Assess progress made on previously set pre-op goals and prepare to set 1-2 new goals to work towards.

## 2018-12-07 NOTE — Patient Instructions (Addendum)
-  2 days a week get on your treadmill for 30 minutes   -Continue to drink your water   -Work on not feeling guilty with your foods

## 2019-01-04 ENCOUNTER — Ambulatory Visit: Payer: PPO | Admitting: Psychiatry

## 2019-01-05 ENCOUNTER — Other Ambulatory Visit: Payer: Self-pay

## 2019-01-05 ENCOUNTER — Ambulatory Visit (INDEPENDENT_AMBULATORY_CARE_PROVIDER_SITE_OTHER): Payer: PPO | Admitting: Gastroenterology

## 2019-01-05 ENCOUNTER — Encounter: Payer: Self-pay | Admitting: Gastroenterology

## 2019-01-05 DIAGNOSIS — A048 Other specified bacterial intestinal infections: Secondary | ICD-10-CM

## 2019-01-05 MED ORDER — PANTOPRAZOLE SODIUM 40 MG PO TBEC: 40.0000 mg | DELAYED_RELEASE_TABLET | Freq: Every day | ORAL | 0 refills | Status: DC

## 2019-01-05 NOTE — Patient Instructions (Addendum)
I am so glad that you are feeling better.  Please continue to take your pantoprazole daily for 4 more weeks.  We have sent a 1 month script to your pharmacy.  Your provider has requested that you go to the basement level for lab work. Press "B" on the elevator. The lab is located at the first door on the left as you exit the elevator. H pylori testing (a stool antigen test) after you have been off the pantoprazole for 2 weeks. -- lab hours are Monday thru Thursday, 8 am-4 pm.  Return with any questions or concerns in the meantime.  Thank you for your patience with me and our technology today! Please stay home, safe, and healthy.

## 2019-01-05 NOTE — Progress Notes (Signed)
TELEHEALTH VISIT  Referring Provider: [name], PA Primary Care Physician:  [name], PA   Tele-visit due to COVID-19 pandemic Patient requested visit virtually, consented to the virtual encounter via WebEx Contact made at: 10:31 01/05/19 Patient verified by name and date of birth Location of patient: Home Location provider: My medical office Names of persons participating: Me, patient, Quintin Alto CMA Time spent on telehealth visit: 15 minutes  Chief complaint:  Abdominal pain  IMPRESSION:  H pylori gastritis History of colon polyps    - 3 tubular adenomas on colonoscopy 12/04/18     - surveillance colonoscopy recommended 2023 Abnormal upper GI series 06/30/18 with     - mucosal edema along the greater curvature of the stomach due to H pylori gastritis    - evaluated on EGD 12/04/18: gastritis, no mass or ulcer identified Echogenic liver on ultrasound Cholelithiasis BMI 60 - under evaluation for bariatric surgery Lactose intolerance  GI symptoms have resolved after completing H pylori therapy. Will plan testing for irradication after she completes a total of 8 weeks of daily pantoprazole.   PLAN: Complete an additional 4 weeks of pantoprazole Will plan H pylori stool antigen after a 2 week washout of pantoprazole Surveillance colonoscopy in 3 years Return with any questions or concerns in the meantime   HPI: [name] is a 53 y.o. female diagnosed with H pylori gastritis on EGD 12/04/18 during the evaluation of abdominal pain. Colonoscopy at that time revealed 3 small tubular adenomas. Completed 10 days of clarithromycin 500 mg BID x 10 days and amoxicillin 500 mg BID x 10 last month. Her abdominal pain, diarrhea, and nausea have completely resolved with the antibiotics. She continues on pantoprazole 40 mg daily. She has no ongoing GI complaints or concerns at this time. No new concerns today.     Past Medical History:  Diagnosis Date  . Anemia   .  Anxiety   . Asthma   . Bipolar 1 disorder ([place])   . Depression   . Diabetes mellitus without complication ([place])   . GERD (gastroesophageal reflux disease)   . History of chicken pox   . Hypertension   . Sleep apnea    does not use cpap  . Vitamin D deficiency     Past Surgical History:  Procedure Laterality Date  . BIOPSY  12/04/2018   Procedure: BIOPSY;  Surgeon: Thornton Park, MD;  Location: [facility];  Service: Gastroenterology;;  EGD and Colon  . BREAST REDUCTION SURGERY  1986  . COLONOSCOPY WITH PROPOFOL N/A 12/04/2018   Procedure: COLONOSCOPY WITH PROPOFOL;  Surgeon: Thornton Park, MD;  Location: [facility];  Service: Gastroenterology;  Laterality: N/A;  . ESOPHAGOGASTRODUODENOSCOPY (EGD) WITH PROPOFOL N/A 12/04/2018   Procedure: ESOPHAGOGASTRODUODENOSCOPY (EGD) WITH PROPOFOL;  Surgeon: Thornton Park, MD;  Location: [facility];  Service: Gastroenterology;  Laterality: N/A;  . Heart defect surgery    . TONSILLECTOMY      Current Outpatient Medications  Medication Sig Dispense Refill  . ARIPiprazole (ABILIFY) 10 MG tablet Take 10 mg by mouth daily.     . Diclofenac Sodium (PENNSAID) 2 % SOLN Place 1 application onto the skin 2 (two) times daily. 112 g 2  . ferrous sulfate 325 (65 FE) MG tablet Take 325 mg by mouth daily with breakfast.    . lamoTRIgine (LAMICTAL) 25 MG tablet Take 25 mg by mouth daily.     Marland Kitchen lisinopril-hydrochlorothiazide (PRINZIDE,ZESTORETIC) 20-25 MG tablet Take 1 tablet by mouth daily. 90 tablet 0  .  metFORMIN (GLUCOPHAGE) 500 MG tablet TAKE 1 TABLET BY MOUTH EVERY DAY WITH BREAKFAST 90 tablet 0  . pantoprazole (PROTONIX) 40 MG tablet Take 1 tablet (40 mg total) by mouth every morning. (Patient taking differently: Take 40 mg by mouth daily. ) 30 tablet 3  . traZODone (DESYREL) 50 MG tablet Take 50 mg by mouth at bedtime.     No current facility-administered medications for this visit.     Allergies as of 01/05/2019  . (No Known Allergies)     Family History  Problem Relation Age of Onset  . Diabetes Mother   . Hypertension Mother   . Stroke Father   . Diabetes Maternal Grandmother   . CVA Maternal Grandmother   . Diabetes Maternal Grandfather   . Colon cancer Paternal Grandmother   . Diabetes Paternal Grandfather   . Breast cancer Maternal Aunt   . Asthma Other   . Cancer Other   . COPD Other   . Esophageal cancer Neg Hx   . Rectal cancer Neg Hx   . Stomach cancer Neg Hx   . Liver disease Neg Hx     Social History   Socioeconomic History  . Marital status: Single    Spouse name: Not on file  . Number of children: Not on file  . Years of education: Not on file  . Highest education level: Not on file  Occupational History  . Not on file  Social Needs  . Financial resource strain: Not on file  . Food insecurity:    Worry: Not on file    Inability: Not on file  . Transportation needs:    Medical: Not on file    Non-medical: Not on file  Tobacco Use  . Smoking status: Never Smoker  . Smokeless tobacco: Never Used  Substance and Sexual Activity  . Alcohol use: Yes    Alcohol/week: 1.0 - 2.0 standard drinks    Types: 1 - 2 Glasses of wine per week  . Drug use: No  . Sexual activity: Never  Lifestyle  . Physical activity:    Days per week: Not on file    Minutes per session: Not on file  . Stress: Not on file  Relationships  . Social connections:    Talks on phone: Not on file    Gets together: Not on file    Attends religious service: Not on file    Active member of club or organization: Not on file    Attends meetings of clubs or organizations: Not on file    Relationship status: Not on file  . Intimate partner violence:    Fear of current or ex partner: Not on file    Emotionally abused: Not on file    Physically abused: Not on file    Forced sexual activity: Not on file  Other Topics Concern  . Not on file  Social History Narrative   Lives in [place], was living in [place], [place] for 20  years   No children, not married   Single   Family is nearby, mom and sister nearby   Currently on disability: 4 years ago she had an episode of psychosis (states that she didn't sleep for ~6-8 days prior to) and was in and out of mental institution for 6 months, lost her job and had to move back to [place] to be near family    Review of Systems: ALL ROS discussed and all others negative except listed in HPI.  Physical Exam:  General: in no acute distress Neuro: Alert and appropriate Psych: Normal affect and normal insight   Domingo Fuson L. Tarri Glenn, MD, MPH [facility] 01/05/2019, 10:25 AM

## 2019-01-08 ENCOUNTER — Ambulatory Visit: Payer: Self-pay | Admitting: Skilled Nursing Facility1

## 2019-01-19 ENCOUNTER — Ambulatory Visit: Payer: Self-pay | Admitting: Gastroenterology

## 2019-01-26 ENCOUNTER — Telehealth: Payer: Self-pay | Admitting: Physician Assistant

## 2019-01-26 NOTE — Telephone Encounter (Signed)
Error

## 2019-01-29 ENCOUNTER — Ambulatory Visit (INDEPENDENT_AMBULATORY_CARE_PROVIDER_SITE_OTHER): Payer: PPO | Admitting: Physician Assistant

## 2019-01-29 ENCOUNTER — Encounter: Payer: Self-pay | Admitting: Physician Assistant

## 2019-01-29 DIAGNOSIS — E559 Vitamin D deficiency, unspecified: Secondary | ICD-10-CM

## 2019-01-29 DIAGNOSIS — I10 Essential (primary) hypertension: Secondary | ICD-10-CM

## 2019-01-29 DIAGNOSIS — E8881 Metabolic syndrome: Secondary | ICD-10-CM

## 2019-01-29 DIAGNOSIS — E785 Hyperlipidemia, unspecified: Secondary | ICD-10-CM | POA: Diagnosis not present

## 2019-01-29 NOTE — Addendum Note (Signed)
Addended by: Erlene Quan on: 01/29/2019 01:51 PM   Modules accepted: Orders

## 2019-01-29 NOTE — Progress Notes (Signed)
Virtual Visit via Video   I connected with [name] on 01/29/19 at 10:00 AM EDT by a video enabled telemedicine application and verified that I am speaking with the correct person using two identifiers. Location patient: Home Location provider: [facility], Office Persons participating in the virtual visit: [name], Dana Coke, PA-C  I discussed the limitations of evaluation and management by telemedicine and the availability of in person appointments. The patient expressed understanding and agreed to proceed.  Subjective:   HPI:  Hypertension Pt following up today on blood pressure. She is checking her blood pressure at home. She is currently on Lisinopril-HCTZ 20-25 mg. At home blood pressure readings are: 145/72. Patient denies chest pain, SOB, blurred vision, dizziness, unusual headaches, lower leg swelling. Patient is compliant with medication. Denies excessive caffeine intake, stimulant usage, excessive alcohol intake, or increase in salt consumption.  Obesity  She has decided to put her bariatric surgery on hold. She was told that it may make her gastritis/GERD worse, so she has significant reservations regarding this. Was recently treated for H Pylori. She is currently 297 lb. She is taking Metformin 500 mg daily.  Insulin Resistance She has been on Metformin 500 mg daily x 6 months. She is tolerating this medication well. She is interested in other medications for weight loss.  Vit D Deficiency She is due for Vit D recheck today.   ROS: See pertinent positives and negatives per HPI.  Patient Active Problem List   Diagnosis Date Noted  . Polyp of colon   . Abdominal pain   . Knee pain, chronic 04/10/2018  . Insulin resistance 04/10/2018  . Vitamin D deficiency   . Sleep apnea 03/31/2017  . Morbid obesity ([place]) 03/31/2017  . Hypertension 03/31/2017  . Bipolar 1 disorder ([place]) 03/31/2017    Social History   Tobacco Use  . Smoking status: Never Smoker   . Smokeless tobacco: Never Used  Substance Use Topics  . Alcohol use: Yes    Alcohol/week: 1.0 - 2.0 standard drinks    Types: 1 - 2 Glasses of wine per week    Current Outpatient Medications:  .  ARIPiprazole (ABILIFY) 10 MG tablet, Take 10 mg by mouth daily. , Disp: , Rfl:  .  Diclofenac Sodium (PENNSAID) 2 % SOLN, Place 1 application onto the skin 2 (two) times daily., Disp: 112 g, Rfl: 2 .  ferrous sulfate 325 (65 FE) MG tablet, Take 325 mg by mouth daily with breakfast., Disp: , Rfl:  .  lamoTRIgine (LAMICTAL) 25 MG tablet, Take 25 mg by mouth daily. , Disp: , Rfl:  .  lisinopril-hydrochlorothiazide (PRINZIDE,ZESTORETIC) 20-25 MG tablet, Take 1 tablet by mouth daily., Disp: 90 tablet, Rfl: 0 .  metFORMIN (GLUCOPHAGE) 500 MG tablet, TAKE 1 TABLET BY MOUTH EVERY DAY WITH BREAKFAST, Disp: 90 tablet, Rfl: 0 .  pantoprazole (PROTONIX) 40 MG tablet, Take 1 tablet (40 mg total) by mouth daily., Disp: 30 tablet, Rfl: 0 .  traZODone (DESYREL) 50 MG tablet, Take 50 mg by mouth at bedtime., Disp: , Rfl:   No Known Allergies  Objective:   VITALS: Per patient if applicable, see vitals. GENERAL: Alert, appears well and in no acute distress. HEENT: Atraumatic, conjunctiva clear, no obvious abnormalities on inspection of external nose and ears. NECK: Normal movements of the head and neck. CARDIOPULMONARY: No increased WOB. Speaking in clear sentences. I:E ratio WNL.  MS: Moves all visible extremities without noticeable abnormality. PSYCH: Pleasant and cooperative, well-groomed. Speech  normal rate and rhythm. Affect is appropriate. Insight and judgement are appropriate. Attention is focused, linear, and appropriate.  NEURO: CN grossly intact. Oriented as arrived to appointment on time with no prompting. Moves both UE equally.  SKIN: No obvious lesions, wounds, erythema, or cyanosis noted on face or hands.  Assessment and Plan:   [name] was seen today for hypertension.  Diagnoses and all  orders for this visit:  Vitamin D deficiency Re-check today. -     VITAMIN D 25 Hydroxy (Vit-D Deficiency, Fractures)  Essential hypertension Stable. Will continue current medication. -     CBC with Differential/Platelet -     Comprehensive metabolic panel  Morbid obesity ([place]); Insulin resistance Counseling provided. We are going to consider Trulicity if possible. Encouraged her to follow-up with bariatric surgeon if and when she is comfortable doing so. -     CBC with Differential/Platelet -     Comprehensive metabolic panel -     Hemoglobin A1c -     Lipid panel  Hyperlipidemia, unspecified hyperlipidemia type Re-check today. -     Lipid panel   . Reviewed expectations re: course of current medical issues. . Discussed self-management of symptoms. . Outlined signs and symptoms indicating need for more acute intervention. . Patient verbalized understanding and all questions were answered. Marland Kitchen Health Maintenance issues including appropriate healthy diet, exercise, and smoking avoidance were discussed with patient. . See orders for this visit as documented in the electronic medical record.  I discussed the assessment and treatment plan with the patient. The patient was provided an opportunity to ask questions and all were answered. The patient agreed with the plan and demonstrated an understanding of the instructions.   The patient was advised to call back or seek an in-person evaluation if the symptoms worsen or if the condition fails to improve as anticipated.    [place], [place] 01/29/2019

## 2019-01-30 ENCOUNTER — Other Ambulatory Visit (INDEPENDENT_AMBULATORY_CARE_PROVIDER_SITE_OTHER): Payer: PPO

## 2019-01-30 ENCOUNTER — Other Ambulatory Visit: Payer: Self-pay

## 2019-01-30 DIAGNOSIS — I10 Essential (primary) hypertension: Secondary | ICD-10-CM | POA: Diagnosis not present

## 2019-01-30 DIAGNOSIS — E559 Vitamin D deficiency, unspecified: Secondary | ICD-10-CM | POA: Diagnosis not present

## 2019-01-30 DIAGNOSIS — E785 Hyperlipidemia, unspecified: Secondary | ICD-10-CM | POA: Diagnosis not present

## 2019-01-30 DIAGNOSIS — E8881 Metabolic syndrome: Secondary | ICD-10-CM | POA: Diagnosis not present

## 2019-01-30 LAB — CBC WITH DIFFERENTIAL/PLATELET
Basophils Absolute: 0 10*3/uL (ref 0.0–0.1)
Basophils Relative: 0.2 % (ref 0.0–3.0)
Eosinophils Absolute: 0.1 10*3/uL (ref 0.0–0.7)
Eosinophils Relative: 1 % (ref 0.0–5.0)
HCT: 30.3 % — ABNORMAL LOW (ref 36.0–46.0)
Hemoglobin: 9.6 g/dL — ABNORMAL LOW (ref 12.0–15.0)
Lymphocytes Relative: 17.7 % (ref 12.0–46.0)
Lymphs Abs: 1.2 10*3/uL (ref 0.7–4.0)
MCHC: 31.7 g/dL (ref 30.0–36.0)
MCV: 80 fl (ref 78.0–100.0)
Monocytes Absolute: 0.4 10*3/uL (ref 0.1–1.0)
Monocytes Relative: 6.2 % (ref 3.0–12.0)
Neutro Abs: 4.9 10*3/uL (ref 1.4–7.7)
Neutrophils Relative %: 74.9 % (ref 43.0–77.0)
Platelets: 431 10*3/uL — ABNORMAL HIGH (ref 150.0–400.0)
RBC: 3.78 Mil/uL — ABNORMAL LOW (ref 3.87–5.11)
RDW: 18 % — ABNORMAL HIGH (ref 11.5–15.5)
WBC: 6.6 10*3/uL (ref 4.0–10.5)

## 2019-01-30 LAB — COMPREHENSIVE METABOLIC PANEL
ALT: 11 U/L (ref 0–35)
AST: 10 U/L (ref 0–37)
Albumin: 3.6 g/dL (ref 3.5–5.2)
Alkaline Phosphatase: 78 U/L (ref 39–117)
BUN: 15 mg/dL (ref 6–23)
CO2: 28 mEq/L (ref 19–32)
Calcium: 8.9 mg/dL (ref 8.4–10.5)
Chloride: 98 mEq/L (ref 96–112)
Creatinine, Ser: 0.85 mg/dL (ref 0.40–1.20)
GFR: 85.29 mL/min (ref >60.00)
Glucose, Bld: 110 mg/dL — ABNORMAL HIGH (ref 70–99)
Potassium: 4 mEq/L (ref 3.5–5.1)
Sodium: 136 mEq/L (ref 135–145)
Total Bilirubin: 0.4 mg/dL (ref 0.2–1.2)
Total Protein: 7.4 g/dL (ref 6.0–8.3)

## 2019-01-30 LAB — LIPID PANEL
Cholesterol: 156 mg/dL (ref 0–200)
HDL: 44.2 mg/dL (ref >39.00)
LDL Cholesterol: 90 mg/dL (ref 0–99)
NonHDL: 112.21
Total CHOL/HDL Ratio: 4
Triglycerides: 110 mg/dL (ref 0.0–149.0)
VLDL: 22 mg/dL (ref 0.0–40.0)

## 2019-01-30 LAB — VITAMIN D 25 HYDROXY (VIT D DEFICIENCY, FRACTURES): VITD: 15.51 ng/mL — ABNORMAL LOW (ref 30.00–100.00)

## 2019-01-30 LAB — HEMOGLOBIN A1C: Hgb A1c MFr Bld: 6.4 % (ref 4.6–6.5)

## 2019-01-31 ENCOUNTER — Telehealth: Payer: Self-pay | Admitting: Physician Assistant

## 2019-01-31 ENCOUNTER — Other Ambulatory Visit: Payer: Self-pay | Admitting: Physician Assistant

## 2019-01-31 MED ORDER — VITAMIN D (ERGOCALCIFEROL) 1.25 MG (50000 UNIT) PO CAPS: 50000.0000 [IU] | ORAL_CAPSULE | ORAL | 0 refills | Status: DC

## 2019-01-31 NOTE — Telephone Encounter (Signed)
See note

## 2019-01-31 NOTE — Telephone Encounter (Signed)
Copied from [place] [id number]. Topic: General - Other >> Jan 31, 2019  1:20 PM Lennox Solders wrote: Reason for CRM: pt is calling and would like to pick up a sample of ozempic if none available call rx into [address]

## 2019-01-31 NOTE — Telephone Encounter (Signed)
Spoke to pt told her we do have sample of Ozempic she can come by the office and pick it up. Pt verbalized understanding.

## 2019-02-01 ENCOUNTER — Other Ambulatory Visit: Payer: Self-pay | Admitting: *Deleted

## 2019-02-01 MED ORDER — METFORMIN HCL 500 MG PO TABS: ORAL_TABLET | ORAL | 0 refills | Status: DC

## 2019-02-06 ENCOUNTER — Ambulatory Visit: Payer: PPO | Admitting: Physician Assistant

## 2019-02-20 ENCOUNTER — Encounter (HOSPITAL_COMMUNITY): Payer: Self-pay

## 2019-02-20 ENCOUNTER — Emergency Department (HOSPITAL_COMMUNITY): Payer: PPO

## 2019-02-20 ENCOUNTER — Emergency Department (HOSPITAL_COMMUNITY)
Admission: EM | Admit: 2019-02-20 | Discharge: 2019-02-20 | Disposition: A | Discharge status: Home / Self Care | Payer: PPO | Attending: Emergency Medicine | Admitting: Emergency Medicine

## 2019-02-20 ENCOUNTER — Other Ambulatory Visit: Payer: Self-pay

## 2019-02-20 DIAGNOSIS — D649 Anemia, unspecified: Secondary | ICD-10-CM | POA: Insufficient documentation

## 2019-02-20 DIAGNOSIS — K8050 Calculus of bile duct without cholangitis or cholecystitis without obstruction: Secondary | ICD-10-CM | POA: Insufficient documentation

## 2019-02-20 DIAGNOSIS — J45909 Unspecified asthma, uncomplicated: Secondary | ICD-10-CM | POA: Insufficient documentation

## 2019-02-20 DIAGNOSIS — I10 Essential (primary) hypertension: Secondary | ICD-10-CM | POA: Insufficient documentation

## 2019-02-20 DIAGNOSIS — Z79899 Other long term (current) drug therapy: Secondary | ICD-10-CM | POA: Insufficient documentation

## 2019-02-20 DIAGNOSIS — R1011 Right upper quadrant pain: Secondary | ICD-10-CM | POA: Diagnosis present

## 2019-02-20 DIAGNOSIS — E119 Type 2 diabetes mellitus without complications: Secondary | ICD-10-CM | POA: Diagnosis not present

## 2019-02-20 DIAGNOSIS — K8020 Calculus of gallbladder without cholecystitis without obstruction: Secondary | ICD-10-CM | POA: Diagnosis not present

## 2019-02-20 LAB — COMPREHENSIVE METABOLIC PANEL
ALT: 18 U/L (ref 0–44)
AST: 18 U/L (ref 15–41)
Albumin: 3.5 g/dL (ref 3.5–5.0)
Alkaline Phosphatase: 68 U/L (ref 38–126)
Anion gap: 15 (ref 5–15)
BUN: 13 mg/dL (ref 6–20)
CO2: 25 mmol/L (ref 22–32)
Calcium: 9.2 mg/dL (ref 8.9–10.3)
Chloride: 96 mmol/L — ABNORMAL LOW (ref 98–111)
Creatinine, Ser: 0.88 mg/dL (ref 0.44–1.00)
GFR calc Af Amer: >60 mL/min (ref >60)
GFR calc non Af Amer: >60 mL/min (ref >60)
Glucose, Bld: 144 mg/dL — ABNORMAL HIGH (ref 70–99)
Potassium: 3.8 mmol/L (ref 3.5–5.1)
Sodium: 136 mmol/L (ref 135–145)
Total Bilirubin: 0.6 mg/dL (ref 0.3–1.2)
Total Protein: 8.4 g/dL — ABNORMAL HIGH (ref 6.5–8.1)

## 2019-02-20 LAB — CBC WITH DIFFERENTIAL/PLATELET
Abs Immature Granulocytes: 0.07 10*3/uL (ref 0.00–0.07)
Basophils Absolute: 0 10*3/uL (ref 0.0–0.1)
Basophils Relative: 0 %
Eosinophils Absolute: 0 10*3/uL (ref 0.0–0.5)
Eosinophils Relative: 0 %
HCT: 31.9 % — ABNORMAL LOW (ref 36.0–46.0)
Hemoglobin: 9.7 g/dL — ABNORMAL LOW (ref 12.0–15.0)
Immature Granulocytes: 1 %
Lymphocytes Relative: 9 %
Lymphs Abs: 1 10*3/uL (ref 0.7–4.0)
MCH: 24.9 pg — ABNORMAL LOW (ref 26.0–34.0)
MCHC: 30.4 g/dL (ref 30.0–36.0)
MCV: 81.8 fL (ref 80.0–100.0)
Monocytes Absolute: 0.4 10*3/uL (ref 0.1–1.0)
Monocytes Relative: 3 %
Neutro Abs: 10.1 10*3/uL — ABNORMAL HIGH (ref 1.7–7.7)
Neutrophils Relative %: 87 %
Platelets: 413 10*3/uL — ABNORMAL HIGH (ref 150–400)
RBC: 3.9 MIL/uL (ref 3.87–5.11)
RDW: 17.5 % — ABNORMAL HIGH (ref 11.5–15.5)
WBC: 11.6 10*3/uL — ABNORMAL HIGH (ref 4.0–10.5)
nRBC: 0 % (ref 0.0–0.2)

## 2019-02-20 LAB — LIPASE, BLOOD: Lipase: 24 U/L (ref 11–51)

## 2019-02-20 MED ORDER — OXYCODONE-ACETAMINOPHEN 5-325 MG PO TABS: 1.0000 | ORAL_TABLET | ORAL | 0 refills | Status: DC | PRN

## 2019-02-20 MED ORDER — ONDANSETRON HCL 4 MG PO TABS: 4.0000 mg | ORAL_TABLET | Freq: Four times a day (QID) | ORAL | 0 refills | Status: DC | PRN

## 2019-02-20 MED ORDER — MORPHINE SULFATE (PF) 4 MG/ML IV SOLN
4.0000 mg | Freq: Once | INTRAVENOUS | Status: AC
  Administered 2019-02-20: 06:00:00 4 mg via INTRAVENOUS
  Filled 2019-02-20: qty 1

## 2019-02-20 MED ORDER — ONDANSETRON HCL 4 MG/2ML IJ SOLN
4.0000 mg | Freq: Once | INTRAMUSCULAR | Status: AC
  Administered 2019-02-20: 06:00:00 4 mg via INTRAVENOUS
  Filled 2019-02-20: qty 2

## 2019-02-20 MED ORDER — MORPHINE SULFATE (PF) 4 MG/ML IV SOLN
4.0000 mg | Freq: Once | INTRAVENOUS | Status: AC
  Administered 2019-02-20: 4 mg via INTRAVENOUS
  Filled 2019-02-20: qty 1

## 2019-02-20 MED ORDER — SODIUM CHLORIDE 0.9 % IV BOLUS
1000.0000 mL | Freq: Once | INTRAVENOUS | Status: AC
  Administered 2019-02-20: 06:00:00 1000 mL via INTRAVENOUS

## 2019-02-20 NOTE — ED Triage Notes (Signed)
Pt arrived to ED with c/o abd pain since 6p yesterday; pt stated that after dinner she begin vomiting (approximately 3/4 times since initial episode). Pt states that pain is in epigastric region and she was dx with H pylori

## 2019-02-20 NOTE — ED Notes (Signed)
Pt discharged from ED; instructions provided and scripts given; Pt encouraged to return to ED if symptoms worsen and to f/u with PCP; Pt verbalized understanding of all instructions pt family member in waiting area

## 2019-02-20 NOTE — ED Provider Notes (Signed)
[facility] EMERGENCY DEPARTMENT Provider Note   CSN: [id number] Arrival date & time: 02/20/19  3664    History   Chief Complaint Chief Complaint  Patient presents with  . Abdominal Pain    HPI [name] is a 53 y.o. female.   The history is provided by the patient.  She has history of hypertension, pre-diabetes, bipolar disorder, asthma and comes in because of severe upper abdominal pain which started last night.  Pain does radiate to the back and into the chest.  There is associated nausea and vomiting.  Pain is not affected by vomiting.  She rates pain at 9/10.  She has had similar pains in the past and has been diagnosed with gallstones.  She had dinner last night which included pork chop, greens, potato salad.  She denies any contact with anyone with COVID-19.  Past Medical History:  Diagnosis Date  . Anemia   . Anxiety   . Asthma   . Bipolar 1 disorder ([place])   . Depression   . Diabetes mellitus without complication ([place])   . GERD (gastroesophageal reflux disease)   . History of chicken pox   . Hypertension   . Sleep apnea    does not use cpap  . Vitamin D deficiency     Patient Active Problem List   Diagnosis Date Noted  . Polyp of colon   . Abdominal pain   . Knee pain, chronic 04/10/2018  . Insulin resistance 04/10/2018  . Vitamin D deficiency   . Sleep apnea 03/31/2017  . Morbid obesity ([place]) 03/31/2017  . Hypertension 03/31/2017  . Bipolar 1 disorder ([place]) 03/31/2017    Past Surgical History:  Procedure Laterality Date  . BIOPSY  12/04/2018   Procedure: BIOPSY;  Surgeon: Thornton Park, MD;  Location: [facility];  Service: Gastroenterology;;  EGD and Colon  . BREAST REDUCTION SURGERY  1986  . COLONOSCOPY WITH PROPOFOL N/A 12/04/2018   Procedure: COLONOSCOPY WITH PROPOFOL;  Surgeon: Thornton Park, MD;  Location: [facility];  Service: Gastroenterology;  Laterality: N/A;  . ESOPHAGOGASTRODUODENOSCOPY (EGD) WITH PROPOFOL N/A  12/04/2018   Procedure: ESOPHAGOGASTRODUODENOSCOPY (EGD) WITH PROPOFOL;  Surgeon: Thornton Park, MD;  Location: [facility];  Service: Gastroenterology;  Laterality: N/A;  . Heart defect surgery    . TONSILLECTOMY       OB History   No obstetric history on file.      Home Medications    Prior to Admission medications   Medication Sig Start Date End Date Taking? Authorizing Provider  ARIPiprazole (ABILIFY) 10 MG tablet Take 10 mg by mouth daily.  10/09/18   [provider]  Diclofenac Sodium (PENNSAID) 2 % SOLN Place 1 application onto the skin 2 (two) times daily. 08/08/18   Gerda Diss, DO  ferrous sulfate 325 (65 FE) MG tablet Take 325 mg by mouth daily with breakfast.    [provider]  lamoTRIgine (LAMICTAL) 25 MG tablet Take 25 mg by mouth daily.     [provider]  lisinopril-hydrochlorothiazide (PRINZIDE,ZESTORETIC) 20-25 MG tablet Take 1 tablet by mouth daily. 12/04/18   [name], PA  metFORMIN (GLUCOPHAGE) 500 MG tablet TAKE 1 TABLET BY MOUTH EVERY DAY WITH BREAKFAST 02/01/19   [name], PA  pantoprazole (PROTONIX) 40 MG tablet Take 1 tablet (40 mg total) by mouth daily. 01/05/19   Thornton Park, MD  traZODone (DESYREL) 50 MG tablet Take 50 mg by mouth at bedtime.    [provider]  Vitamin  D, Ergocalciferol, (DRISDOL) 1.25 MG (50000 UT) CAPS capsule Take 1 capsule (50,000 Units total) by mouth every 7 (seven) days. 01/31/19   [name], PA    Family History Family History  Problem Relation Age of Onset  . Diabetes Mother   . Hypertension Mother   . Stroke Father   . Diabetes Maternal Grandmother   . CVA Maternal Grandmother   . Diabetes Maternal Grandfather   . Colon cancer Paternal Grandmother   . Diabetes Paternal Grandfather   . Breast cancer Maternal Aunt   . Asthma Other   . Cancer Other   . COPD Other   . Esophageal cancer Neg Hx   . Rectal cancer Neg Hx   . Stomach cancer Neg Hx   . Liver  disease Neg Hx     Social History Social History   Tobacco Use  . Smoking status: Never Smoker  . Smokeless tobacco: Never Used  Substance Use Topics  . Alcohol use: Yes    Alcohol/week: 1.0 - 2.0 standard drinks    Types: 1 - 2 Glasses of wine per week  . Drug use: No     Allergies   Patient has no known allergies.   Review of Systems Review of Systems  All other systems reviewed and are negative.    Physical Exam Updated Vital Signs BP 117/71 (BP Location: Right Wrist)   Pulse 95   Temp 98.7 F (37.1 C) (Oral)   Resp 18   Ht 5' (1.524 m)   Wt 134.7 kg   LMP 01/05/2019   SpO2 99%   BMI 58.00 kg/m   Physical Exam Vitals signs and nursing note reviewed.    Obese 53 year old female, resting comfortably and in no acute distress. Vital signs are normal. Oxygen saturation is 99%, which is normal. Head is normocephalic and atraumatic. PERRLA, EOMI. Oropharynx is clear. Neck is nontender and supple without adenopathy or JVD. Back is nontender and there is no CVA tenderness. Lungs are clear without rales, wheezes, or rhonchi. Chest is nontender. Heart has regular rate and rhythm without murmur. Abdomen is soft, flat, with moderate epigastric and right upper quadrant tenderness.  There is no rebound or guarding.  There are no masses or hepatosplenomegaly and peristalsis is hypoactive. Extremities have no cyanosis or edema, full range of motion is present. Skin is warm and dry without rash. Neurologic: Mental status is normal, cranial nerves are intact, there are no motor or sensory deficits.  ED Treatments / Results  Labs (all labs ordered are listed, but only abnormal results are displayed) Labs Reviewed  COMPREHENSIVE METABOLIC PANEL - Abnormal; Notable for the following components:      Result Value   Chloride 96 (*)    Glucose, Bld 144 (*)    Total Protein 8.4 (*)    All other components within normal limits  CBC WITH DIFFERENTIAL/PLATELET - Abnormal;  Notable for the following components:   WBC 11.6 (*)    Hemoglobin 9.7 (*)    HCT 31.9 (*)    MCH 24.9 (*)    RDW 17.5 (*)    Platelets 413 (*)    Neutro Abs 10.1 (*)    All other components within normal limits  LIPASE, BLOOD   Radiology US Abdomen Limited  Result Date: 02/20/2019 CLINICAL DATA:  Right upper quadrant pain, history of cholelithiasis EXAM: ULTRASOUND ABDOMEN LIMITED RIGHT UPPER QUADRANT COMPARISON:  12/06/2017 FINDINGS: Gallbladder: Gallstones are identified. Mild wall thickening is noted within neck of  sonographic Murphy sign. No pericholecystic fluid is seen. Common bile duct: Diameter: Common bile duct is mildly prominent at 10.1 cm although no definitive calculus is seen. No intrahepatic ductal dilatation is seen. Liver: No focal lesion identified. Within normal limits in parenchymal echogenicity. Portal vein is patent on color Doppler imaging with normal direction of blood flow towards the liver. IMPRESSION: Cholelithiasis with gallbladder wall thickening and a negative sonographic Murphy's sign. This is relatively stable from the prior exam. Prominent common bile duct without definitive stone. Correlation with laboratory values is recommended. Electronically Signed   By: Inez Catalina M.D.   On: 02/20/2019 07:25    Procedures Procedures   Medications Ordered in ED Medications  morphine 4 MG/ML injection 4 mg (has no administration in time range)  sodium chloride 0.9 % bolus 1,000 mL (1,000 mLs Intravenous New Bag/Given 02/20/19 0610)  ondansetron (ZOFRAN) injection 4 mg (4 mg Intravenous Given 02/20/19 0610)  morphine 4 MG/ML injection 4 mg (4 mg Intravenous Given 02/20/19 0610)     Initial Impression / Assessment and Plan / ED Course  I have reviewed the triage vital signs and the nursing notes.  Pertinent labs & imaging results that were available during my care of the patient were reviewed by me and considered in my medical decision making (see chart for details).   Upper abdominal pain with vomiting strongly suggestive of biliary colic.  Consider pancreatitis, peptic ulcer, acute gastritis, diverticulitis.  Old records reviewed confirming ultrasound done March 2019 showing multiple gallstones.  She will be given IV fluids, morphine, ondansetron and will be sent for right upper quadrant ultrasound to look for signs of acute cholecystitis.  Ultrasound shows gallstones without evidence of cholecystitis.  Labs showed normal hepatic function, stable anemia.  She had fairly good relief of pain with above-noted treatment but pain started to return and she is given a second dose of morphine.  No indication for admission at this point.  She is discharged with prescriptions for oxycodone-acetaminophen and ondansetron and she is referred to general surgery for evaluation for elective cholecystectomy.  Prior to discharge, I did review her record on the [place] controlled substance reporting website, and she only has 1 narcotic prescription on record that was last September.  Return precautions were discussed.  Final Clinical Impressions(s) / ED Diagnoses   Final diagnoses:  Biliary colic  Normocytic anemia    ED Discharge Orders         Ordered    ondansetron (ZOFRAN) 4 MG tablet  Every 6 hours PRN     02/20/19 0742    oxyCODONE-acetaminophen (PERCOCET) 5-325 MG tablet  Every 4 hours PRN     02/20/19 8341           Delora Fuel, MD 96/22/29 [phone]

## 2019-02-20 NOTE — ED Triage Notes (Signed)
Pt in with c/o sharp generalized abdominal pain since last night, reports multiple episodes of emesis.

## 2019-02-20 NOTE — Discharge Instructions (Signed)
Return if pain is not being adequately treated at home.

## 2019-02-21 ENCOUNTER — Telehealth: Payer: Self-pay | Admitting: Gastroenterology

## 2019-02-21 ENCOUNTER — Other Ambulatory Visit: Payer: Self-pay | Admitting: *Deleted

## 2019-02-21 NOTE — Telephone Encounter (Signed)
Virtual visit schedule next available: 03/05/19 at 11:00 am.  Information faxed to [facility] for surgery referral [phone])

## 2019-02-21 NOTE — Telephone Encounter (Signed)
Thank you for the information. I reviewed her ED records. Please schedule a telehealth encounter with me, next available, to review. Please make a referral to surgery now for their input regarding possible cholecystectomy. Thanks.

## 2019-02-21 NOTE — Telephone Encounter (Signed)
Pt called just wanted to let the doctor know that she was in the hsp last night/early morning with vomiting and stomach pain and was told that she may need to have her gallbladder remove. She wanted to update the doctor so she can advised.

## 2019-02-21 NOTE — Telephone Encounter (Signed)
Spoke with patient who reports an hour after eating a baked pork chop, green beans with butter and potato salad, the patient became "violently ill." She reports 3 episodes of emesis and intense abd cramping. The patient went to the ED for evaluation and per charting documentation: labs (CBC, CMP, and lipase) were drawn and an abd [place] was completed. The patient was treated for pain in the ED with morphine, given zofran and a fluid bolus. The patient was d/c'd with a prescription for zofran and oxycodone/acetaminophen and reports her vomiting and pain have been controlled but was told by the ED provider to follow up with GI and is under the assumption that her gall bladder may need to be removed (per ED provider). The patient is requesting advice or a follow up appt after records have been reviewed by Dr. Tarri Glenn.

## 2019-02-27 ENCOUNTER — Other Ambulatory Visit: Payer: Self-pay | Admitting: Physician Assistant

## 2019-03-01 ENCOUNTER — Ambulatory Visit (INDEPENDENT_AMBULATORY_CARE_PROVIDER_SITE_OTHER): Payer: PPO | Admitting: Physician Assistant

## 2019-03-01 ENCOUNTER — Encounter: Payer: Self-pay | Admitting: Physician Assistant

## 2019-03-01 VITALS — BP 115/62 | HR 108 | Ht 60.0 in | Wt 298.0 lb

## 2019-03-01 DIAGNOSIS — K8050 Calculus of bile duct without cholangitis or cholecystitis without obstruction: Secondary | ICD-10-CM

## 2019-03-01 MED ORDER — METFORMIN HCL 1000 MG PO TABS: 1000.0000 mg | ORAL_TABLET | Freq: Every day | ORAL | 0 refills | Status: DC

## 2019-03-01 NOTE — Progress Notes (Signed)
TELEHEALTH VISIT  Referring Provider: [name], PA Primary Care Physician:  [name], PA   Tele-visit due to COVID-19 pandemic Patient requested visit virtually, consented to the virtual encounter via video enabled program (Zoom) Contact made at: 10:45 03/05/19 Patient verified by name and date of birth Location of patient: Home Location provider: Home Names of persons participating: Me, patient, Tinnie Gens CMA Time spent on telehealth visit: 16 minutes  Chief complaint:  Abdominal pain  IMPRESSION:  Biliary colic with known cholelithiasis    - ED visit 02/20/19    - normal liver enzymes H pylori gastritis    - completed 10 days of clarithromycin 500 mg BID x 10 days and amoxicillin 500 mg BID 12/2018 History of colon polyps    - 3 tubular adenomas on colonoscopy 12/04/18     - surveillance colonoscopy recommended 2023 Abnormal upper GI series 06/30/18 with     - mucosal edema along the greater curvature of the stomach due to H pylori gastritis    - evaluated on EGD 12/04/18: gastritis, no mass or ulcer identified Echogenic liver on ultrasound BMI 58 - under evaluation for bariatric surgery Lactose intolerance  Recent evaluation for biliary colic. Awaiting surgical consultation.   Will proceed with H pylori testing as the patient has been off PPI therapy for at least 2-3 weeks.  PLAN: Awaiting surgical consultation for possible cholecystectomy (referral faxed over to [facility] 02/21/19) H pylori stool antigen now Resume pantoprazole 40 mg QAM after the stool study (#90 with 3 refills today) Surveillance colonoscopy in 3 years    HPI: [name] is a 53 y.o. female with a recent diagnosis of H. pylori gastritis. The interval history is provided through the patient and review of her electronic health record including the records from her recent evaluation in the ED and a visit with [facility].   She was seen in the ED 9/47/6546 for biliary colic.  Abdominal  ultrasound showed cholelithiasis with gallbladder wall thickening and a negative sonographic Murphy sign.  Common bile duct is prominent at 10.1 mm.   Liver enzymes were normal.  Outpatient follow-up with surgery recommended.  She has had no further abdominal pain since that time. Has had two episodes of post-prandial cramping with associated diarrhea and early satiety since she was seen in the ED. But, no other GI complaints.   She has been off pantoprazole for 2-3 weeks in preparation for the H pylori stool antigen.    Past Medical History:  Diagnosis Date  . Anemia   . Anxiety   . Asthma   . Bipolar 1 disorder ([place])   . Depression   . Diabetes mellitus without complication ([place])   . GERD (gastroesophageal reflux disease)   . History of chicken pox   . Hypertension   . Sleep apnea    does not use cpap  . Vitamin D deficiency     Past Surgical History:  Procedure Laterality Date  . BIOPSY  12/04/2018   Procedure: BIOPSY;  Surgeon: Thornton Park, MD;  Location: [facility];  Service: Gastroenterology;;  EGD and Colon  . BREAST REDUCTION SURGERY  1986  . COLONOSCOPY WITH PROPOFOL N/A 12/04/2018   Procedure: COLONOSCOPY WITH PROPOFOL;  Surgeon: Thornton Park, MD;  [facility];  Service: Gastroenterology;  Laterality: N/A;  . ESOPHAGOGASTRODUODENOSCOPY (EGD) WITH PROPOFOL N/A 12/04/2018   Procedure: ESOPHAGOGASTRODUODENOSCOPY (EGD) WITH PROPOFOL;  Surgeon: Thornton Park, MD;  [facility];  Service: Gastroenterology;  Laterality: N/A;  .  Heart defect surgery    . TONSILLECTOMY      Current Outpatient Medications  Medication Sig Dispense Refill  . ARIPiprazole (ABILIFY) 10 MG tablet Take 10 mg by mouth daily.     . Diclofenac Sodium (PENNSAID) 2 % SOLN Place 1 application onto the skin 2 (two) times daily. 112 g 2  . ferrous sulfate 325 (65 FE) MG tablet Take 325 mg by mouth daily with breakfast.    . lamoTRIgine (LAMICTAL) 25 MG tablet Take 25 mg by  mouth daily.     Marland Kitchen lisinopril-hydrochlorothiazide (ZESTORETIC) 20-25 MG tablet TAKE 1 TABLET BY MOUTH EVERY DAY 90 tablet 0  . metFORMIN (GLUCOPHAGE) 1000 MG tablet Take 1 tablet (1,000 mg total) by mouth daily with breakfast. 90 tablet 0  . ondansetron (ZOFRAN) 4 MG tablet Take 1 tablet (4 mg total) by mouth every 6 (six) hours as needed for nausea or vomiting. 12 tablet 0  . traZODone (DESYREL) 50 MG tablet Take 50 mg by mouth at bedtime.    Marland Kitchen oxyCODONE-acetaminophen (PERCOCET) 5-325 MG tablet Take 1 tablet by mouth every 4 (four) hours as needed for moderate pain. (Patient not taking: Reported on 03/01/2019) 10 tablet 0   No current facility-administered medications for this visit.     Allergies as of 03/05/2019  . (No Known Allergies)    Family History  Problem Relation Age of Onset  . Diabetes Mother   . Hypertension Mother   . Stroke Father   . Diabetes Maternal Grandmother   . CVA Maternal Grandmother   . Diabetes Maternal Grandfather   . Colon cancer Paternal Grandmother   . Diabetes Paternal Grandfather   . Breast cancer Maternal Aunt   . Asthma Other   . Cancer Other   . COPD Other   . Esophageal cancer Neg Hx   . Rectal cancer Neg Hx   . Stomach cancer Neg Hx   . Liver disease Neg Hx     Social History   Socioeconomic History  . Marital status: Single    Spouse name: Not on file  . Number of children: Not on file  . Years of education: Not on file  . Highest education level: Not on file  Occupational History  . Not on file  Social Needs  . Financial resource strain: Not on file  . Food insecurity:    Worry: Not on file    Inability: Not on file  . Transportation needs:    Medical: Not on file    Non-medical: Not on file  Tobacco Use  . Smoking status: Never Smoker  . Smokeless tobacco: Never Used  Substance and Sexual Activity  . Alcohol use: Yes    Alcohol/week: 1.0 - 2.0 standard drinks    Types: 1 - 2 Glasses of wine per week  . Drug use: No  .  Sexual activity: Never  Lifestyle  . Physical activity:    Days per week: Not on file    Minutes per session: Not on file  . Stress: Not on file  Relationships  . Social connections:    Talks on phone: Not on file    Gets together: Not on file    Attends religious service: Not on file    Active member of club or organization: Not on file    Attends meetings of clubs or organizations: Not on file    Relationship status: Not on file  . Intimate partner violence:    Fear of current or ex  partner: Not on file    Emotionally abused: Not on file    Physically abused: Not on file    Forced sexual activity: Not on file  Other Topics Concern  . Not on file  Social History Narrative   Lives in [place], was living in [place], [place] for 20 years   No children, not married   Single   Family is nearby, mom and sister nearby   Currently on disability: 4 years ago she had an episode of psychosis (states that she didn't sleep for ~6-8 days prior to) and was in and out of mental institution for 6 months, lost her job and had to move back to [place] to be near family    Review of Systems: ALL ROS discussed and all others negative except listed in HPI.  Physical Exam: General: in no acute distress Neuro: Alert and appropriate Psych: Normal affect and normal insight   Somya Jauregui L. Tarri Glenn, MD, MPH [facility] 03/05/2019, 10:51 AM

## 2019-03-01 NOTE — Progress Notes (Signed)
Virtual Visit via Video   I connected with [name] on 03/01/19 at 10:40 AM EDT by a video enabled telemedicine application and verified that I am speaking with the correct person using two identifiers. Location patient: Home Location provider: [facility], Office Persons participating in the virtual visit: Thresa Dozier Roehrig, [name] PA-C, Anselmo Pickler, LPN   I discussed the limitations of evaluation and management by telemedicine and the availability of in person appointments. The patient expressed understanding and agreed to proceed.  I acted as a Education administrator for [employer], PA-C [employer], LPN  Subjective:   HPI:  ED follow up Pt was seen in ED on 5/19 dx with Biliary colic. Gallstones confirmed on abdominal u/s. She say she is feeling better, no abdominal pain at present. She is planning to see Dr. Tarri Glenn (her GI doc) to follow-up on this on Monday. Referral has been placed to [facility] for chole.  Weight loss Pt following up today, pt says she has gained 2 lbs, present weight is 298 lbs. Trialed Ozempic, didn't have any issues with the medicine but did NOT like administering injections to herself. Diet is variable now due to pandemic. Tolerates metformin well.  ROS: See pertinent positives and negatives per HPI.  Patient Active Problem List   Diagnosis Date Noted  . Polyp of colon   . Abdominal pain   . Knee pain, chronic 04/10/2018  . Insulin resistance 04/10/2018  . Vitamin D deficiency   . Sleep apnea 03/31/2017  . Morbid obesity ([place]) 03/31/2017  . Hypertension 03/31/2017  . Bipolar 1 disorder ([place]) 03/31/2017    Social History   Tobacco Use  . Smoking status: Never Smoker  . Smokeless tobacco: Never Used  Substance Use Topics  . Alcohol use: Yes    Alcohol/week: 1.0 - 2.0 standard drinks    Types: 1 - 2 Glasses of wine per week    Current Outpatient Medications:  .  ARIPiprazole (ABILIFY) 10 MG tablet, Take 10 mg by mouth daily. , Disp: , Rfl:   .  Diclofenac Sodium (PENNSAID) 2 % SOLN, Place 1 application onto the skin 2 (two) times daily., Disp: 112 g, Rfl: 2 .  ferrous sulfate 325 (65 FE) MG tablet, Take 325 mg by mouth daily with breakfast., Disp: , Rfl:  .  lamoTRIgine (LAMICTAL) 25 MG tablet, Take 25 mg by mouth daily. , Disp: , Rfl:  .  lisinopril-hydrochlorothiazide (ZESTORETIC) 20-25 MG tablet, TAKE 1 TABLET BY MOUTH EVERY DAY, Disp: 90 tablet, Rfl: 0 .  ondansetron (ZOFRAN) 4 MG tablet, Take 1 tablet (4 mg total) by mouth every 6 (six) hours as needed for nausea or vomiting., Disp: 12 tablet, Rfl: 0 .  traZODone (DESYREL) 50 MG tablet, Take 50 mg by mouth at bedtime., Disp: , Rfl:  .  metFORMIN (GLUCOPHAGE) 1000 MG tablet, Take 1 tablet (1,000 mg total) by mouth daily with breakfast., Disp: 90 tablet, Rfl: 0 .  oxyCODONE-acetaminophen (PERCOCET) 5-325 MG tablet, Take 1 tablet by mouth every 4 (four) hours as needed for moderate pain. (Patient not taking: Reported on 03/01/2019), Disp: 10 tablet, Rfl: 0  No Known Allergies  Objective:   VITALS: Per patient if applicable, see vitals. GENERAL: Alert, appears well and in no acute distress. HEENT: Atraumatic, conjunctiva clear, no obvious abnormalities on inspection of external nose and ears. NECK: Normal movements of the head and neck. CARDIOPULMONARY: No increased WOB. Speaking in clear sentences. I:E ratio WNL.  MS: Moves all visible extremities  without noticeable abnormality. PSYCH: Pleasant and cooperative, well-groomed. Speech normal rate and rhythm. Affect is appropriate. Insight and judgement are appropriate. Attention is focused, linear, and appropriate.  NEURO: CN grossly intact. Oriented as arrived to appointment on time with no prompting. Moves both UE equally.  SKIN: No obvious lesions, wounds, erythema, or cyanosis noted on face or hands.  Assessment and Plan:   Sherine was seen today for follow-up and weight loss.  Diagnoses and all orders for this visit:   Biliary colic No symptoms presently. Aware of red flags and worsening precautions. GI and gen surgery following.  Morbid obesity ([place]) Continues efforts. Discussed options -- she doesn't want to do any more injectables. Will increase Metformin to 1000 mg daily for now, recheck A1c when due and go from there. May be good candidate for Rebylsus.  Other orders -     metFORMIN (GLUCOPHAGE) 1000 MG tablet; Take 1 tablet (1,000 mg total) by mouth daily with breakfast.   . Reviewed expectations re: course of current medical issues. . Discussed self-management of symptoms. . Outlined signs and symptoms indicating need for more acute intervention. . Patient verbalized understanding and all questions were answered. Marland Kitchen Health Maintenance issues including appropriate healthy diet, exercise, and smoking avoidance were discussed with patient. . See orders for this visit as documented in the electronic medical record.  I discussed the assessment and treatment plan with the patient. The patient was provided an opportunity to ask questions and all were answered. The patient agreed with the plan and demonstrated an understanding of the instructions.   The patient was advised to call back or seek an in-person evaluation if the symptoms worsen or if the condition fails to improve as anticipated.   CMA or LPN served as scribe during this visit. History, Physical, and Plan performed by medical provider. The above documentation has been reviewed and is accurate and complete.  [place], [place] 03/01/2019

## 2019-03-05 ENCOUNTER — Ambulatory Visit (INDEPENDENT_AMBULATORY_CARE_PROVIDER_SITE_OTHER): Payer: PPO | Admitting: Gastroenterology

## 2019-03-05 ENCOUNTER — Encounter: Payer: Self-pay | Admitting: General Surgery

## 2019-03-05 ENCOUNTER — Other Ambulatory Visit: Payer: Self-pay

## 2019-03-05 VITALS — Ht 60.0 in | Wt 298.0 lb

## 2019-03-05 DIAGNOSIS — A048 Other specified bacterial intestinal infections: Secondary | ICD-10-CM | POA: Diagnosis not present

## 2019-03-05 DIAGNOSIS — K8050 Calculus of bile duct without cholangitis or cholecystitis without obstruction: Secondary | ICD-10-CM

## 2019-03-05 MED ORDER — PANTOPRAZOLE SODIUM 40 MG PO TBEC: 40.0000 mg | DELAYED_RELEASE_TABLET | Freq: Every day | ORAL | 3 refills | Status: DC

## 2019-03-05 NOTE — Patient Instructions (Signed)
We will follow-up on your referral to the surgeon.  Please stop by the lab at your convenience for the stool test to follow-up on your H pylori stomach infection.  Resume pantoprazole 40 mg every morning after you turn in your stool test.  Let me know how the consultation goes with the surgeon.  Call with me any questions or concerns in the meantime.

## 2019-03-14 ENCOUNTER — Ambulatory Visit: Payer: Self-pay | Admitting: Surgery

## 2019-03-14 DIAGNOSIS — K2970 Gastritis, unspecified, without bleeding: Secondary | ICD-10-CM | POA: Diagnosis not present

## 2019-03-14 DIAGNOSIS — K8050 Calculus of bile duct without cholangitis or cholecystitis without obstruction: Secondary | ICD-10-CM | POA: Diagnosis not present

## 2019-03-14 DIAGNOSIS — B9681 Helicobacter pylori [H. pylori] as the cause of diseases classified elsewhere: Secondary | ICD-10-CM | POA: Diagnosis not present

## 2019-03-14 NOTE — H&P (View-Only) (Signed)
Surgical H&P  CC: abdominal pain  HPI: This is a very nice woman, known to me as I saw her for bariatric consultation in September last year.  She has nearly completed the bariatric pathway, but decided to stop the process as she did not feel that she was mentally ready to undergo such a major endeavor.  In the interim, her preoperative upper GI did show some abnormalities in the gastric mucosa and subsequent endoscopy diagnosed her with H. pylori gastritis.  She has completed treatment for this and is following up with Dr. Tarri Glenn.  In the last few months, she has had several episodes of postprandial epigastric pain associated with severe nausea and vomiting.  She had a right upper quadrant ultrasound last month that does confirm gallstones and some gallbladder wall thickening.  She reports occasional diarrhea.  No Known Allergies  Past Medical History:  Diagnosis Date  . Anemia   . Anxiety   . Asthma   . Bipolar 1 disorder ([place])   . Depression   . Diabetes mellitus without complication ([place])   . GERD (gastroesophageal reflux disease)   . History of chicken pox   . Hypertension   . Sleep apnea    does not use cpap  . Vitamin D deficiency     Past Surgical History:  Procedure Laterality Date  . BIOPSY  12/04/2018   Procedure: BIOPSY;  Surgeon: Thornton Park, MD;  [facility];  Service: Gastroenterology;;  EGD and Colon  . BREAST REDUCTION SURGERY  1986  . COLONOSCOPY WITH PROPOFOL N/A 12/04/2018   Procedure: COLONOSCOPY WITH PROPOFOL;  Surgeon: Thornton Park, MD;  [facility];  Service: Gastroenterology;  Laterality: N/A;  . ESOPHAGOGASTRODUODENOSCOPY (EGD) WITH PROPOFOL N/A 12/04/2018   Procedure: ESOPHAGOGASTRODUODENOSCOPY (EGD) WITH PROPOFOL;  Surgeon: Thornton Park, MD;  [facility];  Service: Gastroenterology;  Laterality: N/A;  . Heart defect surgery    . TONSILLECTOMY      Family History  Problem Relation Age of Onset  . Diabetes  Mother   . Hypertension Mother   . Stroke Father   . Diabetes Maternal Grandmother   . CVA Maternal Grandmother   . Diabetes Maternal Grandfather   . Colon cancer Paternal Grandmother   . Diabetes Paternal Grandfather   . Breast cancer Maternal Aunt   . Asthma Other   . Cancer Other   . COPD Other   . Esophageal cancer Neg Hx   . Rectal cancer Neg Hx   . Stomach cancer Neg Hx   . Liver disease Neg Hx     Social History   Socioeconomic History  . Marital status: Single    Spouse name: Not on file  . Number of children: Not on file  . Years of education: Not on file  . Highest education level: Not on file  Occupational History  . Not on file  Social Needs  . Financial resource strain: Not on file  . Food insecurity:    Worry: Not on file    Inability: Not on file  . Transportation needs:    Medical: Not on file    Non-medical: Not on file  Tobacco Use  . Smoking status: Never Smoker  . Smokeless tobacco: Never Used  Substance and Sexual Activity  . Alcohol use: Yes    Alcohol/week: 1.0 - 2.0 standard drinks    Types: 1 - 2 Glasses of wine per week  . Drug use: No  . Sexual activity: Never  Lifestyle  .  Physical activity:    Days per week: Not on file    Minutes per session: Not on file  . Stress: Not on file  Relationships  . Social connections:    Talks on phone: Not on file    Gets together: Not on file    Attends religious service: Not on file    Active member of club or organization: Not on file    Attends meetings of clubs or organizations: Not on file    Relationship status: Not on file  Other Topics Concern  . Not on file  Social History Narrative   Lives in [place], was living in [place], [place] for 20 years   No children, not married   Single   Family is nearby, mom and sister nearby   Currently on disability: 4 years ago she had an episode of psychosis (states that she didn't sleep for ~6-8 days prior to) and was in and out of mental institution  for 6 months, lost her job and had to move back to [place] to be near family    Current Outpatient Medications on File Prior to Visit  Medication Sig Dispense Refill  . ARIPiprazole (ABILIFY) 10 MG tablet Take 10 mg by mouth daily.     . Diclofenac Sodium (PENNSAID) 2 % SOLN Place 1 application onto the skin 2 (two) times daily. 112 g 2  . ferrous sulfate 325 (65 FE) MG tablet Take 325 mg by mouth daily with breakfast.    . lamoTRIgine (LAMICTAL) 25 MG tablet Take 25 mg by mouth daily.     Marland Kitchen lisinopril-hydrochlorothiazide (ZESTORETIC) 20-25 MG tablet TAKE 1 TABLET BY MOUTH EVERY DAY 90 tablet 0  . metFORMIN (GLUCOPHAGE) 1000 MG tablet Take 1 tablet (1,000 mg total) by mouth daily with breakfast. 90 tablet 0  . ondansetron (ZOFRAN) 4 MG tablet Take 1 tablet (4 mg total) by mouth every 6 (six) hours as needed for nausea or vomiting. 12 tablet 0  . oxyCODONE-acetaminophen (PERCOCET) 5-325 MG tablet Take 1 tablet by mouth every 4 (four) hours as needed for moderate pain. (Patient not taking: Reported on 03/01/2019) 10 tablet 0  . pantoprazole (PROTONIX) 40 MG tablet Take 1 tablet (40 mg total) by mouth daily. 30 tablet 3  . traZODone (DESYREL) 50 MG tablet Take 50 mg by mouth at bedtime.     No current facility-administered medications on file prior to visit.     Review of Systems: a complete, 10pt review of systems was completed with pertinent positives and negatives as documented in the HPI  Physical Exam: T 97.7 P 111 BP 134/82 wt 296.2, BMI 55.97  Gen: alert and well appearing Eye: extraocular motion intact, no scleral icterus ENT: moist mucus membranes, dentition intact Neck: no mass or thyromegaly Chest: unlabored respirations, symmetrical air entry, clear bilaterally CV: regular rate and rhythm, no pedal edema Abdomen: obese, mildly tender in epigastrium and right paramedian subcostal margin. No mass or organomegaly MSK: strength symmetrical throughout, no deformity Neuro: grossly  intact, normal gait Psych: normal mood and affect, appropriate insight Skin: warm and dry, no rash or lesion on limited exam    CBC Latest Ref Rng & Units 02/20/2019 01/30/2019 10/02/2018  WBC 4.0 - 10.5 K/uL 11.6(H) 6.6 9.2  Hemoglobin 12.0 - 15.0 g/dL 9.7(L) 9.6(L) 9.2(L)  Hematocrit 36.0 - 46.0 % 31.9(L) 30.3(L) 28.9(L)  Platelets 150 - 400 K/uL 413(H) 431.0(H) 401.0(H)    CMP Latest Ref Rng & Units 02/20/2019 01/30/2019 09/12/2018  Glucose 70 -  99 mg/dL 144(H) 110(H) 111(H)  BUN 6 - 20 mg/dL 13 15 21   Creatinine 0.44 - 1.00 mg/dL 0.88 0.85 0.82  Sodium 135 - 145 mmol/L 136 136 137  Potassium 3.5 - 5.1 mmol/L 3.8 4.0 3.8  Chloride 98 - 111 mmol/L 96(L) 98 99  CO2 22 - 32 mmol/L 25 28 29   Calcium 8.9 - 10.3 mg/dL 9.2 8.9 10.4  Total Protein 6.5 - 8.1 g/dL 8.4(H) 7.4 8.2  Total Bilirubin 0.3 - 1.2 mg/dL 0.6 0.4 0.4  Alkaline Phos 38 - 126 U/L 68 78 69  AST 15 - 41 U/L 18 10 27   ALT 0 - 44 U/L 18 11 38(H)    Lab Results  Component Value Date   INR 1.2 (H) 09/12/2018    Imaging: No results found.    A/P: Biliary colic. I recommend proceeding with laparoscopic cholecystectomy with possible cholangiogram. Discussed risks of surgery including bleeding, pain, scarring, intraabdominal injury specifically to the common bile duct and sequelae, bile leak, conversion to open surgery, blood clot, pneumonia, heart attack, stroke, failure to resolve symptoms, etc/ Questions welcomed and answered. Morbid obesity- she does not feel ready to proceed with sleeve yet. She has completed much of the bariatric workup but felt that she did not have the mental preparedness she needs to be successful. Encouraged her to continue with her weight loss efforts and I will continue to support that whether she proceeds with bariatric surgery or not H pylori gastritis- completed medical tx, f/u with Dr. Ranae Palms, MD [facility], [place] Pager [phone]

## 2019-03-14 NOTE — H&P (Signed)
Surgical H&P  CC: abdominal pain  HPI: This is a very nice woman, known to me as I saw her for bariatric consultation in September last year.  [name] has nearly completed the bariatric pathway, but decided to stop the process as [name] did not feel that [name] was mentally ready to undergo such a major endeavor.  In the interim, her preoperative upper GI did show some abnormalities in the gastric mucosa and subsequent endoscopy diagnosed her with H. pylori gastritis.  [name] has completed treatment for this and is following up with Dr. Tarri Glenn.  In the last few months, [name] has had several episodes of postprandial epigastric pain associated with severe nausea and vomiting.  [name] had a right upper quadrant ultrasound last month that does confirm gallstones and some gallbladder wall thickening.  [name] reports occasional diarrhea.  No Known Allergies  Past Medical History:  Diagnosis Date  . Anemia   . Anxiety   . Asthma   . Bipolar 1 disorder ([place])   . Depression   . Diabetes mellitus without complication ([place])   . GERD (gastroesophageal reflux disease)   . History of chicken pox   . Hypertension   . Sleep apnea    does not use cpap  . Vitamin D deficiency     Past Surgical History:  Procedure Laterality Date  . BIOPSY  12/04/2018   Procedure: BIOPSY;  Surgeon: Thornton Park, MD;  [facility]: [facility];  Service: Gastroenterology;;  EGD and Colon  . BREAST REDUCTION SURGERY  1986  . COLONOSCOPY WITH PROPOFOL N/A 12/04/2018   Procedure: COLONOSCOPY WITH PROPOFOL;  Surgeon: Thornton Park, MD;  [facility];  Service: Gastroenterology;  Laterality: N/A;  . ESOPHAGOGASTRODUODENOSCOPY (EGD) WITH PROPOFOL N/A 12/04/2018   Procedure: ESOPHAGOGASTRODUODENOSCOPY (EGD) WITH PROPOFOL;  Surgeon: Thornton Park, MD;  [facility];  Service: Gastroenterology;  Laterality: N/A;  . Heart defect surgery    . TONSILLECTOMY      Family History  Problem Relation Age of Onset  . Diabetes  Mother   . Hypertension Mother   . Stroke Father   . Diabetes Maternal Grandmother   . CVA Maternal Grandmother   . Diabetes Maternal Grandfather   . Colon cancer Paternal Grandmother   . Diabetes Paternal Grandfather   . Breast cancer Maternal Aunt   . Asthma Other   . Cancer Other   . COPD Other   . Esophageal cancer Neg Hx   . Rectal cancer Neg Hx   . Stomach cancer Neg Hx   . Liver disease Neg Hx     Social History   Socioeconomic History  . Marital status: Single    Spouse name: Not on file  . Number of children: Not on file  . Years of education: Not on file  . Highest education level: Not on file  Occupational History  . Not on file  Social Needs  . Financial resource strain: Not on file  . Food insecurity:    Worry: Not on file    Inability: Not on file  . Transportation needs:    Medical: Not on file    Non-medical: Not on file  Tobacco Use  . Smoking status: Never Smoker  . Smokeless tobacco: Never Used  Substance and Sexual Activity  . Alcohol use: Yes    Alcohol/week: 1.0 - 2.0 standard drinks    Types: 1 - 2 Glasses of wine per week  . Drug use: No  . Sexual activity: Never  Lifestyle  .  Physical activity:    Days per week: Not on file    Minutes per session: Not on file  . Stress: Not on file  Relationships  . Social connections:    Talks on phone: Not on file    Gets together: Not on file    Attends religious service: Not on file    Active member of club or organization: Not on file    Attends meetings of clubs or organizations: Not on file    Relationship status: Not on file  Other Topics Concern  . 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 ARIPiprazole (ABILIFY) 10 MG tablet Take 10 mg by mouth daily.     . Diclofenac Sodium (PENNSAID) 2 % SOLN Place 1 application onto the skin 2 (two) times daily. 112 g 2  . ferrous sulfate 325 (65 FE) MG tablet Take 325 mg by mouth daily with breakfast.    . lamoTRIgine (LAMICTAL) 25 MG tablet Take 25 mg by mouth daily.     Marland Kitchen lisinopril-hydrochlorothiazide (ZESTORETIC) 20-25 MG tablet TAKE 1 TABLET BY MOUTH EVERY DAY 90 tablet 0  . metFORMIN (GLUCOPHAGE) 1000 MG tablet Take 1 tablet (1,000 mg total) by mouth daily with breakfast. 90 tablet 0  . ondansetron (ZOFRAN) 4 MG tablet Take 1 tablet (4 mg total) by mouth every 6 (six) hours as needed for nausea or vomiting. 12 tablet 0  . oxyCODONE-acetaminophen (PERCOCET) 5-325 MG tablet Take 1 tablet by mouth every 4 (four) hours as needed for moderate pain. (Patient not taking: Reported on 03/01/2019) 10 tablet 0  . pantoprazole (PROTONIX) 40 MG tablet Take 1 tablet (40 mg total) by mouth daily. 30 tablet 3  . traZODone (DESYREL) 50 MG tablet Take 50 mg by mouth at bedtime.     No current facility-administered medications on file prior to visit.     Review of Systems: a complete, 10pt review of systems was completed with pertinent positives and negatives as documented in the HPI  Physical Exam: T 97.7 P 111 BP 134/82 wt 296.2, BMI 55.97  Gen: alert and well appearing Eye: extraocular motion intact, no scleral icterus ENT: moist mucus membranes, dentition intact Neck: no mass or thyromegaly Chest: unlabored respirations, symmetrical air entry, clear bilaterally CV: regular rate and rhythm, no pedal edema Abdomen: obese, mildly tender in epigastrium and right paramedian subcostal margin. No mass or organomegaly MSK: strength symmetrical throughout, no deformity Neuro: grossly  intact, normal gait Psych: normal mood and affect, appropriate insight Skin: warm and dry, no rash or lesion on limited exam    CBC Latest Ref Rng & Units 02/20/2019 01/30/2019 10/02/2018  WBC 4.0 - 10.5 K/uL 11.6(H) 6.6 9.2  Hemoglobin 12.0 - 15.0 g/dL 9.7(L) 9.6(L) 9.2(L)  Hematocrit 36.0 - 46.0 % 31.9(L) 30.3(L) 28.9(L)  Platelets 150 - 400 K/uL 413(H) 431.0(H) 401.0(H)    CMP Latest Ref Rng & Units 02/20/2019 01/30/2019 09/12/2018  Glucose 70 -  99 mg/dL 144(H) 110(H) 111(H)  BUN 6 - 20 mg/dL 13 15 21   Creatinine 0.44 - 1.00 mg/dL 0.88 0.85 0.82  Sodium 135 - 145 mmol/L 136 136 137  Potassium 3.5 - 5.1 mmol/L 3.8 4.0 3.8  Chloride 98 - 111 mmol/L 96(L) 98 99  CO2 22 - 32 mmol/L 25 28 29   Calcium 8.9 - 10.3 mg/dL 9.2 8.9 10.4  Total Protein 6.5 - 8.1 g/dL 8.4(H) 7.4 8.2  Total Bilirubin 0.3 - 1.2 mg/dL 0.6 0.4 0.4  Alkaline Phos 38 - 126 U/L 68 78 69  AST 15 - 41 U/L 18 10 27   ALT 0 - 44 U/L 18 11 38(H)    Lab Results  Component Value Date   INR 1.2 (H) 09/12/2018    Imaging: No results found.    A/P: Biliary colic. I recommend proceeding with laparoscopic cholecystectomy with possible cholangiogram. Discussed risks of surgery including bleeding, pain, scarring, intraabdominal injury specifically to the common bile duct and sequelae, bile leak, conversion to open surgery, blood clot, pneumonia, heart attack, stroke, failure to resolve symptoms, etc/ Questions welcomed and answered. Morbid obesity- [name] does not feel ready to proceed with sleeve yet. [name] has completed much of the bariatric workup but felt that [name] did not have the mental preparedness [name] needs to be successful. Encouraged her to continue with her weight loss efforts and I will continue to support that whether [name] proceeds with bariatric surgery or not H pylori gastritis- completed medical tx, f/u with Dr. Ranae Palms, MD [facility], [place] Pager [phone]

## 2019-03-19 NOTE — Patient Instructions (Addendum)
Tressie Ragin Mcmillion    Your procedure is scheduled on: 03-23-2019  Report to [facility] Main  Entrance  Report to [place] at 530 AM   [place] 19 TEST ON TODAY @_______ , THIS TEST MUST BE DONE BEFORE SURGERY, COME TO [facility]. ONCE YOUR COVID TEST IS COMPLETED, PLEASE BEGIN THE QUARANTINE INSTRUCTIONS AS OUTLINED IN YOUR HANDOUT.   Call this number if you have problems the morning of surgery [phone]    Remember: Do not eat food or drink liquids :After Midnight. BRUSH YOUR TEETH MORNING OF SURGERY AND RINSE YOUR MOUTH OUT, NO CHEWING GUM CANDY OR MINTS.     Take these medicines the morning of surgery with A SIP OF WATER: ALBUTEROL INHALER IF NEEDED AND BRING INHALER  DO NOT TAKE ANY DIABETIC MEDICATIONS DAY OF YOUR SURGERY       How to Manage Your Diabetes Before and After Surgery  Why is it important to control my blood sugar before and after surgery? . Improving blood sugar levels before and after surgery helps healing and can limit problems. . A way of improving blood sugar control is eating a healthy diet by: o  Eating less sugar and carbohydrates o  Increasing activity/exercise o  Talking with your doctor about reaching your blood sugar goals . High blood sugars (greater than 180 mg/dL) can raise your risk of infections and slow your recovery, so you will need to focus on controlling your diabetes during the weeks before surgery. . Make sure that the doctor who takes care of your diabetes knows about your planned surgery including the date and location.  How do I manage my blood sugar before surgery? . Check your blood sugar at least 4 times a day, starting 2 days before surgery, to make sure that the level is not too high or low. o Check your blood sugar the morning of your surgery when you wake up and every 2 hours until you get to the Short Stay unit. . If your blood sugar is less than 70 mg/dL,  you will need to treat for low blood sugar: o Do not take insulin. o Treat a low blood sugar (less than 70 mg/dL) with  cup of clear juice (cranberry or apple), 4 glucose tablets, OR glucose gel. o Recheck blood sugar in 15 minutes after treatment (to make sure it is greater than 70 mg/dL). If your blood sugar is not greater than 70 mg/dL on recheck, call [phone] for further instructions. . Report your blood sugar to the short stay nurse when you get to Short Stay.  . If you are admitted to the hospital after surgery: o Your blood sugar will be checked by the staff and you will probably be given insulin after surgery (instead of oral diabetes medicines) to make sure you have good blood sugar levels. o The goal for blood sugar control after surgery is 80-180 mg/dL.   WHAT DO I DO ABOUT MY DIABETES MEDICATION?  Marland Kitchen Do not take oral diabetes medicines (pills) the morning of surgery.  . THE DAY  BEFORE SURGERY TAKE METFORMIN AS USUAL..       THE MORNING OF SURGERY DO NOT TAKE METFORMIN     Reviewed and Endorsed by [facility] Patient Education Committee, August 2015  You may not have any metal on your body including hair pins and              piercings  Do not wear jewelry, make-up, lotions, powders or perfumes, deodorant             Do not wear nail polish.  Do not shave  48 hours prior to surgery.                Do not bring valuables to the hospital. [place].  Contacts, dentures or bridgework may not be worn into surgery.  Leave suitcase in the car. After surgery it may be brought to your room.     Patients discharged the day of surgery will not be allowed to drive home. IF YOU ARE HAVING SURGERY AND GOING HOME THE SAME DAY, YOU MUST HAVE AN ADULT TO DRIVE YOU HOME AND BE WITH YOU FOR 24 HOURS. YOU MAY GO HOME BY TAXI OR UBER OR ORTHERWISE, BUT AN ADULT MUST ACCOMPANY YOU HOME AND STAY WITH YOU FOR 24  HOURS.  Name and phone number of your driver:  Special Instructions: N/A              Please read over the following fact sheets you were given: _____________________________________________________________________             [facility] [place] - Preparing for Surgery Before surgery, you can play an important role.  Because skin is not sterile, your skin needs to be as free of germs as possible.  You can reduce the number of germs on your skin by washing with CHG (chlorahexidine gluconate) soap before surgery.  CHG is an antiseptic cleaner which kills germs and bonds with the skin to continue killing germs even after washing. Please DO NOT use if you have an allergy to CHG or antibacterial soaps.  If your skin becomes reddened/irritated stop using the CHG and inform your nurse when you arrive at Short Stay. Do not shave (including legs and underarms) for at least 48 hours prior to the first CHG shower.  You may shave your face/neck. Please follow these instructions carefully:  1.  Shower with CHG Soap the night before surgery and the  morning of Surgery.  2.  If you choose to wash your hair, wash your hair first as usual with your  normal  shampoo.  3.  After you shampoo, rinse your hair and body thoroughly to remove the  shampoo.                           4.  Use CHG as you would any other liquid soap.  You can apply chg directly  to the skin and wash                       Gently with a scrungie or clean washcloth.  5.  Apply the CHG Soap to your body ONLY FROM THE NECK DOWN.   Do not use on face/ open                           Wound or open sores. Avoid contact with eyes, ears mouth and genitals (private parts).  Wash face,  Genitals (private parts) with your normal soap.             6.  Wash thoroughly, paying special attention to the area where your surgery  will be performed.  7.  Thoroughly rinse your body with warm water from the neck down.  8.  DO NOT shower/wash with  your normal soap after using and rinsing off  the CHG Soap.                9.  Pat yourself dry with a clean towel.            10.  Wear clean pajamas.            11.  Place clean sheets on your bed the night of your first shower and do not  sleep with pets. Day of Surgery : Do not apply any lotions/deodorants the morning of surgery.  Please wear clean clothes to the hospital/surgery center.  FAILURE TO FOLLOW THESE INSTRUCTIONS MAY RESULT IN THE CANCELLATION OF YOUR SURGERY PATIENT SIGNATURE_________________________________  NURSE SIGNATURE__________________________________  ________________________________________________________________________

## 2019-03-19 NOTE — Progress Notes (Addendum)
EKG 09-12-18 Epic CHEST XRAY 06-30-18 Epic HEMAGLOBIN A1C 01-30-19 EPIC

## 2019-03-20 ENCOUNTER — Encounter (HOSPITAL_COMMUNITY): Payer: Self-pay

## 2019-03-20 ENCOUNTER — Other Ambulatory Visit: Payer: Self-pay

## 2019-03-20 ENCOUNTER — Other Ambulatory Visit (HOSPITAL_COMMUNITY)
Admission: RE | Admit: 2019-03-20 | Discharge: 2019-03-20 | Disposition: A | Discharge status: Home / Self Care | Payer: PPO | Source: Ambulatory Visit | Attending: Surgery | Admitting: Surgery

## 2019-03-20 ENCOUNTER — Encounter (HOSPITAL_COMMUNITY)
Admission: RE | Admit: 2019-03-20 | Discharge: 2019-03-20 | Disposition: A | Discharge status: Home / Self Care | Payer: PPO | Source: Ambulatory Visit | Attending: Surgery | Admitting: Surgery

## 2019-03-20 DIAGNOSIS — Z01812 Encounter for preprocedural laboratory examination: Secondary | ICD-10-CM | POA: Insufficient documentation

## 2019-03-20 DIAGNOSIS — Z6841 Body Mass Index (BMI) 40.0 and over, adult: Secondary | ICD-10-CM | POA: Diagnosis not present

## 2019-03-20 DIAGNOSIS — K8050 Calculus of bile duct without cholangitis or cholecystitis without obstruction: Secondary | ICD-10-CM | POA: Insufficient documentation

## 2019-03-20 DIAGNOSIS — K219 Gastro-esophageal reflux disease without esophagitis: Secondary | ICD-10-CM | POA: Diagnosis not present

## 2019-03-20 DIAGNOSIS — Z7984 Long term (current) use of oral hypoglycemic drugs: Secondary | ICD-10-CM | POA: Diagnosis not present

## 2019-03-20 DIAGNOSIS — Z79899 Other long term (current) drug therapy: Secondary | ICD-10-CM | POA: Diagnosis not present

## 2019-03-20 DIAGNOSIS — G4730 Sleep apnea, unspecified: Secondary | ICD-10-CM | POA: Diagnosis not present

## 2019-03-20 DIAGNOSIS — I10 Essential (primary) hypertension: Secondary | ICD-10-CM | POA: Diagnosis not present

## 2019-03-20 DIAGNOSIS — Z1159 Encounter for screening for other viral diseases: Secondary | ICD-10-CM | POA: Diagnosis not present

## 2019-03-20 DIAGNOSIS — E119 Type 2 diabetes mellitus without complications: Secondary | ICD-10-CM | POA: Diagnosis not present

## 2019-03-20 DIAGNOSIS — K8012 Calculus of gallbladder with acute and chronic cholecystitis without obstruction: Secondary | ICD-10-CM | POA: Diagnosis not present

## 2019-03-20 DIAGNOSIS — R109 Unspecified abdominal pain: Secondary | ICD-10-CM | POA: Diagnosis present

## 2019-03-20 DIAGNOSIS — D649 Anemia, unspecified: Secondary | ICD-10-CM | POA: Diagnosis not present

## 2019-03-20 DIAGNOSIS — F319 Bipolar disorder, unspecified: Secondary | ICD-10-CM | POA: Diagnosis not present

## 2019-03-20 HISTORY — DX: Personal history of other diseases of the nervous system and sense organs: Z86.69

## 2019-03-20 HISTORY — DX: Atrial septal defect: Q21.1

## 2019-03-20 HISTORY — DX: Obesity, unspecified: E66.9

## 2019-03-20 HISTORY — DX: Patent foramen ovale: Q21.12

## 2019-03-20 HISTORY — DX: Personal history of other infectious and parasitic diseases: Z86.19

## 2019-03-20 HISTORY — DX: Cardiac murmur, unspecified: R01.1

## 2019-03-20 LAB — BASIC METABOLIC PANEL
Anion gap: 11 (ref 5–15)
BUN: 14 mg/dL (ref 6–20)
CO2: 26 mmol/L (ref 22–32)
Calcium: 9.1 mg/dL (ref 8.9–10.3)
Chloride: 101 mmol/L (ref 98–111)
Creatinine, Ser: 0.82 mg/dL (ref 0.44–1.00)
GFR calc Af Amer: >60 mL/min (ref >60)
GFR calc non Af Amer: >60 mL/min (ref >60)
Glucose, Bld: 108 mg/dL — ABNORMAL HIGH (ref 70–99)
Potassium: 4 mmol/L (ref 3.5–5.1)
Sodium: 138 mmol/L (ref 135–145)

## 2019-03-20 LAB — CBC
HCT: 32.5 % — ABNORMAL LOW (ref 36.0–46.0)
Hemoglobin: 9.5 g/dL — ABNORMAL LOW (ref 12.0–15.0)
MCH: 24.9 pg — ABNORMAL LOW (ref 26.0–34.0)
MCHC: 29.2 g/dL — ABNORMAL LOW (ref 30.0–36.0)
MCV: 85.1 fL (ref 80.0–100.0)
Platelets: 379 10*3/uL (ref 150–400)
RBC: 3.82 MIL/uL — ABNORMAL LOW (ref 3.87–5.11)
RDW: 17.9 % — ABNORMAL HIGH (ref 11.5–15.5)
WBC: 6.3 10*3/uL (ref 4.0–10.5)
nRBC: 0 % (ref 0.0–0.2)

## 2019-03-20 LAB — GLUCOSE, CAPILLARY: Glucose-Capillary: 122 mg/dL — ABNORMAL HIGH (ref 70–99)

## 2019-03-20 NOTE — Progress Notes (Signed)
SPOKE W/  Jameriah     SCREENING SYMPTOMS OF COVID 19:   COUGH--NO  RUNNY NOSE--- NO  SORE THROAT---NO  NASAL CONGESTION----NO  SNEEZING----NO  SHORTNESS OF BREATH--NO-  DIFFICULTY BREATHING---NO  TEMP >100.0 -----NO  UNEXPLAINED BODY ACHES------NO  CHILLS -------- NO  HEADACHES ---------NO  LOSS OF SMELL/ TASTE --------NO   HAVE YOU OR ANY FAMILY MEMBER TRAVELLED PAST 14 DAYS OUT OF THE   COUNTY---NO STATE----NO COUNTRY----NO  HAVE YOU OR ANY FAMILY MEMBER BEEN EXPOSED TO ANYONE WITH COVID 19? NO

## 2019-03-20 NOTE — Progress Notes (Signed)
[name] assessed [name]'s airway due to her increased BMI.

## 2019-03-21 LAB — NOVEL CORONAVIRUS, NAA (HOSP ORDER, SEND-OUT TO REF LAB; TAT 18-24 HRS): SARS-CoV-2, NAA: NOT DETECTED

## 2019-03-21 NOTE — Progress Notes (Signed)
Anesthesia Chart Review   Case: [id number] Date/Time: 03/23/19 0715   Procedure: LAPAROSCOPIC CHOLECYSTECTOMY (N/A )   Anesthesia type: General   Pre-op diagnosis: biliary colic   Location: [facility] ROOM 04 / [facility]   Surgeon: Clovis Riley, MD      DISCUSSION: 53 yo never smoker with h/o HTN, asthma, depression, anxiety, bipolar I disorder, GERD, migraines, DM II, PFO s/p closure 5916, biliary colic scheduled for above procedure 03/23/2019 with Dr. Romana Juniper.   Last seen by PCP, [name], PA-C.   Evaluated by myself during PAT visit 03/20/2019.  Lungs clear to auscultation, no cough, shortness of breath.  Pt asymptomatic.    Anticipate pt can proceed with planned procedure barring acute status change.  VS: BP (!) 158/91   Pulse 96   Temp 36.9 C (Oral)   Resp 16   Ht 5' (1.524 m)   Wt 133.4 kg   SpO2 100%   BMI 57.42 kg/m   PROVIDERS: [name], PA is PCP    LABS: Labs reviewed: Acceptable for surgery. (all labs ordered are listed, but only abnormal results are displayed)  Labs Reviewed  GLUCOSE, CAPILLARY - Abnormal; Notable for the following components:      Result Value   Glucose-Capillary 122 (*)    All other components within normal limits  BASIC METABOLIC PANEL - Abnormal; Notable for the following components:   Glucose, Bld 108 (*)    All other components within normal limits  CBC - Abnormal; Notable for the following components:   RBC 3.82 (*)    Hemoglobin 9.5 (*)    HCT 32.5 (*)    MCH 24.9 (*)    MCHC 29.2 (*)    RDW 17.9 (*)    All other components within normal limits     IMAGES: 02/20/2019 US Abdomen  IMPRESSION: Cholelithiasis with gallbladder wall thickening and a negative sonographic Murphy's sign. This is relatively stable from the prior exam.  Prominent common bile duct without definitive stone. Correlation with laboratory values is recommended.  EKG: 09/12/18 Rate 98 bpm Sinus rhythm  RSR  (V1)-nondiagnostic  CV:  Past Medical History:  Diagnosis Date  . Anemia   . Anxiety   . Asthma   . Bipolar 1 disorder ([place])   . Depression   . Diabetes mellitus without complication ([place])   . GERD (gastroesophageal reflux disease)   . Heart murmur    HISTORY OF CLOSED IN 2006  . History of chicken pox   . History of Helicobacter pylori infection   . History of migraine   . Hypertension   . Obesity   . PFO (patent foramen ovale)   . Sleep apnea    does not use cpap  . Vitamin D deficiency     Past Surgical History:  Procedure Laterality Date  . BIOPSY  12/04/2018   Procedure: BIOPSY;  Surgeon: Thornton Park, MD;  [facility];  Service: Gastroenterology;;  EGD and Colon  . BREAST REDUCTION SURGERY  1986  . COLONOSCOPY WITH PROPOFOL N/A 12/04/2018   Procedure: COLONOSCOPY WITH PROPOFOL;  Surgeon: Thornton Park, MD;  [facility];  Service: Gastroenterology;  Laterality: N/A;  . ESOPHAGOGASTRODUODENOSCOPY (EGD) WITH PROPOFOL N/A 12/04/2018   Procedure: ESOPHAGOGASTRODUODENOSCOPY (EGD) WITH PROPOFOL;  Surgeon: Thornton Park, MD;  [facility];  Service: Gastroenterology;  Laterality: N/A;  . PATENT FORAMEN OVALE(PFO) CLOSURE  2006  . TONSILLECTOMY    . WISDOM TOOTH EXTRACTION      MEDICATIONS: . albuterol (  VENTOLIN HFA) 108 (90 Base) MCG/ACT inhaler  . ARIPiprazole (ABILIFY) 10 MG tablet  . Diclofenac Sodium (PENNSAID) 2 % SOLN  . ferrous sulfate 325 (65 FE) MG tablet  . lamoTRIgine (LAMICTAL) 25 MG tablet  . lisinopril-hydrochlorothiazide (ZESTORETIC) 20-25 MG tablet  . metFORMIN (GLUCOPHAGE) 1000 MG tablet  . ondansetron (ZOFRAN) 4 MG tablet  . oxyCODONE-acetaminophen (PERCOCET) 5-325 MG tablet  . pantoprazole (PROTONIX) 40 MG tablet  . traZODone (DESYREL) 50 MG tablet   No current facility-administered medications for this encounter.     Maia Plan [facility] Pre-Surgical Testing [phone] 03/21/19 3:00 PM

## 2019-03-22 MED ORDER — BUPIVACAINE LIPOSOME 1.3 % IJ SUSP
20.0000 mL | Freq: Once | INTRAMUSCULAR | Status: DC
  Filled 2019-03-22: qty 20

## 2019-03-22 MED ORDER — DEXTROSE 5 % IV SOLN
3.0000 g | INTRAVENOUS | Status: AC
  Administered 2019-03-23: 3 g via INTRAVENOUS
  Filled 2019-03-22: qty 3

## 2019-03-23 ENCOUNTER — Encounter (HOSPITAL_COMMUNITY)
Admission: RE | Disposition: A | Discharge status: Home / Self Care | Payer: Self-pay | Source: Other Acute Inpatient Hospital | Attending: Surgery

## 2019-03-23 ENCOUNTER — Encounter (HOSPITAL_COMMUNITY): Payer: Self-pay | Admitting: *Deleted

## 2019-03-23 ENCOUNTER — Ambulatory Visit (HOSPITAL_COMMUNITY): Payer: PPO | Admitting: Anesthesiology

## 2019-03-23 ENCOUNTER — Ambulatory Visit (HOSPITAL_COMMUNITY)
Admission: RE | Admit: 2019-03-23 | Discharge: 2019-03-23 | Disposition: A | Discharge status: Home / Self Care | Payer: PPO | Source: Other Acute Inpatient Hospital | Attending: Surgery | Admitting: Surgery

## 2019-03-23 ENCOUNTER — Ambulatory Visit (HOSPITAL_COMMUNITY): Payer: PPO | Admitting: Physician Assistant

## 2019-03-23 DIAGNOSIS — Z1159 Encounter for screening for other viral diseases: Secondary | ICD-10-CM | POA: Insufficient documentation

## 2019-03-23 DIAGNOSIS — K635 Polyp of colon: Secondary | ICD-10-CM | POA: Diagnosis not present

## 2019-03-23 DIAGNOSIS — Z6841 Body Mass Index (BMI) 40.0 and over, adult: Secondary | ICD-10-CM | POA: Diagnosis not present

## 2019-03-23 DIAGNOSIS — K8046 Calculus of bile duct with acute and chronic cholecystitis without obstruction: Secondary | ICD-10-CM | POA: Diagnosis not present

## 2019-03-23 DIAGNOSIS — E119 Type 2 diabetes mellitus without complications: Secondary | ICD-10-CM | POA: Insufficient documentation

## 2019-03-23 DIAGNOSIS — Z79899 Other long term (current) drug therapy: Secondary | ICD-10-CM | POA: Diagnosis not present

## 2019-03-23 DIAGNOSIS — K219 Gastro-esophageal reflux disease without esophagitis: Secondary | ICD-10-CM | POA: Insufficient documentation

## 2019-03-23 DIAGNOSIS — D649 Anemia, unspecified: Secondary | ICD-10-CM | POA: Insufficient documentation

## 2019-03-23 DIAGNOSIS — I10 Essential (primary) hypertension: Secondary | ICD-10-CM | POA: Diagnosis not present

## 2019-03-23 DIAGNOSIS — K8012 Calculus of gallbladder with acute and chronic cholecystitis without obstruction: Secondary | ICD-10-CM | POA: Diagnosis not present

## 2019-03-23 DIAGNOSIS — K8010 Calculus of gallbladder with chronic cholecystitis without obstruction: Secondary | ICD-10-CM | POA: Diagnosis not present

## 2019-03-23 DIAGNOSIS — Z7984 Long term (current) use of oral hypoglycemic drugs: Secondary | ICD-10-CM | POA: Insufficient documentation

## 2019-03-23 DIAGNOSIS — F319 Bipolar disorder, unspecified: Secondary | ICD-10-CM | POA: Insufficient documentation

## 2019-03-23 DIAGNOSIS — G4730 Sleep apnea, unspecified: Secondary | ICD-10-CM | POA: Insufficient documentation

## 2019-03-23 HISTORY — PX: CHOLECYSTECTOMY: SHX55

## 2019-03-23 LAB — GLUCOSE, CAPILLARY
Glucose-Capillary: 122 mg/dL — ABNORMAL HIGH (ref 70–99)
Glucose-Capillary: 168 mg/dL — ABNORMAL HIGH (ref 70–99)

## 2019-03-23 SURGERY — LAPAROSCOPIC CHOLECYSTECTOMY
Anesthesia: General

## 2019-03-23 MED ORDER — LACTATED RINGERS IV SOLN
INTRAVENOUS | Status: DC
  Administered 2019-03-23: 06:00:00 via INTRAVENOUS

## 2019-03-23 MED ORDER — LIDOCAINE 2% (20 MG/ML) 5 ML SYRINGE
INTRAMUSCULAR | Status: DC | PRN
  Administered 2019-03-23: 100 mg via INTRAVENOUS

## 2019-03-23 MED ORDER — PROPOFOL 10 MG/ML IV BOLUS
INTRAVENOUS | Status: AC
  Filled 2019-03-23: qty 20

## 2019-03-23 MED ORDER — 0.9 % SODIUM CHLORIDE (POUR BTL) OPTIME
TOPICAL | Status: DC | PRN
  Administered 2019-03-23: 1000 mL

## 2019-03-23 MED ORDER — DEXAMETHASONE SODIUM PHOSPHATE 10 MG/ML IJ SOLN
INTRAMUSCULAR | Status: DC | PRN
  Administered 2019-03-23: 5 mg via INTRAVENOUS

## 2019-03-23 MED ORDER — PROMETHAZINE HCL 25 MG/ML IJ SOLN: 6.2500 mg | INTRAMUSCULAR | Status: DC | PRN

## 2019-03-23 MED ORDER — GABAPENTIN 300 MG PO CAPS
300.0000 mg | ORAL_CAPSULE | ORAL | Status: AC
  Administered 2019-03-23: 300 mg via ORAL
  Filled 2019-03-23: qty 1

## 2019-03-23 MED ORDER — SODIUM CHLORIDE 0.9% FLUSH: 3.0000 mL | Freq: Two times a day (BID) | INTRAVENOUS | Status: DC

## 2019-03-23 MED ORDER — FENTANYL CITRATE (PF) 250 MCG/5ML IJ SOLN
INTRAMUSCULAR | Status: AC
  Filled 2019-03-23: qty 5

## 2019-03-23 MED ORDER — BUPIVACAINE-EPINEPHRINE (PF) 0.25% -1:200000 IJ SOLN
INTRAMUSCULAR | Status: AC
  Filled 2019-03-23: qty 30

## 2019-03-23 MED ORDER — MIDAZOLAM HCL 2 MG/2ML IJ SOLN
INTRAMUSCULAR | Status: AC
  Filled 2019-03-23: qty 2

## 2019-03-23 MED ORDER — ACETAMINOPHEN 650 MG RE SUPP
650.0000 mg | RECTAL | Status: DC | PRN
  Filled 2019-03-23: qty 1

## 2019-03-23 MED ORDER — SUGAMMADEX SODIUM 500 MG/5ML IV SOLN
INTRAVENOUS | Status: AC
  Filled 2019-03-23: qty 5

## 2019-03-23 MED ORDER — CHLORHEXIDINE GLUCONATE 4 % EX LIQD: 60.0000 mL | Freq: Once | CUTANEOUS | Status: DC

## 2019-03-23 MED ORDER — ACETAMINOPHEN 325 MG PO TABS: 650.0000 mg | ORAL_TABLET | ORAL | Status: DC | PRN

## 2019-03-23 MED ORDER — ONDANSETRON HCL 4 MG/2ML IJ SOLN
INTRAMUSCULAR | Status: DC | PRN
  Administered 2019-03-23: 4 mg via INTRAVENOUS

## 2019-03-23 MED ORDER — SUCCINYLCHOLINE CHLORIDE 200 MG/10ML IV SOSY
PREFILLED_SYRINGE | INTRAVENOUS | Status: AC
  Filled 2019-03-23: qty 10

## 2019-03-23 MED ORDER — LACTATED RINGERS IV SOLN
INTRAVENOUS | Status: DC | PRN
  Administered 2019-03-23 (×2): via INTRAVENOUS

## 2019-03-23 MED ORDER — MIDAZOLAM HCL 5 MG/5ML IJ SOLN
INTRAMUSCULAR | Status: DC | PRN
  Administered 2019-03-23: 2 mg via INTRAVENOUS

## 2019-03-23 MED ORDER — PHENYLEPHRINE 40 MCG/ML (10ML) SYRINGE FOR IV PUSH (FOR BLOOD PRESSURE SUPPORT)
PREFILLED_SYRINGE | INTRAVENOUS | Status: DC | PRN
  Administered 2019-03-23 (×3): 80 ug via INTRAVENOUS
  Administered 2019-03-23: 40 ug via INTRAVENOUS
  Administered 2019-03-23: 80 ug via INTRAVENOUS

## 2019-03-23 MED ORDER — OXYCODONE HCL 5 MG PO TABS: 5.0000 mg | ORAL_TABLET | Freq: Three times a day (TID) | ORAL | 0 refills | Status: AC | PRN

## 2019-03-23 MED ORDER — FENTANYL CITRATE (PF) 250 MCG/5ML IJ SOLN
INTRAMUSCULAR | Status: DC | PRN
  Administered 2019-03-23 (×2): 50 ug via INTRAVENOUS
  Administered 2019-03-23: 100 ug via INTRAVENOUS
  Administered 2019-03-23: 50 ug via INTRAVENOUS

## 2019-03-23 MED ORDER — OXYCODONE HCL 5 MG PO TABS: 5.0000 mg | ORAL_TABLET | ORAL | Status: DC | PRN

## 2019-03-23 MED ORDER — FENTANYL CITRATE (PF) 100 MCG/2ML IJ SOLN
25.0000 ug | INTRAMUSCULAR | Status: DC | PRN
  Administered 2019-03-23: 50 ug via INTRAVENOUS

## 2019-03-23 MED ORDER — PHENYLEPHRINE 40 MCG/ML (10ML) SYRINGE FOR IV PUSH (FOR BLOOD PRESSURE SUPPORT)
PREFILLED_SYRINGE | INTRAVENOUS | Status: AC
  Filled 2019-03-23: qty 10

## 2019-03-23 MED ORDER — DOCUSATE SODIUM 100 MG PO CAPS: 100.0000 mg | ORAL_CAPSULE | Freq: Two times a day (BID) | ORAL | 0 refills | Status: AC

## 2019-03-23 MED ORDER — LACTATED RINGERS IV SOLN
INTRAVENOUS | Status: AC | PRN
  Administered 2019-03-23: 1000 mL

## 2019-03-23 MED ORDER — ONDANSETRON HCL 4 MG/2ML IJ SOLN
INTRAMUSCULAR | Status: AC
  Filled 2019-03-23: qty 2

## 2019-03-23 MED ORDER — BUPIVACAINE-EPINEPHRINE 0.25% -1:200000 IJ SOLN
INTRAMUSCULAR | Status: DC | PRN
  Administered 2019-03-23: 30 mL

## 2019-03-23 MED ORDER — ACETAMINOPHEN 500 MG PO TABS
1000.0000 mg | ORAL_TABLET | ORAL | Status: AC
  Administered 2019-03-23: 1000 mg via ORAL
  Filled 2019-03-23: qty 2

## 2019-03-23 MED ORDER — PROPOFOL 10 MG/ML IV BOLUS
INTRAVENOUS | Status: DC | PRN
  Administered 2019-03-23: 200 mg via INTRAVENOUS

## 2019-03-23 MED ORDER — SUCCINYLCHOLINE CHLORIDE 200 MG/10ML IV SOSY
PREFILLED_SYRINGE | INTRAVENOUS | Status: DC | PRN
  Administered 2019-03-23: 160 mg via INTRAVENOUS

## 2019-03-23 MED ORDER — HYDROMORPHONE HCL 1 MG/ML IJ SOLN: 0.2500 mg | INTRAMUSCULAR | Status: DC | PRN

## 2019-03-23 MED ORDER — ROCURONIUM BROMIDE 10 MG/ML (PF) SYRINGE
PREFILLED_SYRINGE | INTRAVENOUS | Status: AC
  Filled 2019-03-23: qty 10

## 2019-03-23 MED ORDER — ROCURONIUM BROMIDE 10 MG/ML (PF) SYRINGE
PREFILLED_SYRINGE | INTRAVENOUS | Status: DC | PRN
  Administered 2019-03-23: 40 mg via INTRAVENOUS
  Administered 2019-03-23: 10 mg via INTRAVENOUS

## 2019-03-23 MED ORDER — KETOROLAC TROMETHAMINE 30 MG/ML IJ SOLN
30.0000 mg | Freq: Once | INTRAMUSCULAR | Status: AC | PRN
  Administered 2019-03-23: 30 mg via INTRAVENOUS

## 2019-03-23 MED ORDER — SODIUM CHLORIDE 0.9% FLUSH: 3.0000 mL | INTRAVENOUS | Status: DC | PRN

## 2019-03-23 MED ORDER — DEXAMETHASONE SODIUM PHOSPHATE 10 MG/ML IJ SOLN
INTRAMUSCULAR | Status: AC
  Filled 2019-03-23: qty 1

## 2019-03-23 MED ORDER — SUGAMMADEX SODIUM 500 MG/5ML IV SOLN
INTRAVENOUS | Status: DC | PRN
  Administered 2019-03-23: 300 mg via INTRAVENOUS

## 2019-03-23 MED ORDER — SODIUM CHLORIDE 0.9 % IV SOLN: 250.0000 mL | INTRAVENOUS | Status: DC | PRN

## 2019-03-23 MED ORDER — LIDOCAINE 2% (20 MG/ML) 5 ML SYRINGE
INTRAMUSCULAR | Status: AC
  Filled 2019-03-23: qty 5

## 2019-03-23 SURGICAL SUPPLY — 33 items
APPLIER CLIP ROT 10 11.4 M/L (STAPLE) ×3
CABLE HIGH FREQUENCY MONO STRZ (ELECTRODE) ×3 IMPLANT
CHLORAPREP W/TINT 26 (MISCELLANEOUS) ×3 IMPLANT
CLIP APPLIE ROT 10 11.4 M/L (STAPLE) ×1 IMPLANT
COVER MAYO STAND STRL (DRAPES) IMPLANT
COVER SURGICAL LIGHT HANDLE (MISCELLANEOUS) ×3 IMPLANT
COVER WAND RF STERILE (DRAPES) IMPLANT
DECANTER SPIKE VIAL GLASS SM (MISCELLANEOUS) ×3 IMPLANT
DERMABOND ADVANCED (GAUZE/BANDAGES/DRESSINGS) ×2
DERMABOND ADVANCED .7 DNX12 (GAUZE/BANDAGES/DRESSINGS) ×1 IMPLANT
DRAPE C-ARM 42X120 X-RAY (DRAPES) IMPLANT
ELECT REM PT RETURN 15FT ADLT (MISCELLANEOUS) ×3 IMPLANT
GLOVE BIO SURGEON STRL SZ 6 (GLOVE) ×3 IMPLANT
GLOVE INDICATOR 6.5 STRL GRN (GLOVE) ×3 IMPLANT
GOWN STRL REUS W/TWL LRG LVL3 (GOWN DISPOSABLE) ×3 IMPLANT
GOWN STRL REUS W/TWL XL LVL3 (GOWN DISPOSABLE) ×6 IMPLANT
GRASPER SUT TROCAR 14GX15 (MISCELLANEOUS) ×3 IMPLANT
HEMOSTAT SNOW SURGICEL 2X4 (HEMOSTASIS) ×3 IMPLANT
KIT BASIN OR (CUSTOM PROCEDURE TRAY) ×3 IMPLANT
KIT TURNOVER KIT A (KITS) IMPLANT
NEEDLE INSUFFLATION 14GA 120MM (NEEDLE) ×3 IMPLANT
POUCH SPECIMEN RETRIEVAL 10MM (ENDOMECHANICALS) ×3 IMPLANT
SCISSORS LAP 5X35 DISP (ENDOMECHANICALS) ×3 IMPLANT
SET CHOLANGIOGRAPH MIX (MISCELLANEOUS) IMPLANT
SET IRRIG TUBING LAPAROSCOPIC (IRRIGATION / IRRIGATOR) ×3 IMPLANT
SET TUBE SMOKE EVAC HIGH FLOW (TUBING) ×3 IMPLANT
SLEEVE XCEL OPT CAN 5 100 (ENDOMECHANICALS) ×9 IMPLANT
SUT MNCRL AB 4-0 PS2 18 (SUTURE) ×3 IMPLANT
TOWEL OR 17X26 10 PK STRL BLUE (TOWEL DISPOSABLE) ×3 IMPLANT
TOWEL OR NON WOVEN STRL DISP B (DISPOSABLE) IMPLANT
TRAY LAPAROSCOPIC (CUSTOM PROCEDURE TRAY) ×3 IMPLANT
TROCAR BLADELESS OPT 5 100 (ENDOMECHANICALS) ×3 IMPLANT
TROCAR XCEL 12X100 BLDLESS (ENDOMECHANICALS) ×3 IMPLANT

## 2019-03-23 NOTE — Transfer of Care (Signed)
Immediate Anesthesia Transfer of Care Note  Patient: [name]  Procedure(s) Performed: LAPAROSCOPIC CHOLECYSTECTOMY (N/A )  Patient Location: PACU  Anesthesia Type:General  Level of Consciousness: awake, alert , oriented and patient cooperative  Airway & Oxygen Therapy: Patient Spontanous Breathing and Patient connected to face mask oxygen  Post-op Assessment: Report given to RN and Post -op Vital signs reviewed and stable  Post vital signs: Reviewed and stable  Last Vitals:  Vitals Value Taken Time  BP    Temp    Pulse    Resp    SpO2      Last Pain:  Vitals:   03/23/19 0637  TempSrc:   PainSc: 0-No pain      Patients Stated Pain Goal: 3 (94/17/40 8144)  Complications: No apparent anesthesia complications

## 2019-03-23 NOTE — Anesthesia Preprocedure Evaluation (Signed)
Anesthesia Evaluation  Patient identified by MRN, date of birth, ID band Patient awake    Reviewed: Allergy & Precautions, NPO status , Patient's Chart, lab work & pertinent test results  Airway Mallampati: III  TM Distance: <3 FB Neck ROM: Full    Dental no notable dental hx.    Pulmonary neg pulmonary ROS,    Pulmonary exam normal breath sounds clear to auscultation       Cardiovascular hypertension, Normal cardiovascular exam Rhythm:Regular Rate:Normal     Neuro/Psych negative neurological ROS  negative psych ROS   GI/Hepatic negative GI ROS, Neg liver ROS,   Endo/Other  diabetesMorbid obesity  Renal/GU negative Renal ROS  negative genitourinary   Musculoskeletal negative musculoskeletal ROS (+)   Abdominal   Peds negative pediatric ROS (+)  Hematology  (+) anemia ,   Anesthesia Other Findings   Reproductive/Obstetrics negative OB ROS                             Anesthesia Physical Anesthesia Plan  ASA: III  Anesthesia Plan: General   Post-op Pain Management:    Induction: Intravenous  PONV Risk Score and Plan: 3 and Ondansetron, Dexamethasone, Midazolam and Treatment may vary due to age or medical condition  Airway Management Planned: Oral ETT and Video Laryngoscope Planned  Additional Equipment:   Intra-op Plan:   Post-operative Plan: Extubation in OR  Informed Consent: I have reviewed the patients History and Physical, chart, labs and discussed the procedure including the risks, benefits and alternatives for the proposed anesthesia with the patient or authorized representative who has indicated his/her understanding and acceptance.     Dental advisory given  Plan Discussed with: CRNA and Surgeon  Anesthesia Plan Comments:         Anesthesia Quick Evaluation

## 2019-03-23 NOTE — Anesthesia Procedure Notes (Signed)
Procedure Name: Intubation Date/Time: 03/23/2019 7:28 AM Performed by: Lollie Sails, CRNA Pre-anesthesia Checklist: Patient identified, Emergency Drugs available, Suction available, Patient being monitored and Timeout performed Patient Re-evaluated:Patient Re-evaluated prior to induction Oxygen Delivery Method: Circle system utilized Preoxygenation: Pre-oxygenation with 100% oxygen Induction Type: IV induction and Rapid sequence Laryngoscope Size: Miller and 3 Grade View: Grade I Tube type: Oral Tube size: 7.0 mm Number of attempts: 1 Airway Equipment and Method: Stylet Placement Confirmation: ETT inserted through vocal cords under direct vision,  positive ETCO2 and breath sounds checked- equal and bilateral Secured at: 22 cm Tube secured with: Tape Dental Injury: Teeth and Oropharynx as per pre-operative assessment

## 2019-03-23 NOTE — Anesthesia Postprocedure Evaluation (Signed)
Anesthesia Post Note  Patient: [name]  Procedure(s) Performed: LAPAROSCOPIC CHOLECYSTECTOMY (N/A )     Patient location during evaluation: PACU Anesthesia Type: General Level of consciousness: awake and alert Pain management: pain level controlled Vital Signs Assessment: post-procedure vital signs reviewed and stable Respiratory status: spontaneous breathing, nonlabored ventilation, respiratory function stable and patient connected to nasal cannula oxygen Cardiovascular status: blood pressure returned to baseline and stable Postop Assessment: no apparent nausea or vomiting Anesthetic complications: no    Last Vitals:  Vitals:   03/23/19 0945 03/23/19 1000  BP: 114/75 129/74  Pulse: 94 (!) 106  Resp: 14 16  Temp:  36.9 C  SpO2: 100% 100%    Last Pain:  Vitals:   03/23/19 1000  TempSrc:   PainSc: 2                  [name]

## 2019-03-23 NOTE — Op Note (Signed)
Operative Note  [name] 53 y.o. female [id number]  03/23/2019  Surgeon: Clovis Riley MD FACS  Assistant: Greer Pickerel FACS  Procedure performed: Laparoscopic Cholecystectomy  Procedure classification: elective/ semi-urgent  Preop diagnosis: biliary colic Post-op diagnosis/intraop findings: acute on chronic cholecystitis  Specimens: gallbladder  Retained items: none  EBL: minimal  Complications: none  Description of procedure: After obtaining informed consent the patient was brought to the operating room. Antibiotics were administered. SCD's were applied. General endotracheal anesthesia was initiated and a formal time-out was performed. The abdomen was prepped and draped in the usual sterile fashion and the abdomen was entered using visiport technique in the left upper quadrant after instilling the site with local. Insufflation to 60mmHg was obtained and gross inspection revealed no evidence of injury from our entry or other intraabdominal abnormalities. Three 63mm trocars were introduced in the supraumbilical, right midclavicular and right anterior axillary lines under direct visualization and following infiltration with local. An 67mm trocar was placed in the epigastrium.   The gallbladder was white and contracted, encased in omental adhesions and the duodenum was drawn up towards and adherent to the infundibulum the body of the gallbladder.  With careful blunt dissection as well as cautery where appropriate, these adhesions were meticulously taken down ensuring no injury to the duodenum.  Once we had exposed the infundibulum this was retracted laterally while the fundus was retracted cephalad.  Further woody fibrotic adhesions were present along the cystic duct and cystic triangle requiring careful dissection which was done with the hook in translucent layers.  A combination of hook electrocautery and blunt dissection was utilized to clear the peritoneum from the neck and cystic  duct, circumferentially isolating the cystic artery and cystic duct and lifting the gallbladder from the cystic plate.  The cystic duct is quite diminutive.  There was a posterior branch artery which was dissected out as well.  The critical view of safety was achieved with the cystic artery, cystic duct, and liver bed visualized between them with no other structures. The anterior and posterior cystic artery branches were clipped with a single clip proximally and distally and divided as was the cystic duct with two clips on the proximal end. The gallbladder was dissected from the liver plate using electrocautery. Once freed the gallbladder was placed in an endocatch bag and removed through the epigastric trocar site. A small amount of bleeding on the liver bed was controlled with cautery.  The right upper quadrant was aspirated and the right upper quadrant was irrigated copiously; the effluent was clear. Hemostasis was once again confirmed, and reinspection of the abdomen revealed no injuries. The clips were well opposed without any bile leak from the duct or the liver bed.  A piece of Surgicel snow was placed in the liver bed. The 43mm trocar site in the epigastrium was closed with a 0 vicryl in the fascia under direct visualization using a PMI device. The abdomen was desufflated and all trocars removed. The skin incisions were closed with running subcuticular monocryl and Dermabond. The patient was awakened, extubated and transported to the recovery room in stable condition.    All counts were correct at the completion of the case.

## 2019-03-23 NOTE — Interval H&P Note (Signed)
History and Physical Interval Note:  03/23/2019 7:07 AM  [name]  has presented today for surgery, with the diagnosis of biliary colic.  The various methods of treatment have been discussed with the patient and family. After consideration of risks, benefits and other options for treatment, the patient has consented to  Procedure(s): LAPAROSCOPIC CHOLECYSTECTOMY (N/A) as a surgical intervention.  The patient's history has been reviewed, patient examined, no change in status, stable for surgery.  I have reviewed the patient's chart and labs.  Questions were answered to the patient's satisfaction.     Grabiel Schmutz Rich Brave

## 2019-03-23 NOTE — Discharge Instructions (Signed)
LAPAROSCOPIC SURGERY: POST OP INSTRUCTIONS  ######################################################################  EAT Gradually transition to a high fiber diet with a fiber supplement over the next few weeks after discharge.  Start with a pureed / full liquid diet (see below)  WALK Walk an hour a day.  Control your pain to do that.    CONTROL PAIN Control pain so that you can walk, sleep, tolerate sneezing/coughing, go up/down stairs.  HAVE A BOWEL MOVEMENT DAILY Keep your bowels regular to avoid problems.  OK to try a laxative to override constipation.  OK to use an antidairrheal to slow down diarrhea.  Call if not better after 2 tries  CALL IF YOU HAVE PROBLEMS/CONCERNS Call if you are still struggling despite following these instructions. Call if you have concerns not answered by these instructions  ######################################################################    1. DIET: Follow a light bland diet & liquids the first 24 hours after arrival home, such as soup, liquids, starches, etc.  Be sure to drink plenty of fluids.  Quickly advance to a usual solid diet within a few days.  Avoid fast food or heavy meals as your are more likely to get nauseated or have irregular bowels.  A low-fat, high-fiber diet for the rest of your life is ideal.  2. Take your usually prescribed home medications unless otherwise directed.  3. PAIN CONTROL: a. Pain is best controlled by a usual combination of three different methods TOGETHER: i. Ice/Heat ii. Over the counter pain medication iii. Prescription pain medication b. Most patients will experience some swelling and bruising around the incisions.  Ice packs or heating pads (30-60 minutes up to 6 times a day) will help. Use ice for the first few days to help decrease swelling and bruising, then switch to heat to help relax tight/sore spots and speed recovery.  Some people prefer to use ice alone, heat alone, alternating between ice & heat.   Experiment to what works for you.  Swelling and bruising can take several weeks to resolve.   c. It is helpful to take an over-the-counter pain medication regularly for the first few weeks.  Choose one of the following that works best for you: i. Naproxen (Aleve, etc)  Two 237m tabs twice a day ii. Ibuprofen (Advil, etc) Three 2013mtabs four times a day (every meal & bedtime) iii. Acetaminophen (Tylenol, etc) 500-6508mour times a day (every meal & bedtime) d. A  prescription for pain medication (such as oxycodone, hydrocodone, tramadol, gabapentin, methocarbamol, etc) should be given to you upon discharge.  Take your pain medication as prescribed.  i. If you are having problems/concerns with the prescription medicine (does not control pain, nausea, vomiting, rash, itching, etc), please call us [phone] see if we need to switch you to a different pain medicine that will work better for you and/or control your side effect better. ii. If you need a refill on your pain medication, please give us [place] hour notice.  contact your pharmacy.  They will contact our office to request authorization. Prescriptions will not be filled after 5 pm or on week-ends  4. Avoid getting constipated.   a. Between the surgery and the pain medications, it is common to experience some constipation.   b. Increasing fluid intake and taking a fiber supplement (such as Metamucil, Citrucel, FiberCon, MiraLax, etc) 1-2 times a day regularly will usually help prevent this problem from occurring.   c. A mild laxative (prune juice, Milk of Magnesia, MiraLax, etc) should be taken according to  package directions if there are no bowel movements after 48 hours.   5. Watch out for diarrhea.   a. If you have many loose bowel movements, simplify your diet to bland foods & liquids for a few days.   b. Stop any stool softeners and decrease your fiber supplement.   c. Switching to mild anti-diarrheal medications (Kayopectate, Pepto  Bismol) can help.   d. If this worsens or does not improve, please call us.  6. Wash / shower every day.  You may shower over the skin glue which is waterproof.  Continue to shower over incision(s) after the dressing is off.  7. Skin glue will flake off after 2 weeks.  You may leave the incision open to air.  You may replace a dressing/Band-Aid to cover the incision for comfort if you wish.   8. ACTIVITIES as tolerated:   a. You may resume regular (light) daily activities beginning the next day--such as daily self-care, walking, climbing stairs--gradually increasing activities as tolerated.  If you can walk 30 minutes without difficulty, it is safe to try more intense activity such as jogging, treadmill, bicycling, low-impact aerobics, swimming, etc. b. Save the most intensive and strenuous activity for last such as sit-ups, heavy lifting, contact sports, etc  Refrain from any heavy lifting or straining until you are off narcotics for pain control.   c. DO NOT PUSH THROUGH PAIN.  Let pain be your guide: If it hurts to do something, don't do it.  Pain is your body warning you to avoid that activity for another week until the pain goes down. d. You may drive when you are no longer taking prescription pain medication, you can comfortably wear a seatbelt, and you can safely maneuver your car and apply brakes. e. Dennis Bast may have sexual intercourse when it is comfortable.  9. FOLLOW UP in our office a. Please call [facility] at [phone] to set up an appointment to see your surgeon in the office for a follow-up appointment approximately 2-3 weeks after your surgery. b. Make sure that you call for this appointment the day you arrive home to insure a convenient appointment time.  10. IF YOU HAVE DISABILITY OR FAMILY LEAVE FORMS, BRING THEM TO THE OFFICE FOR PROCESSING.  DO NOT GIVE THEM TO YOUR DOCTOR.   WHEN TO CALL us [phone]: 1. Poor pain control 2. Reactions / problems with new medications  (rash/itching, nausea, etc)  3. Fever over 101.5 F (38.5 C) 4. Inability to urinate 5. Nausea and/or vomiting 6. Worsening swelling or bruising 7. Continued bleeding from incision. 8. Increased pain, redness, or drainage from the incision   The clinic staff is available to answer your questions during regular business hours (8:30am-5pm).  Please dont hesitate to call and ask to speak to one of our nurses for clinical concerns.   If you have a medical emergency, go to the nearest emergency room or call 911.  A surgeon from [facility] is always on call at the [facility], [place], [place], [place], [place]  [address] ? MAIN: [phone] ? TOLL FREE: [phone] ?  FAX [phone] [email or web address]

## 2019-03-26 ENCOUNTER — Encounter (HOSPITAL_COMMUNITY): Payer: Self-pay | Admitting: Surgery

## 2019-03-27 DIAGNOSIS — F5101 Primary insomnia: Secondary | ICD-10-CM | POA: Diagnosis not present

## 2019-03-27 DIAGNOSIS — F3132 Bipolar disorder, current episode depressed, moderate: Secondary | ICD-10-CM | POA: Diagnosis not present

## 2019-05-22 ENCOUNTER — Other Ambulatory Visit: Payer: Self-pay | Admitting: Physician Assistant

## 2019-06-16 ENCOUNTER — Other Ambulatory Visit: Payer: Self-pay | Admitting: Physician Assistant

## 2019-06-27 DIAGNOSIS — F5101 Primary insomnia: Secondary | ICD-10-CM | POA: Diagnosis not present

## 2019-06-27 DIAGNOSIS — F3132 Bipolar disorder, current episode depressed, moderate: Secondary | ICD-10-CM | POA: Diagnosis not present

## 2019-07-30 ENCOUNTER — Other Ambulatory Visit: Payer: Self-pay

## 2019-07-30 ENCOUNTER — Ambulatory Visit (INDEPENDENT_AMBULATORY_CARE_PROVIDER_SITE_OTHER): Payer: PPO

## 2019-07-30 VITALS — Ht 61.0 in | Wt 270.0 lb

## 2019-07-30 DIAGNOSIS — Z0000 Encounter for general adult medical examination without abnormal findings: Secondary | ICD-10-CM | POA: Diagnosis not present

## 2019-07-30 DIAGNOSIS — Z1231 Encounter for screening mammogram for malignant neoplasm of breast: Secondary | ICD-10-CM | POA: Diagnosis not present

## 2019-07-30 NOTE — Patient Instructions (Signed)
[name] , Thank you for taking time to come for your Medicare Wellness Visit. I appreciate your ongoing commitment to your health goals. Please review the following plan we discussed and let me know if I can assist you in the future.   Screening recommendations/referrals: Colorectal Screening: up to date; colonoscopy 12/04/18 with Dr. Tarri Glenn Mammogram: ordered today; please call [phone] to schedule  Bone Density: not until age 53  Vision and Dental Exams: Recommended annual ophthalmology exams for early detection of glaucoma and other disorders of the eye Recommended annual dental exams for proper oral hygiene  Vaccinations: Influenza vaccine:  recommended this fall either at PCP office or through your local pharmacy  Pneumococcal vaccine: at age 47 Tdap vaccine: up to date; last 05/12/18  Shingles vaccine: Please call your insurance company to determine your out of pocket expense for the Shingrix vaccine. You may receive this vaccine at your local pharmacy.  Advanced directives: I have provided a copy for you to complete at home and have notarized. Once this is complete please bring a copy in to our office so we can scan it into your chart.  Goals: Recommend to drink at least 6-8 8oz glasses of water per day and consume a balanced diet rich in fresh fruits and vegetables.   Next appointment: Please schedule your Annual Wellness Visit with your Nurse Health Advisor in one year.  Preventive Care 40-64 Years, Female Preventive care refers to lifestyle choices and visits with your health care provider that can promote health and wellness. What does preventive care include?  A yearly physical exam. This is also called an annual well check.  Dental exams once or twice a year.  Routine eye exams. Ask your health care provider how often you should have your eyes checked.  Personal lifestyle choices, including:  Daily care of your teeth and gums.  Regular physical activity.  Eating  a healthy diet.  Avoiding tobacco and drug use.  Limiting alcohol use.  Practicing safe sex.  Taking low-dose aspirin daily starting at age 61 if recommended by your health care provider.  Taking vitamin and mineral supplements as recommended by your health care provider. What happens during an annual well check? The services and screenings done by your health care provider during your annual well check will depend on your age, overall health, lifestyle risk factors, and family history of disease. Counseling  Your health care provider may ask you questions about your:  Alcohol use.  Tobacco use.  Drug use.  Emotional well-being.  Home and relationship well-being.  Sexual activity.  Eating habits.  Work and work Statistician.  Method of birth control.  Menstrual cycle.  Pregnancy history. Screening  You may have the following tests or measurements:  Height, weight, and BMI.  Blood pressure.  Lipid and cholesterol levels. These may be checked every 5 years, or more frequently if you are over 45 years old.  Skin check.  Lung cancer screening. You may have this screening every year starting at age 38 if you have a 30-pack-year history of smoking and currently smoke or have quit within the past 15 years.  Fecal occult blood test (FOBT) of the stool. You may have this test every year starting at age 25.  Flexible sigmoidoscopy or colonoscopy. You may have a sigmoidoscopy every 5 years or a colonoscopy every 10 years starting at age 74.  Hepatitis C blood test.  Hepatitis B blood test.  Sexually transmitted disease (STD) testing.  Diabetes screening. This  is done by checking your blood sugar (glucose) after you have not eaten for a while (fasting). You may have this done every 1-3 years.  Mammogram. This may be done every 1-2 years. Talk to your health care provider about when you should start having regular mammograms. This may depend on whether you have a  family history of breast cancer.  BRCA-related cancer screening. This may be done if you have a family history of breast, ovarian, tubal, or peritoneal cancers.  Pelvic exam and Pap test. This may be done every 3 years starting at age 73. Starting at age 68, this may be done every 5 years if you have a Pap test in combination with an HPV test.  Bone density scan. This is done to screen for osteoporosis. You may have this scan if you are at high risk for osteoporosis. Discuss your test results, treatment options, and if necessary, the need for more tests with your health care provider. Vaccines  Your health care provider may recommend certain vaccines, such as:  Influenza vaccine. This is recommended every year.  Tetanus, diphtheria, and acellular pertussis (Tdap, Td) vaccine. You may need a Td booster every 10 years.  Zoster vaccine. You may need this after age 77.  Pneumococcal 13-valent conjugate (PCV13) vaccine. You may need this if you have certain conditions and were not previously vaccinated.  Pneumococcal polysaccharide (PPSV23) vaccine. You may need one or two doses if you smoke cigarettes or if you have certain conditions. Talk to your health care provider about which screenings and vaccines you need and how often you need them. This information is not intended to replace advice given to you by your health care provider. Make sure you discuss any questions you have with your health care provider. Document Released: 10/17/2015 Document Revised: 06/09/2016 Document Reviewed: 07/22/2015 Elsevier Interactive Patient Education  2017 [employer] Prevention in the Home Falls can cause injuries. They can happen to people of all ages. There are many things you can do to make your home safe and to help prevent falls. What can I do on the outside of my home?  Regularly fix the edges of walkways and driveways and fix any cracks.  Remove anything that might make you trip as  you walk through a door, such as a raised step or threshold.  Trim any bushes or trees on the path to your home.  Use bright outdoor lighting.  Clear any walking paths of anything that might make someone trip, such as rocks or tools.  Regularly check to see if handrails are loose or broken. Make sure that both sides of any steps have handrails.  Any raised decks and porches should have guardrails on the edges.  Have any leaves, snow, or ice cleared regularly.  Use sand or salt on walking paths during winter.  Clean up any spills in your garage right away. This includes oil or grease spills. What can I do in the bathroom?  Use night lights.  Install grab bars by the toilet and in the tub and shower. Do not use towel bars as grab bars.  Use non-skid mats or decals in the tub or shower.  If you need to sit down in the shower, use a plastic, non-slip stool.  Keep the floor dry. Clean up any water that spills on the floor as soon as it happens.  Remove soap buildup in the tub or shower regularly.  Attach bath mats securely with double-sided non-slip  rug tape.  Do not have throw rugs and other things on the floor that can make you trip. What can I do in the bedroom?  Use night lights.  Make sure that you have a light by your bed that is easy to reach.  Do not use any sheets or blankets that are too big for your bed. They should not hang down onto the floor.  Have a firm chair that has side arms. You can use this for support while you get dressed.  Do not have throw rugs and other things on the floor that can make you trip. What can I do in the kitchen?  Clean up any spills right away.  Avoid walking on wet floors.  Keep items that you use a lot in easy-to-reach places.  If you need to reach something above you, use a strong step stool that has a grab bar.  Keep electrical cords out of the way.  Do not use floor polish or wax that makes floors slippery. If you must  use wax, use non-skid floor wax.  Do not have throw rugs and other things on the floor that can make you trip. What can I do with my stairs?  Do not leave any items on the stairs.  Make sure that there are handrails on both sides of the stairs and use them. Fix handrails that are broken or loose. Make sure that handrails are as long as the stairways.  Check any carpeting to make sure that it is firmly attached to the stairs. Fix any carpet that is loose or worn.  Avoid having throw rugs at the top or bottom of the stairs. If you do have throw rugs, attach them to the floor with carpet tape.  Make sure that you have a light switch at the top of the stairs and the bottom of the stairs. If you do not have them, ask someone to add them for you. What else can I do to help prevent falls?  Wear shoes that:  Do not have high heels.  Have rubber bottoms.  Are comfortable and fit you well.  Are closed at the toe. Do not wear sandals.  If you use a stepladder:  Make sure that it is fully opened. Do not climb a closed stepladder.  Make sure that both sides of the stepladder are locked into place.  Ask someone to hold it for you, if possible.  Clearly mark and make sure that you can see:  Any grab bars or handrails.  First and last steps.  Where the edge of each step is.  Use tools that help you move around (mobility aids) if they are needed. These include:  Canes.  Walkers.  Scooters.  Crutches.  Turn on the lights when you go into a dark area. Replace any light bulbs as soon as they burn out.  Set up your furniture so you have a clear path. Avoid moving your furniture around.  If any of your floors are uneven, fix them.  If there are any pets around you, be aware of where they are.  Review your medicines with your doctor. Some medicines can make you feel dizzy. This can increase your chance of falling. Ask your doctor what other things that you can do to help prevent  falls. This information is not intended to replace advice given to you by your health care provider. Make sure you discuss any questions you have with your health care provider. Document Released:  07/17/2009 Document Revised: 02/26/2016 Document Reviewed: 10/25/2014 Elsevier Interactive Patient Education  2017 [employer].

## 2019-07-30 NOTE — Progress Notes (Signed)
I connected with [name] on 07/30/19 at 1030 by a video enabled telemedicine application and verified that I am speaking with the correct person using two identifiers. Location patient: Home Location provider: [facility], Office Persons participating in the virtual visit: Denman George LPN and Dr. Orma Flaming    I discussed the limitations of evaluation and management by telemedicine and the availability of in person appointments. The patient expressed understanding and agreed to proceed.  Subjective:   [name] is a 53 y.o. female who presents for an Initial Medicare Annual Wellness Visit.  Review of Systems     Cardiac Risk Factors include: obesity (BMI >30kg/m2);hypertension    Objective:    Today's Vitals   07/30/19 1238  Weight: 270 lb (122.5 kg)  Height: 5\' 1"  (1.549 m)   Body mass index is 51.02 kg/m.  Advanced Directives 07/30/2019 03/20/2019 02/20/2019 12/04/2018 07/06/2018 03/31/2017  Does Patient Have a Medical Advance Directive? No No No No No Yes  Type of Advance Directive - - - - - Living will  Would patient like information on creating a medical advance directive? Yes (MAU/Ambulatory/Procedural Areas - Information given) No - Patient declined No - Patient declined No - Patient declined Yes (MAU/Ambulatory/Procedural Areas - Information given) -    Current Medications (verified) Outpatient Encounter Medications as of 07/30/2019  Medication Sig  . albuterol (VENTOLIN HFA) 108 (90 Base) MCG/ACT inhaler Inhale 1-2 puffs into the lungs every 6 (six) hours as needed for wheezing or shortness of breath.  . ARIPiprazole (ABILIFY) 10 MG tablet Take 10 mg by mouth daily.   . Diclofenac Sodium (PENNSAID) 2 % SOLN Place 1 application onto the skin 2 (two) times daily. (Patient taking differently: Place 1 application onto the skin daily as needed (pain). )  . ferrous sulfate 325 (65 FE) MG tablet Take 325 mg by mouth daily with breakfast.  . lamoTRIgine (LAMICTAL) 25  MG tablet Take 25 mg by mouth daily.   Marland Kitchen lisinopril-hydrochlorothiazide (ZESTORETIC) 20-25 MG tablet TAKE 1 TABLET BY MOUTH EVERY DAY  . metFORMIN (GLUCOPHAGE) 1000 MG tablet Take 1 tablet (1,000 mg total) by mouth daily with breakfast.  . pantoprazole (PROTONIX) 40 MG tablet Take 1 tablet (40 mg total) by mouth daily.  . traZODone (DESYREL) 50 MG tablet Take 50 mg by mouth at bedtime.  Marland Kitchen oxyCODONE (ROXICODONE) 5 MG immediate release tablet Take 1 tablet (5 mg total) by mouth every 8 (eight) hours as needed. Alternate tylenol and ibuprofen for the first few days. Take narcotic pain medication only if needed for severe/ breakthrough pain. (Patient not taking: Reported on 07/30/2019)   No facility-administered encounter medications on file as of 07/30/2019.     Allergies (verified) Patient has no known allergies.   History: Past Medical History:  Diagnosis Date  . Anemia   . Anxiety   . Asthma   . Bipolar 1 disorder ([place])   . Depression   . Diabetes mellitus without complication ([place])   . Dyspnea 2020   with exersion  . GERD (gastroesophageal reflux disease)   . Heart murmur    HISTORY OF CLOSED IN 2006  . History of chicken pox   . History of Helicobacter pylori infection   . History of migraine   . Hypertension   . Obesity   . PFO (patent foramen ovale)   . Sleep apnea    does not use cpap  . Vitamin D deficiency    Past Surgical History:  Procedure Laterality  Date  . BIOPSY  12/04/2018   Procedure: BIOPSY;  Surgeon: Thornton Park, MD;  Location: [facility];  Service: Gastroenterology;;  EGD and Colon  . BREAST REDUCTION SURGERY  1986  . CHOLECYSTECTOMY N/A 03/23/2019   Procedure: LAPAROSCOPIC CHOLECYSTECTOMY;  Surgeon: Clovis Riley, MD;  Location: [facility];  Service: General;  Laterality: N/A;  . COLONOSCOPY WITH PROPOFOL N/A 12/04/2018   Procedure: COLONOSCOPY WITH PROPOFOL;  Surgeon: Thornton Park, MD;  Location: [facility];  Service: Gastroenterology;   Laterality: N/A;  . ESOPHAGOGASTRODUODENOSCOPY (EGD) WITH PROPOFOL N/A 12/04/2018   Procedure: ESOPHAGOGASTRODUODENOSCOPY (EGD) WITH PROPOFOL;  Surgeon: Thornton Park, MD;  Location: [facility];  Service: Gastroenterology;  Laterality: N/A;  . PATENT FORAMEN OVALE(PFO) CLOSURE  2006  . TONSILLECTOMY    . WISDOM TOOTH EXTRACTION     Family History  Problem Relation Age of Onset  . Diabetes Mother   . Hypertension Mother   . Stroke Father   . Diabetes Maternal Grandmother   . CVA Maternal Grandmother   . Diabetes Maternal Grandfather   . Colon cancer Paternal Grandmother   . Diabetes Paternal Grandfather   . Breast cancer Maternal Aunt   . Asthma Other   . Cancer Other   . COPD Other   . Esophageal cancer Neg Hx   . Rectal cancer Neg Hx   . Stomach cancer Neg Hx   . Liver disease Neg Hx    Social History   Socioeconomic History  . Marital status: Single    Spouse name: Not on file  . Number of children: Not on file  . Years of education: Not on file  . Highest education level: Not on file  Occupational History  . Not on file  Social Needs  . Financial resource strain: Not on file  . Food insecurity    Worry: Not on file    Inability: Not on file  . Transportation needs    Medical: Not on file    Non-medical: Not on file  Tobacco Use  . Smoking status: Never Smoker  . Smokeless tobacco: Never Used  Substance and Sexual Activity  . Alcohol use: Yes    Alcohol/week: 1.0 - 2.0 standard drinks    Types: 1 - 2 Glasses of wine per week  . Drug use: No  . Sexual activity: Never    Birth control/protection: None  Lifestyle  . Physical activity    Days per week: Not on file    Minutes per session: Not on file  . Stress: Not on file  Relationships  . Social Herbalist on phone: Not on file    Gets together: Not on file    Attends religious service: Not on file    Active member of club or organization: Not on file    Attends meetings of clubs or  organizations: Not on file    Relationship status: Not on file  Other Topics Concern  . Not on file  Social History Narrative   Lives in [place], was living in [place], [place] for 20 years   No children, not married   Single   Family is nearby, mom and sister nearby   Currently on disability: 4 years ago she had an episode of psychosis (states that she didn't sleep for ~6-8 days prior to) and was in and out of mental institution for 6 months, lost her job and had to move back to [place] to be near family    Tobacco Counseling Counseling  given: Not Answered   Clinical Intake:  Pre-visit preparation completed: Yes  Pain : No/denies pain  Diabetes: No  How often do you need to have someone help you when you read instructions, pamphlets, or other written materials from your doctor or pharmacy?: 2 - Rarely  Interpreter Needed?: No  Information entered by :: Denman George LPN   Activities of Daily Living In your present state of health, do you have any difficulty performing the following activities: 07/30/2019 03/20/2019  Hearing? N N  Vision? N N  Difficulty concentrating or making decisions? N N  Walking or climbing stairs? N Y  Comment - Shortness of breath  Dressing or bathing? N N  Doing errands, shopping? N N  Preparing Food and eating ? N -  Using the Toilet? N -  In the past six months, have you accidently leaked urine? N -  Do you have problems with loss of bowel control? N -  Managing your Medications? N -  Managing your Finances? N -  Housekeeping or managing your Housekeeping? N -  Some recent data might be hidden     Immunizations and Health Maintenance Immunization History  Administered Date(s) Administered  . Tdap 05/12/2018   Health Maintenance Due  Topic Date Due  . PAP SMEAR-Modifier  10/09/2018    Patient Care Team: [name], [place] as PCP - General (Physician Assistant) Thornton Park, MD as Consulting Physician (Gastroenterology) Daron Offer,  Richard Miu, MD as Consulting Physician (Psychiatry)  Indicate any recent Medical Services you may have received from other than [facility] providers in the past year (date may be approximate).     Assessment:   This is a routine wellness examination for [place].  Hearing/Vision screen No exam data present  Dietary issues and exercise activities discussed: Current Exercise Habits: Home exercise routine, Type of exercise: treadmill, Time (Minutes): 30, Frequency (Times/Week): 5, Weekly Exercise (Minutes/Week): 150, Intensity: Mild  Goals   None    Depression Screen PHQ 2/9 Scores 07/30/2019 07/06/2018 04/10/2018 03/31/2017  PHQ - 2 Score 0 1 3 3   PHQ- 9 Score - - 9 10    Fall Risk Fall Risk  07/30/2019 07/06/2018  Falls in the past year? 0 No  Injury with Fall? 0 -  Follow up Falls evaluation completed;Education provided;Falls prevention discussed -    Is the patient's home free of loose throw rugs in walkways, pet beds, electrical cords, etc?   yes      Grab bars in the bathroom? yes      Handrails on the stairs?   yes      Adequate lighting?   yes  Cognitive Function: no cognitive concerns at this time  Cognitive Testing  Alert? Yes         Normal Appearance? Yes  Oriented to person? Yes           Place? Yes  Time? Yes  Recall of three objects? Yes  Can perform simple calculations? Yes  Displays appropriate judgment? Yes  Can read the correct time from a watch face? Yes   Screening Tests Health Maintenance  Topic Date Due  . PAP SMEAR-Modifier  10/09/2018  . MAMMOGRAM  03/01/2020  . COLONOSCOPY  12/03/2021  . TETANUS/TDAP  05/12/2028  . HIV Screening  Completed  . INFLUENZA VACCINE  Discontinued    Qualifies for Shingles Vaccine? Discussed and patient will check with pharmacy for coverage.  Patient education handout provided    Cancer Screenings: Lung: Low Dose CT Chest recommended  if Age 74-80 years, 33 pack-year currently smoking OR have quit w/in 15years. Patient  does not qualify. Breast: Up to date on Mammogram? Yes; ordered today Up to date of Bone Density/Dexa? Yes; not indicated  Colorectal: colonoscopy 12/04/18 with Dr. Tarri Glenn     Plan:  I have personally reviewed and addressed the Medicare Annual Wellness questionnaire and have noted the following in the patient's chart:  A. Medical and social history B. Use of alcohol, tobacco or illicit drugs  C. Current medications and supplements D. Functional ability and status E.  Nutritional status F.  Physical activity G. Advance directives H. List of other physicians I.  Hospitalizations, surgeries, and ER visits in previous 12 months J.  [place] such as hearing and vision if needed, cognitive and depression L. Referrals, records requested, and appointments- mammogram ordered   In addition, I have reviewed and discussed with patient certain preventive protocols, quality metrics, and best practice recommendations. A written personalized care plan for preventive services as well as general preventive health recommendations were provided to patient.   Signed,  Denman George, LPN  Nurse Health Advisor   Nurse Notes: no additional

## 2019-07-30 NOTE — Progress Notes (Signed)
I have reviewed the documentation from the recent AWV done by [name]; I agree with the documentation and will follow up on any recommendations or abnormal findings as suggested. [name], MD [facility]    

## 2019-08-02 DIAGNOSIS — F3132 Bipolar disorder, current episode depressed, moderate: Secondary | ICD-10-CM | POA: Diagnosis not present

## 2019-08-02 DIAGNOSIS — F5101 Primary insomnia: Secondary | ICD-10-CM | POA: Diagnosis not present

## 2019-08-09 ENCOUNTER — Other Ambulatory Visit: Payer: Self-pay | Admitting: Physician Assistant

## 2019-08-27 DIAGNOSIS — F5101 Primary insomnia: Secondary | ICD-10-CM | POA: Diagnosis not present

## 2019-08-27 DIAGNOSIS — F3132 Bipolar disorder, current episode depressed, moderate: Secondary | ICD-10-CM | POA: Diagnosis not present

## 2019-10-10 ENCOUNTER — Other Ambulatory Visit: Payer: Self-pay

## 2019-10-10 ENCOUNTER — Other Ambulatory Visit: Payer: Self-pay | Admitting: Physician Assistant

## 2019-10-10 ENCOUNTER — Ambulatory Visit
Admission: RE | Admit: 2019-10-10 | Discharge: 2019-10-10 | Disposition: A | Discharge status: Home / Self Care | Payer: PPO | Source: Ambulatory Visit | Attending: Family Medicine | Admitting: Family Medicine

## 2019-10-10 DIAGNOSIS — Z1231 Encounter for screening mammogram for malignant neoplasm of breast: Secondary | ICD-10-CM

## 2019-11-03 IMAGING — US US ABDOMEN COMPLETE
1 series · 14 of 25 positions shown · non-contrast
Comparison: None.

CLINICAL DATA: Abdominal pain

EXAM:
ABDOMEN ULTRASOUND COMPLETE

[Series 1: us abdomen complete · 0.18mm/px · 14 of 76 slices shown]
[im 1/76]
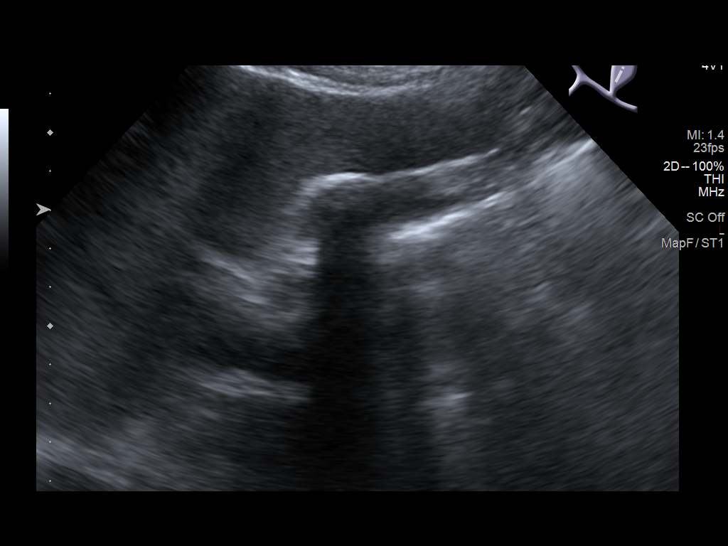
[im 7/76]
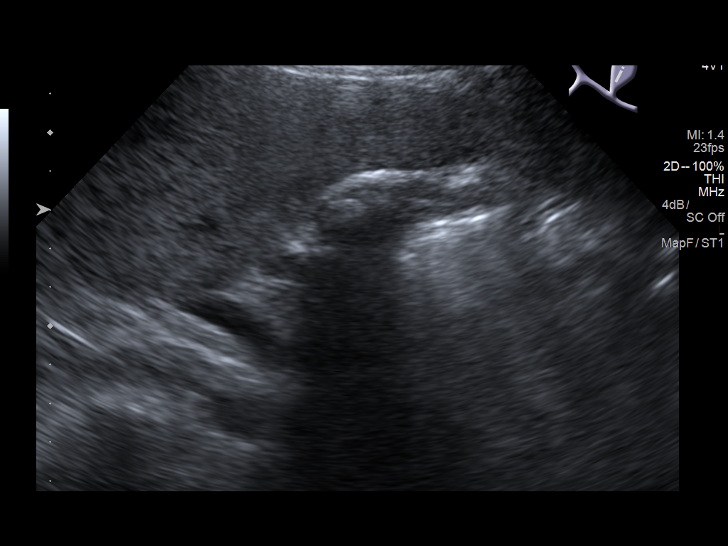
[im 13/76]
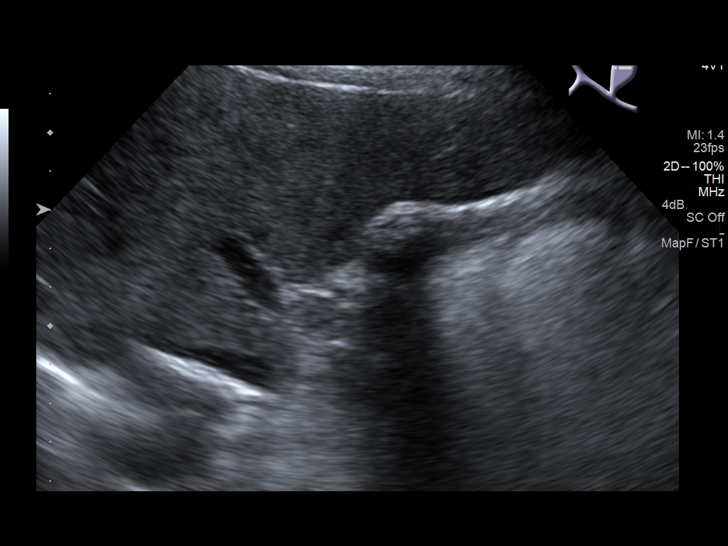
[im 19/76]
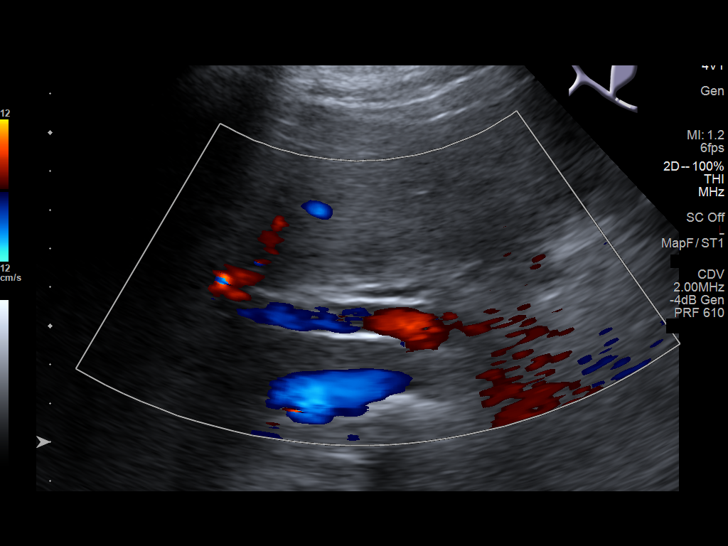
[im 26/76]
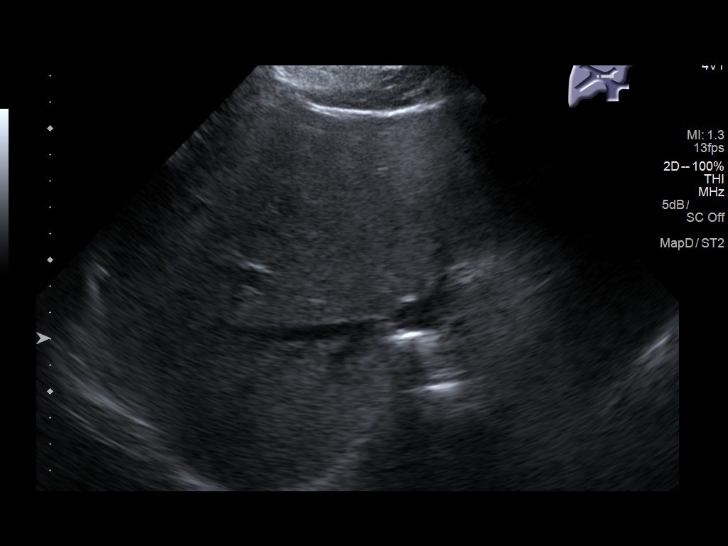
[im 29/76]
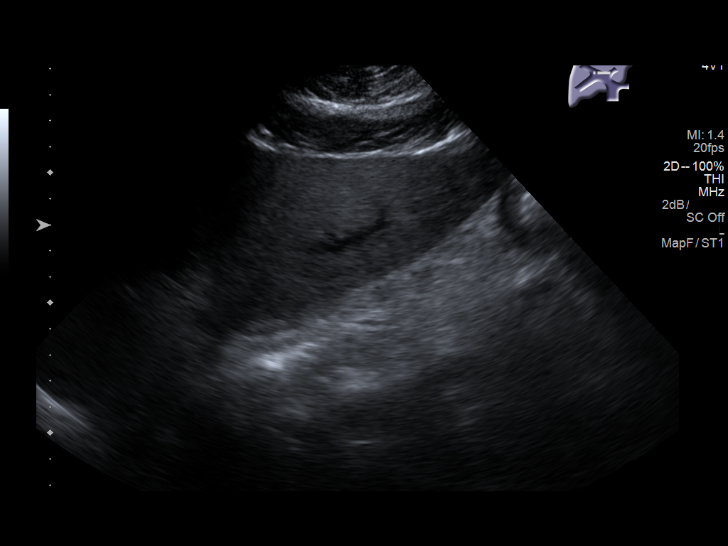
[im 35/76]
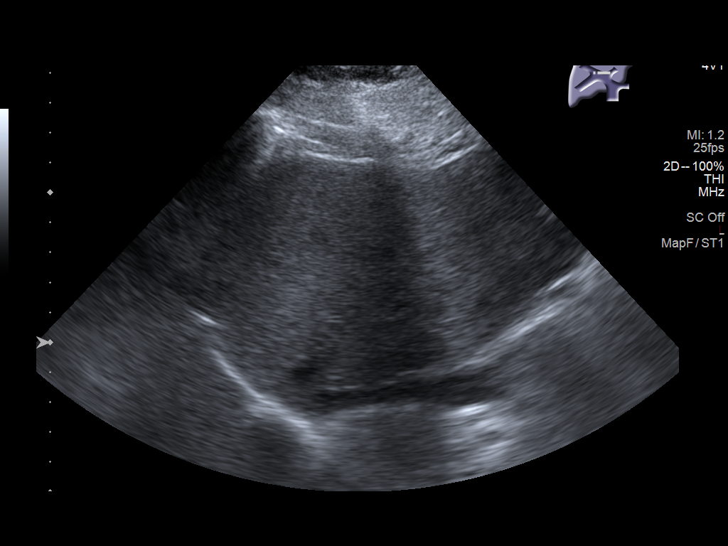
[im 41/76]
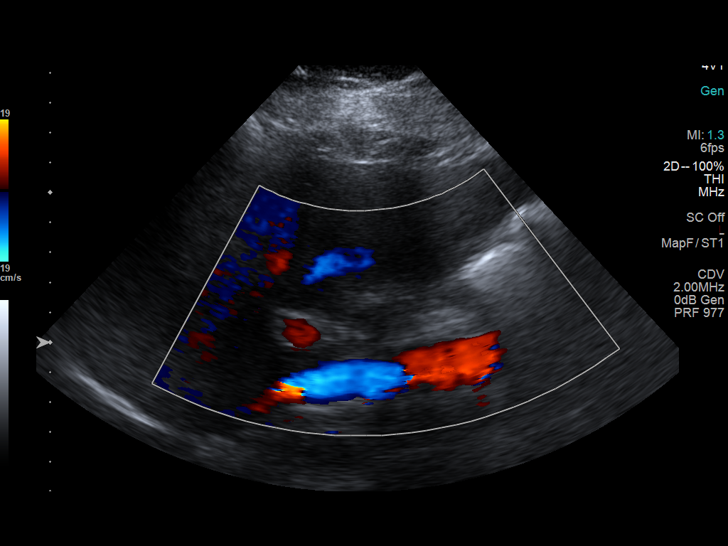
[im 47/76]
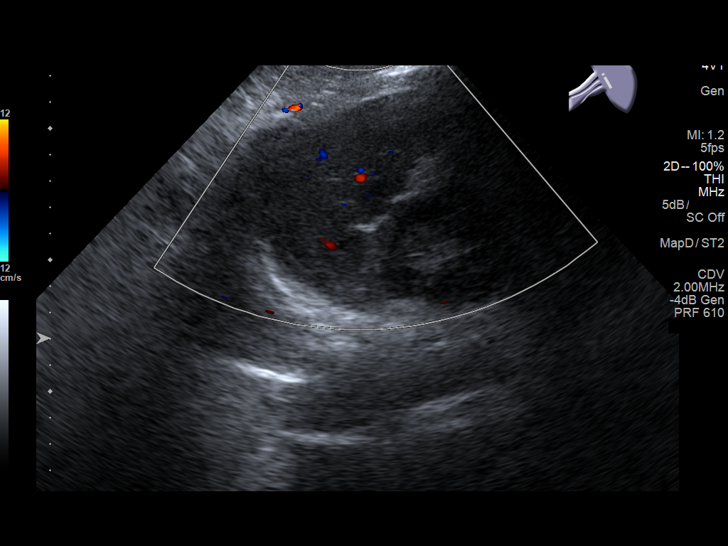
[im 51/76]
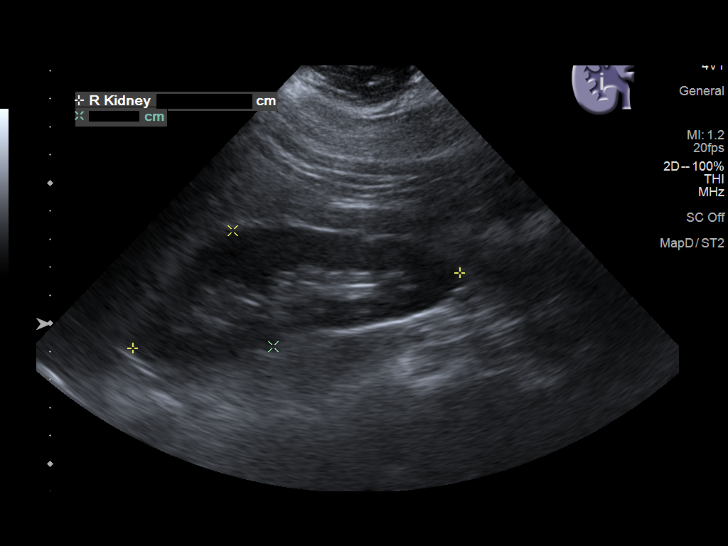
[im 57/76]
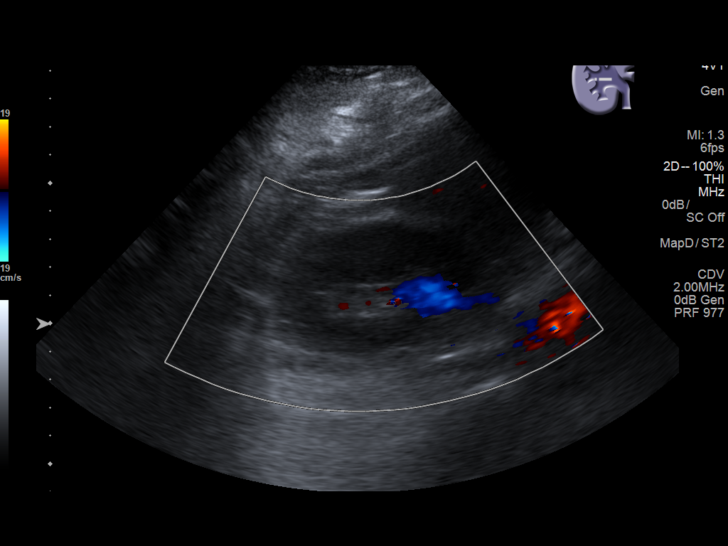
[im 63/76]
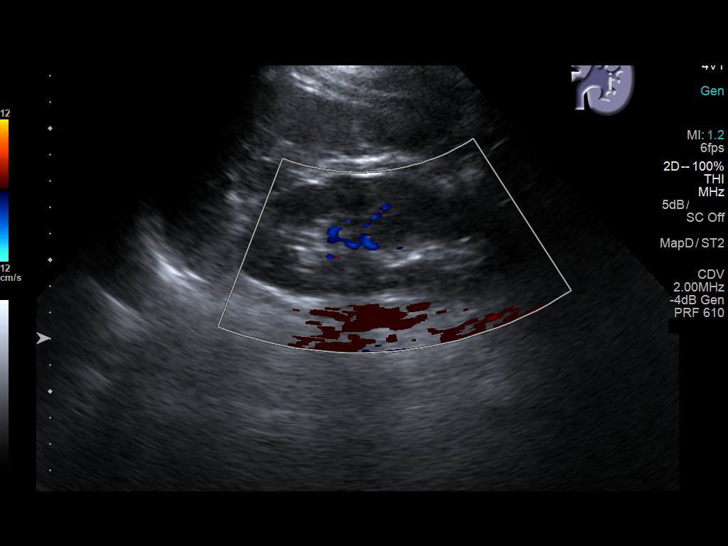
[im 69/76]
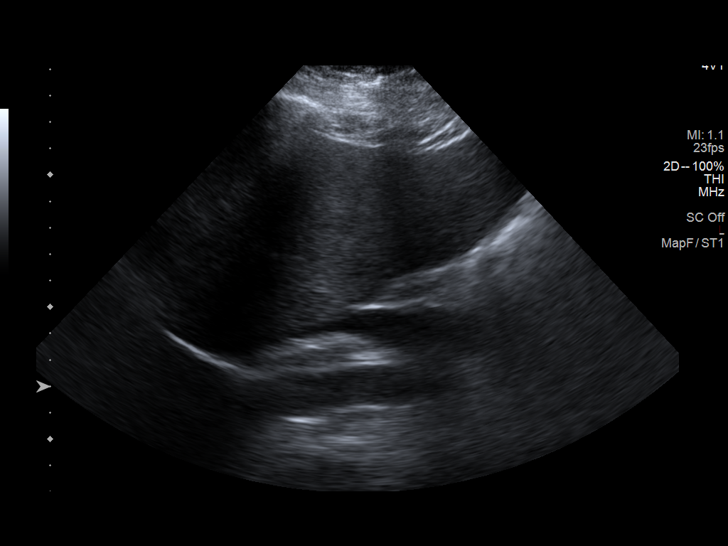
[im 76/76]
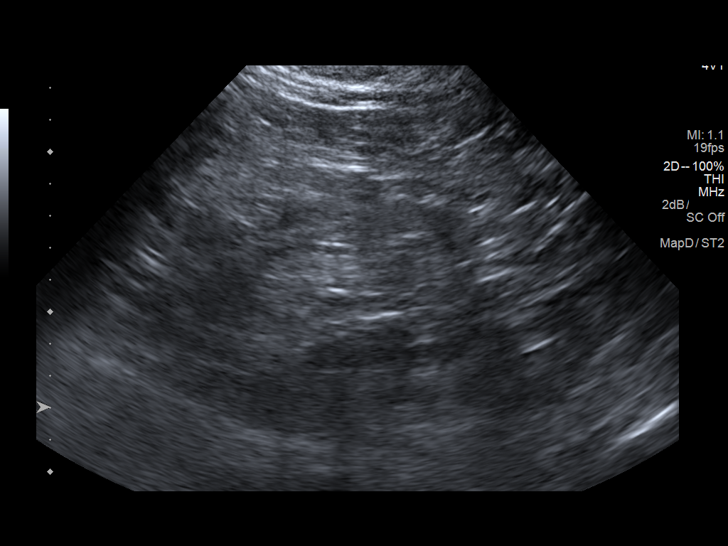

[14 of 25 positions shown; findings below may reference images not displayed]

FINDINGS: Gallbladder: Contracted with multiple shadowing stones measuring up
to 1.1 cm. Negative sonographic Murphy. Mild wall thickening at 4
mm.

Common bile duct: Diameter: 3.9 mm

Liver: Increased echogenicity. No focal abnormality. Portal vein is
patent on color Doppler imaging with normal direction of blood flow
towards the liver.

IVC: No abnormality visualized.

Pancreas: Visualized portion unremarkable.

Spleen: Size and appearance within normal limits.

Right Kidney: Length: 12 cm. Echogenicity within normal limits. No
mass or hydronephrosis visualized.

Left Kidney: Length: 11 cm. Echogenicity within normal limits. No
mass or hydronephrosis visualized.

Abdominal aorta: No aneurysm visualized.

Other findings: None.
IMPRESSION: 1. Contracted gallbladder with multiple shadowing stones and mild
wall thickening, likely augmented by contracted appearance of
gallbladder. Negative sonographic Murphy. No biliary dilatation
2. Slight increased hepatic echogenicity, possible steatosis.

## 2019-11-05 DIAGNOSIS — F5101 Primary insomnia: Secondary | ICD-10-CM | POA: Diagnosis not present

## 2019-11-05 DIAGNOSIS — F3132 Bipolar disorder, current episode depressed, moderate: Secondary | ICD-10-CM | POA: Diagnosis not present

## 2019-11-07 DIAGNOSIS — F3132 Bipolar disorder, current episode depressed, moderate: Secondary | ICD-10-CM | POA: Diagnosis not present

## 2019-11-07 DIAGNOSIS — F3181 Bipolar II disorder: Secondary | ICD-10-CM | POA: Diagnosis not present

## 2019-12-05 DIAGNOSIS — F3181 Bipolar II disorder: Secondary | ICD-10-CM | POA: Diagnosis not present

## 2019-12-10 ENCOUNTER — Other Ambulatory Visit: Payer: Self-pay | Admitting: Physician Assistant

## 2019-12-18 IMAGING — RF DG UGI W/ KUB
13 series · 13 of 13 positions shown · non-contrast
Comparison: None.

CLINICAL DATA: Preop bariatric surgery.

EXAM:
ESOPHOGRAM / BARIUM SWALLOW / BARIUM TABLET STUDY
TECHNIQUE: Combined double contrast and single contrast examination performed
using effervescent crystals, thick barium liquid, and thin barium
liquid. The patient was observed with fluoroscopy swallowing a 13 mm
barium sulphate tablet.
FLUOROSCOPY TIME:  Fluoroscopy Time:  1 minutes 42 seconds
Radiation Exposure Index (if provided by the fluoroscopic device):
127.2 mGy
Number of Acquired Spot Images: 0

[Series 1: t abdomen supine · 0.15mm/px · 1 of 1 slices shown]
[im 1/1]
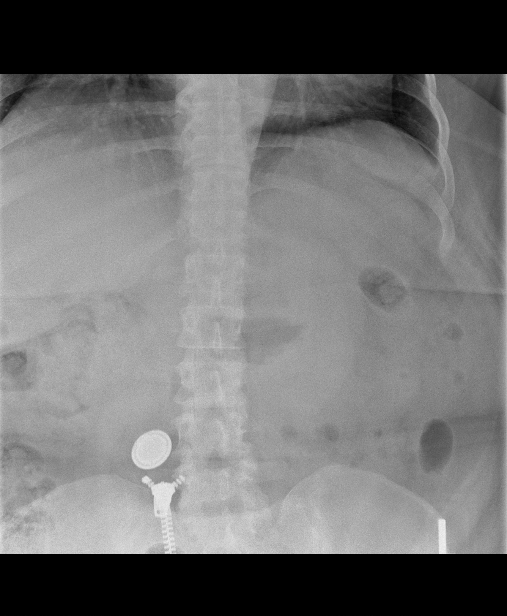

[Series 2: fluoro_barium 2fps_bw · 0.18mm/px · 1 of 1 slices shown (1 of 12)]
[im 1/1]
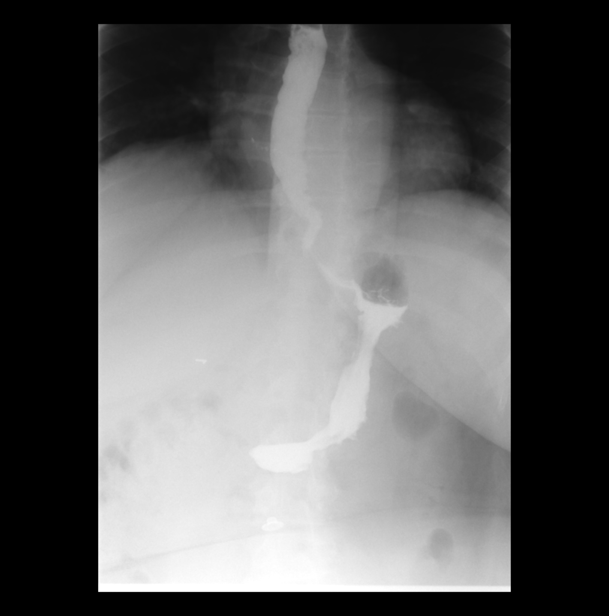

[Series 3: fluoro_barium 2fps_bw · 0.18mm/px · 1 of 1 slices shown (2 of 12)]
[im 1/1]
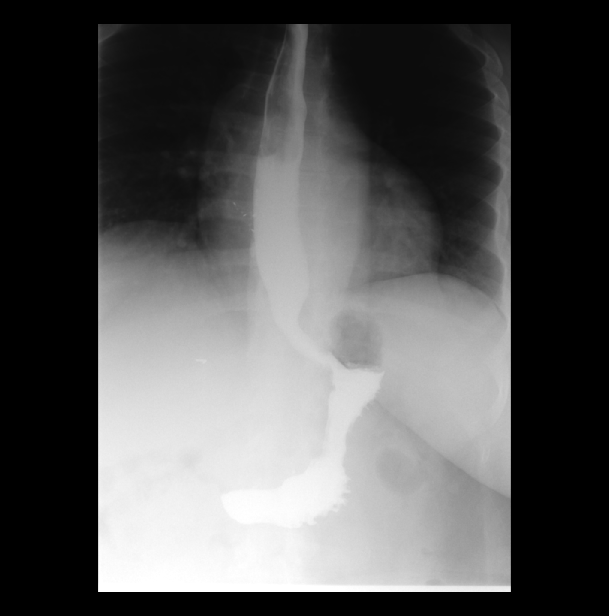

[Series 4: fluoro_barium 2fps_bw · 0.18mm/px · 1 of 1 slices shown (3 of 12)]
[im 1/1]
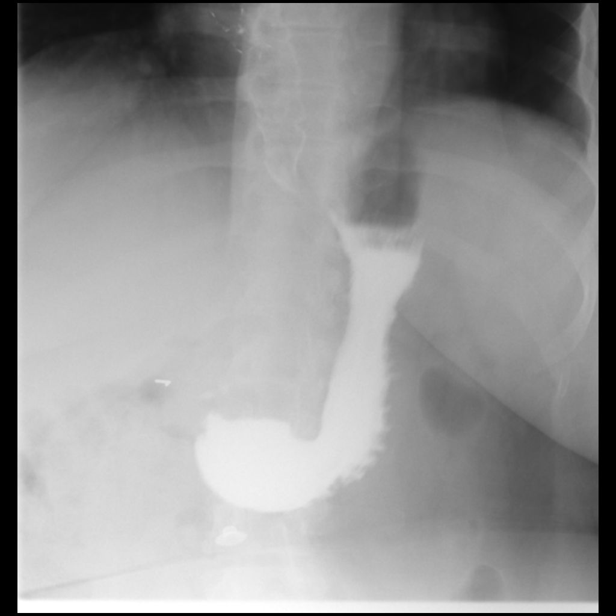

[Series 5: fluoro_barium 2fps_bw · 0.18mm/px · 1 of 1 slices shown (4 of 12)]
[im 1/1]
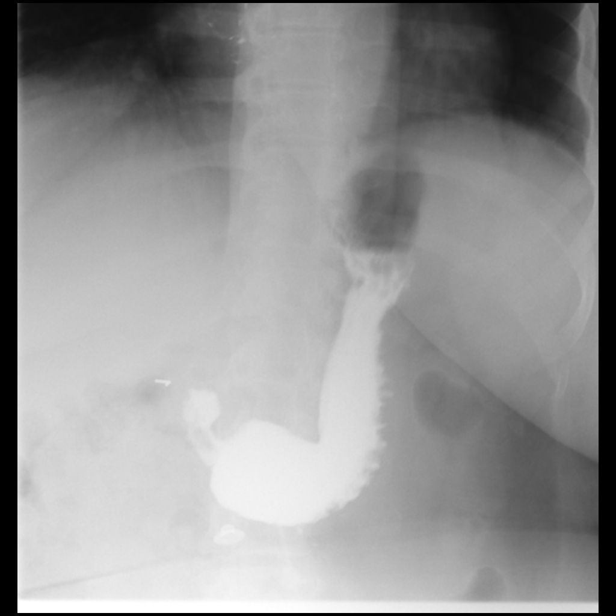

[Series 6: fluoro_barium 2fps_bw · 0.18mm/px · 1 of 1 slices shown (5 of 12)]
[im 1/1]
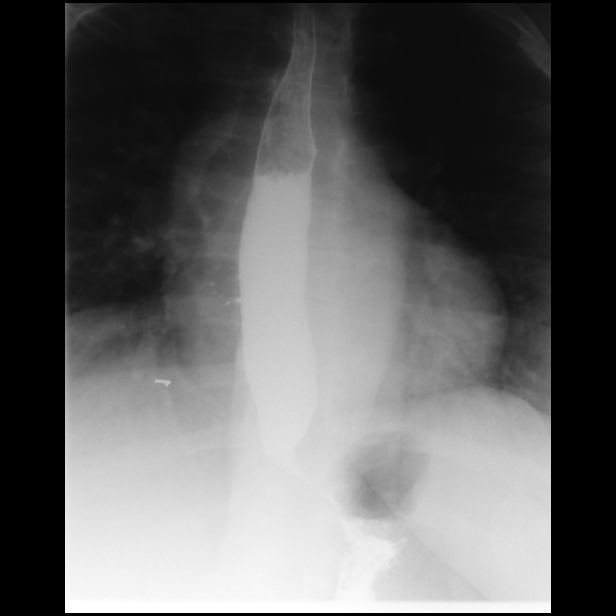

[Series 7: fluoro_barium 2fps_bw · 0.18mm/px · 1 of 1 slices shown (6 of 12)]
[im 1/1]
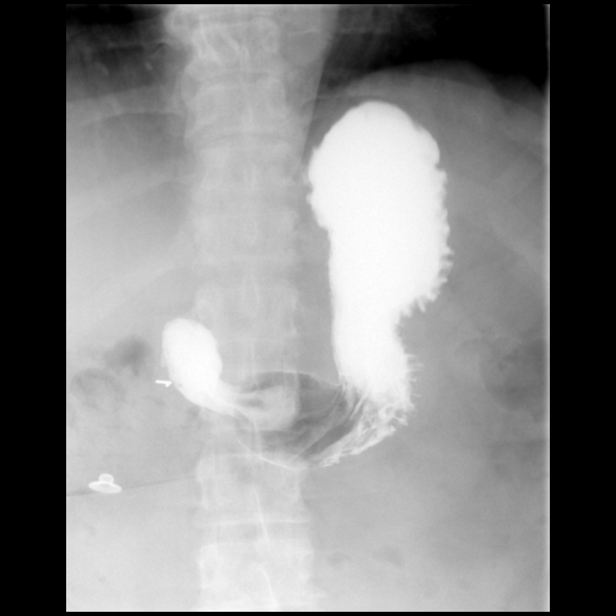

[Series 8: fluoro_barium 2fps_bw · 0.18mm/px · 1 of 1 slices shown (7 of 12)]
[im 1/1]
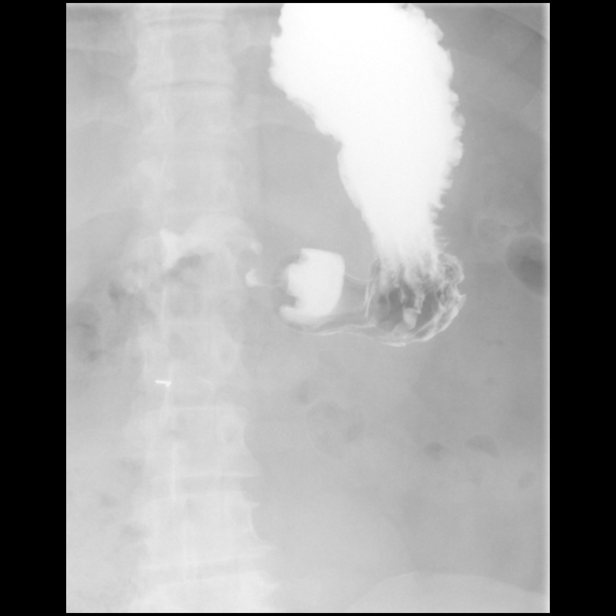

[Series 9: fluoro_barium 2fps_bw · 0.18mm/px · 1 of 1 slices shown (8 of 12)]
[im 1/1]
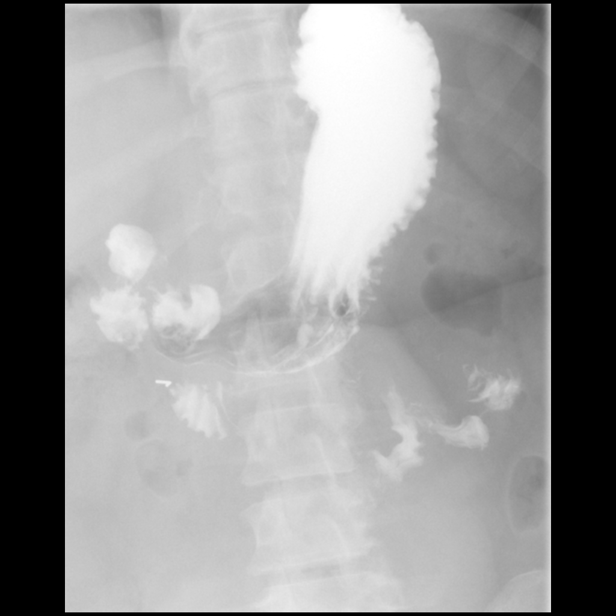

[Series 10: fluoro_barium 2fps_bw · 0.18mm/px · 1 of 1 slices shown (9 of 12)]
[im 1/1]
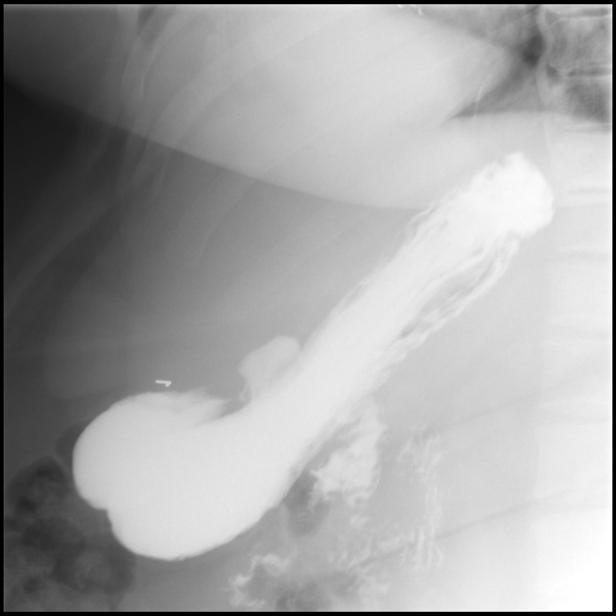

[Series 11: fluoro_barium 2fps_bw · 0.18mm/px · 1 of 1 slices shown (10 of 12)]
[im 1/1]
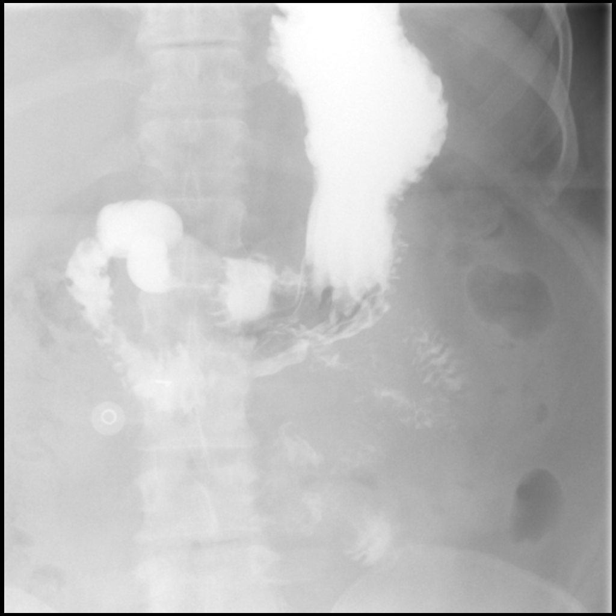

[Series 12: fluoro_barium 2fps_bw · 0.18mm/px · 1 of 1 slices shown (11 of 12)]
[im 1/1]
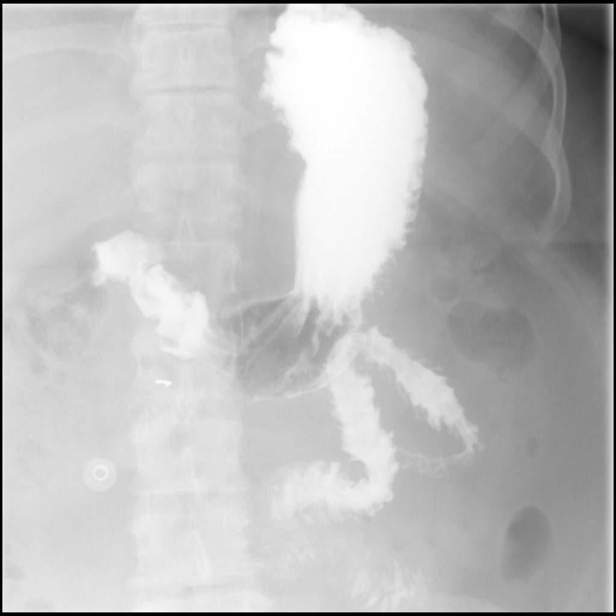

[Series 13: fluoro_barium 2fps_bw · 0.18mm/px · 1 of 1 slices shown (12 of 12)]
[im 1/1]
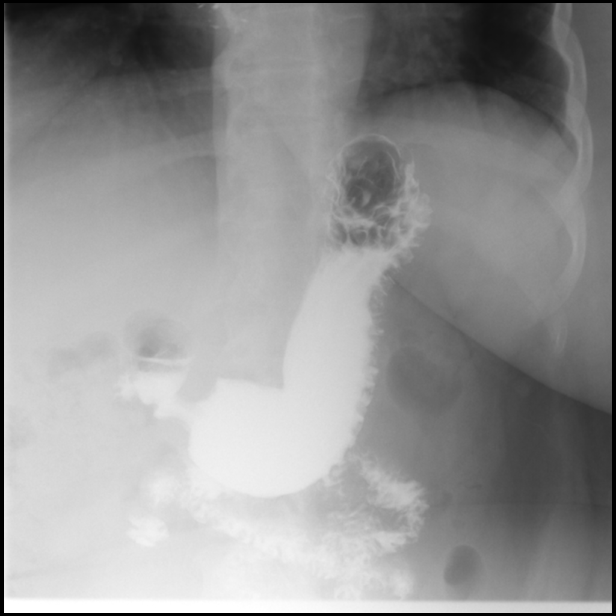

[13 of 13 positions shown; findings below may reference images not displayed]

FINDINGS: Oral contrast flowed freely through the esophagus without
obstruction or mass. GE junction appears normal. Single contrast
exam of the stomach demonstrates normal orientation.

There is a prominent mucosal pattern along the distal greater
curvature of the stomach with multiple small parallel linear
invaginations suggesting mucosal edema or potential mucosal lesion.
Suggestion of a 3-4 cm concavity through this region. This persists
in multiple projections and orientations.

The gastric antrum and pylorus appear normal. Normal duodenum bulb
and C-loop of the duodenum. Ligament Treitz in expected location.
IMPRESSION: 1. Normal anatomy of the esophagus, stomach, and duodenum.
2. Persistent mucosal edema pattern along the distal greater
curvature the stomach may represent gastritis or other mucosal
lesion. Consider endoscopy for further evaluation prior to surgery.

These results will be called to the ordering clinician or
representative by the Radiologist Assistant, and communication
documented in the PACS or zVision Dashboard.

## 2020-01-05 ENCOUNTER — Ambulatory Visit: Payer: PPO | Attending: Internal Medicine

## 2020-01-05 DIAGNOSIS — Z23 Encounter for immunization: Secondary | ICD-10-CM

## 2020-01-05 NOTE — Progress Notes (Signed)
   Covid-19 Vaccination Clinic  Name:  KENAE MALACHI    MRN: [id number] DOB: 1965/12/04  01/05/2020  Ms. Stemler was observed post Covid-19 immunization for 15 minutes without incident. She was provided with Vaccine Information Sheet and instruction to access the V-Safe system.   Ms. Pico was instructed to call 911 with any severe reactions post vaccine: Marland Kitchen Difficulty breathing  . Swelling of face and throat  . A fast heartbeat  . A bad rash all over body  . Dizziness and weakness   Immunizations Administered    Name Date Dose VIS Date Route   Pfizer COVID-19 Vaccine 01/05/2020  9:57 AM 0.3 mL 09/14/2019 Intramuscular   Manufacturer: [place]   Lot: [id number]   [place]: [id number]

## 2020-01-07 ENCOUNTER — Other Ambulatory Visit: Payer: Self-pay | Admitting: Physician Assistant

## 2020-01-09 DIAGNOSIS — F3181 Bipolar II disorder: Secondary | ICD-10-CM | POA: Diagnosis not present

## 2020-01-29 ENCOUNTER — Ambulatory Visit: Payer: PPO | Attending: Internal Medicine

## 2020-01-29 DIAGNOSIS — Z23 Encounter for immunization: Secondary | ICD-10-CM

## 2020-01-29 NOTE — Progress Notes (Signed)
   Covid-19 Vaccination Clinic  Name:  [name]    MRN: [id number] DOB: Feb 03, 1966  01/29/2020  [name] was observed post Covid-19 immunization for 15 minutes without incident. She was provided with Vaccine Information Sheet and instruction to access the V-Safe system.   [name] was instructed to call 911 with any severe reactions post vaccine: Marland Kitchen Difficulty breathing  . Swelling of face and throat  . A fast heartbeat  . A bad rash all over body  . Dizziness and weakness   Immunizations Administered    Name Date Dose VIS Date Route   Pfizer COVID-19 Vaccine 01/29/2020 10:51 AM 0.3 mL 11/28/2018 Intramuscular   Manufacturer: [place]   Lot: [id number]   [place]: [id number]

## 2020-02-04 DIAGNOSIS — F5101 Primary insomnia: Secondary | ICD-10-CM | POA: Diagnosis not present

## 2020-02-04 DIAGNOSIS — F3132 Bipolar disorder, current episode depressed, moderate: Secondary | ICD-10-CM | POA: Diagnosis not present

## 2020-03-21 ENCOUNTER — Encounter (HOSPITAL_COMMUNITY): Payer: Self-pay | Admitting: Emergency Medicine

## 2020-03-21 ENCOUNTER — Ambulatory Visit (HOSPITAL_COMMUNITY): Admission: RE | Admit: 2020-03-21 | Discharge status: Absent without leave | Payer: PPO | Source: Ambulatory Visit

## 2020-03-21 ENCOUNTER — Other Ambulatory Visit: Payer: Self-pay

## 2020-03-21 ENCOUNTER — Ambulatory Visit (HOSPITAL_COMMUNITY)
Admission: EM | Admit: 2020-03-21 | Discharge: 2020-03-21 | Disposition: A | Discharge status: Home / Self Care | Payer: PPO | Attending: Physician Assistant | Admitting: Physician Assistant

## 2020-03-21 DIAGNOSIS — L0291 Cutaneous abscess, unspecified: Secondary | ICD-10-CM | POA: Diagnosis not present

## 2020-03-21 HISTORY — DX: Disorder of kidney and ureter, unspecified: N28.9

## 2020-03-21 MED ORDER — DOXYCYCLINE HYCLATE 100 MG PO CAPS
100.0000 mg | ORAL_CAPSULE | Freq: Two times a day (BID) | ORAL | 0 refills | Status: DC
Start: 2020-03-21 — End: 2020-04-02

## 2020-03-21 MED ORDER — LIDOCAINE-EPINEPHRINE (PF) 2 %-1:200000 IJ SOLN
INTRAMUSCULAR | Status: AC
  Filled 2020-03-21: qty 20

## 2020-03-21 NOTE — Discharge Instructions (Signed)
Take the doxycycline 2 times a day for 10 days  For the next 48 hours, the packing should remain Apply gentle massage to express any discharge, some is normal Some bleeding today may occur  If it is healing well and you have no concerns you may remove packing, however if large amount of discharge is present, you should return or follow up with PCP for recheck

## 2020-03-21 NOTE — ED Triage Notes (Signed)
Abscess on abdomen for 2 days. She has had similar lesions on abdomen that have self-resolved.

## 2020-03-21 NOTE — ED Provider Notes (Signed)
[place]    CSN: [id number] Arrival date & time: 03/21/20  0845      History   Chief Complaint Chief Complaint  Patient presents with  . Appointment  . Abscess    HPI [name] is a 54 y.o. female.   Patient presents for evaluation of abscess on her right lower abdomen.  She reports this is been there for about a week.  She reports having other smaller pockets of pus that have drained in the area recently.  She reports this 1 has been growing and had started to drain but is since stopped.  She reports is quite painful.  Denies fever and chills.  Denies abdominal pain.  No trauma or known injuries to cause infections.  There was no swelling there prior to infection starting.     Past Medical History:  Diagnosis Date  . Anemia   . Anxiety   . Asthma   . Bipolar 1 disorder ([place])   . Depression   . Diabetes mellitus without complication ([place])   . Dyspnea 2020   with exersion  . GERD (gastroesophageal reflux disease)   . Heart murmur    HISTORY OF CLOSED IN 2006  . History of chicken pox   . History of Helicobacter pylori infection   . History of migraine   . Hypertension   . Obesity   . PFO (patent foramen ovale)   . Renal disorder   . Sleep apnea    does not use cpap  . Vitamin D deficiency     Patient Active Problem List   Diagnosis Date Noted  . Polyp of colon   . Abdominal pain   . Knee pain, chronic 04/10/2018  . Insulin resistance 04/10/2018  . Vitamin D deficiency   . Sleep apnea 03/31/2017  . Morbid obesity ([place]) 03/31/2017  . Hypertension 03/31/2017  . Bipolar 1 disorder ([place]) 03/31/2017    Past Surgical History:  Procedure Laterality Date  . BIOPSY  12/04/2018   Procedure: BIOPSY;  Surgeon: Thornton Park, MD;  Location: [facility];  Service: Gastroenterology;;  EGD and Colon  . BREAST REDUCTION SURGERY  1986  . CHOLECYSTECTOMY N/A 03/23/2019   Procedure: LAPAROSCOPIC CHOLECYSTECTOMY;  Surgeon: Clovis Riley, MD;   Location: [facility];  Service: General;  Laterality: N/A;  . COLONOSCOPY WITH PROPOFOL N/A 12/04/2018   Procedure: COLONOSCOPY WITH PROPOFOL;  Surgeon: Thornton Park, MD;  [facility]: [facility];  Service: Gastroenterology;  Laterality: N/A;  . ESOPHAGOGASTRODUODENOSCOPY (EGD) WITH PROPOFOL N/A 12/04/2018   Procedure: ESOPHAGOGASTRODUODENOSCOPY (EGD) WITH PROPOFOL;  Surgeon: Thornton Park, MD;  [facility];  Service: Gastroenterology;  Laterality: N/A;  . PATENT FORAMEN OVALE(PFO) CLOSURE  2006  . TONSILLECTOMY    . WISDOM TOOTH EXTRACTION      OB History   No obstetric history on file.      Home Medications    Prior to Admission medications   Medication Sig Start Date End Date Taking? Authorizing Provider  ARIPiprazole (ABILIFY) 10 MG tablet Take 10 mg by mouth daily.  10/09/18  Yes [provider]  ferrous sulfate 325 (65 FE) MG tablet Take 325 mg by mouth daily with breakfast.   Yes [provider]  lamoTRIgine (LAMICTAL) 25 MG tablet Take 25 mg by mouth daily.    Yes [provider]  lisinopril-hydrochlorothiazide (ZESTORETIC) 20-25 MG tablet TAKE 1 TABLET BY MOUTH EVERY DAY 01/07/20  Yes Vivi Barrack, MD  metFORMIN (GLUCOPHAGE) 1000 MG tablet TAKE ONE  TABLET BY MOUTH DAILY WITH BREAKFAST 12/10/19  Yes [name], PA  pantoprazole (PROTONIX) 40 MG tablet Take 1 tablet (40 mg total) by mouth daily. 03/05/19  Yes Thornton Park, MD  traZODone (DESYREL) 50 MG tablet Take 50 mg by mouth at bedtime.   Yes [provider]  albuterol (VENTOLIN HFA) 108 (90 Base) MCG/ACT inhaler Inhale 1-2 puffs into the lungs every 6 (six) hours as needed for wheezing or shortness of breath.    [provider]  Diclofenac Sodium (PENNSAID) 2 % SOLN Place 1 application onto the skin 2 (two) times daily. Patient taking differently: Place 1 application onto the skin daily as needed (pain).  08/08/18   Gerda Diss, DO  doxycycline (VIBRAMYCIN)  100 MG capsule Take 1 capsule (100 mg total) by mouth 2 (two) times daily. 03/21/20   Ijanae Macapagal, Marguerita Beards, PA-C  oxyCODONE (ROXICODONE) 5 MG immediate release tablet Take 1 tablet (5 mg total) by mouth every 8 (eight) hours as needed. Alternate tylenol and ibuprofen for the first few days. Take narcotic pain medication only if needed for severe/ breakthrough pain. Patient not taking: Reported on 07/30/2019 03/23/19 03/22/20  Clovis Riley, MD    Family History Family History  Problem Relation Age of Onset  . Diabetes Mother   . Hypertension Mother   . Stroke Father   . Diabetes Maternal Grandmother   . CVA Maternal Grandmother   . Diabetes Maternal Grandfather   . Colon cancer Paternal Grandmother   . Diabetes Paternal Grandfather   . Breast cancer Maternal Aunt   . Asthma Other   . Cancer Other   . COPD Other   . Esophageal cancer Neg Hx   . Rectal cancer Neg Hx   . Stomach cancer Neg Hx   . Liver disease Neg Hx     Social History Social History   Tobacco Use  . Smoking status: Never Smoker  . Smokeless tobacco: Never Used  Vaping Use  . Vaping Use: Never used  Substance Use Topics  . Alcohol use: Yes    Alcohol/week: 1.0 - 2.0 standard drink    Types: 1 - 2 Glasses of wine per week  . Drug use: No     Allergies   Patient has no known allergies.   Review of Systems Review of Systems   Physical Exam Triage Vital Signs ED Triage Vitals  Enc Vitals Group     BP 03/21/20 0917 121/86     Pulse Rate 03/21/20 0917 (!) 115     Resp 03/21/20 0917 16     Temp 03/21/20 0917 99.1 F (37.3 C)     Temp src --      SpO2 03/21/20 0917 98 %     Weight --      Height --      Head Circumference --      Peak Flow --      Pain Score 03/21/20 0915 9     Pain Loc --      Pain Edu? --      Excl. in [place]? --    No data found.  Updated Vital Signs BP 121/86   Pulse (!) 115   Temp 99.1 F (37.3 C)   Resp 16   LMP 02/07/2020   SpO2 98%   Visual Acuity Right Eye  Distance:   Left Eye Distance:   Bilateral Distance:    Right Eye Near:   Left Eye Near:    Bilateral Near:  Physical Exam Vitals and nursing note reviewed.  Constitutional:      Appearance: Normal appearance.  Abdominal:     General: Abdomen is flat.     Palpations: Abdomen is soft.     Comments: There is approximately a 6 cm x 6 cm area of induration with a central fluctuance with crusting from possible drainage in the skin of the right lower abdomen.  On palpation around the skin folds does not appear this is contiguous with the deeper tissues or abdominal wall musculature.  Skin:    General: Skin is warm and dry.  Neurological:     Mental Status: She is alert.      UC Treatments / Results  Labs (all labs ordered are listed, but only abnormal results are displayed) Labs Reviewed  AEROBIC CULTURE (SUPERFICIAL SPECIMEN)    EKG   Radiology No results found.  Procedures Incision and Drainage  Date/Time: 03/22/2020 12:32 AM Performed by: Purnell Shoemaker, PA-C Authorized by: Purnell Shoemaker, PA-C   Consent:    Consent obtained:  Verbal   Consent given by:  Patient   Risks discussed:  Bleeding, incomplete drainage and pain   Alternatives discussed:  Alternative treatment Location:    Type:  Abscess   Location:  Trunk   Trunk location:  Abdomen (Right lower abdomen) Pre-procedure details:    Skin preparation:  Betadine and antiseptic wash Anesthesia (see MAR for exact dosages):    Anesthesia method:  Local infiltration   Local anesthetic:  Lidocaine 2% WITH epi Procedure type:    Complexity:  Simple Procedure details:    Incision types:  Single straight   Scalpel blade:  11   Wound management:  Probed and deloculated and irrigated with saline   Drainage:  Purulent   Drainage amount:  Moderate   Packing materials:  1/2 in gauze   Amount 1/2":  6 cm Post-procedure details:    Patient tolerance of procedure:  Tolerated well, no immediate complications    (including critical care time)  Medications Ordered in UC Medications - No data to display  Initial Impression / Assessment and Plan / UC Course  I have reviewed the triage vital signs and the nursing notes.  Pertinent labs & imaging results that were available during my care of the patient were reviewed by me and considered in my medical decision making (see chart for details).     #Abscess Patient is a 54 year old presenting with abscess of the right lower abdominal skin.  Successful drainage today.  Will place in packing as wound immediately closes due to skin fold lines.  Wound care discussed.  We will start her on doxycycline.  Wound culture was sent, given she has had similar skin infections in the area recently, however no recent antibiotics.  Follow-up precautions were discussed, that if she felt it is healing very well she could remove the packing herself and continue to monitor, however she had concerned she should return to urgent care follow-up with her primary care for wound check in 2 to 3 days.  She verbalized understanding and agreement with this plan. Final Clinical Impressions(s) / UC Diagnoses   Final diagnoses:  Abscess     Discharge Instructions     Take the doxycycline 2 times a day for 10 days  For the next 48 hours, the packing should remain Apply gentle massage to express any discharge, some is normal Some bleeding today may occur  If it is healing well and you have no  concerns you may remove packing, however if large amount of discharge is present, you should return or follow up with PCP for recheck      ED Prescriptions    Medication Sig Dispense Auth. Provider   doxycycline (VIBRAMYCIN) 100 MG capsule Take 1 capsule (100 mg total) by mouth 2 (two) times daily. 20 capsule Akin Yi, Marguerita Beards, PA-C     PDMP not reviewed this encounter.   Purnell Shoemaker, PA-C 03/22/20 [phone]

## 2020-03-23 ENCOUNTER — Encounter (HOSPITAL_COMMUNITY): Payer: Self-pay

## 2020-03-23 ENCOUNTER — Ambulatory Visit (HOSPITAL_COMMUNITY)
Admission: EM | Admit: 2020-03-23 | Discharge: 2020-03-23 | Disposition: A | Discharge status: Home / Self Care | Payer: PPO | Attending: Physician Assistant | Admitting: Physician Assistant

## 2020-03-23 DIAGNOSIS — Z5189 Encounter for other specified aftercare: Secondary | ICD-10-CM

## 2020-03-23 LAB — AEROBIC CULTURE W GRAM STAIN (SUPERFICIAL SPECIMEN)

## 2020-03-23 NOTE — ED Provider Notes (Signed)
[place]    CSN: [id number] Arrival date & time: 03/23/20  1007      History   Chief Complaint Chief Complaint  Patient presents with  . Follow-up  . Abscess    HPI [name] is a 54 y.o. female.   Patient reports her follow-up and wound check on abscess drain on 03/21/2020.  She reports he kept the bandage in place has not checked.  She has pain is improved since prior to drainage.  She reports she did have some chills and a fever of 101 last night however this did not persist through today.  She reports taking Tylenol.  She reports she had been taking the doxycycline as prescribed.  She reports she has not been able to remove the bandaging.  Denies deep abdominal pain. Moving bowels well.      Past Medical History:  Diagnosis Date  . Anemia   . Anxiety   . Asthma   . Bipolar 1 disorder ([place])   . Depression   . Diabetes mellitus without complication ([place])   . Dyspnea 2020   with exersion  . GERD (gastroesophageal reflux disease)   . Heart murmur    HISTORY OF CLOSED IN 2006  . History of chicken pox   . History of Helicobacter pylori infection   . History of migraine   . Hypertension   . Obesity   . PFO (patent foramen ovale)   . Renal disorder   . Sleep apnea    does not use cpap  . Vitamin D deficiency     Patient Active Problem List   Diagnosis Date Noted  . Polyp of colon   . Abdominal pain   . Knee pain, chronic 04/10/2018  . Insulin resistance 04/10/2018  . Vitamin D deficiency   . Sleep apnea 03/31/2017  . Morbid obesity ([place]) 03/31/2017  . Hypertension 03/31/2017  . Bipolar 1 disorder ([place]) 03/31/2017    Past Surgical History:  Procedure Laterality Date  . BIOPSY  12/04/2018   Procedure: BIOPSY;  Surgeon: Thornton Park, MD;  [facility];  Service: Gastroenterology;;  EGD and Colon  . BREAST REDUCTION SURGERY  1986  . CHOLECYSTECTOMY N/A 03/23/2019   Procedure: LAPAROSCOPIC CHOLECYSTECTOMY;  Surgeon: Clovis Riley, MD;  [facility];  Service: General;  Laterality: N/A;  . COLONOSCOPY WITH PROPOFOL N/A 12/04/2018   Procedure: COLONOSCOPY WITH PROPOFOL;  Surgeon: Thornton Park, MD;  [facility];  Service: Gastroenterology;  Laterality: N/A;  . ESOPHAGOGASTRODUODENOSCOPY (EGD) WITH PROPOFOL N/A 12/04/2018   Procedure: ESOPHAGOGASTRODUODENOSCOPY (EGD) WITH PROPOFOL;  Surgeon: Thornton Park, MD;  [facility];  Service: Gastroenterology;  Laterality: N/A;  . PATENT FORAMEN OVALE(PFO) CLOSURE  2006  . TONSILLECTOMY    . WISDOM TOOTH EXTRACTION      OB History   No obstetric history on file.      Home Medications    Prior to Admission medications   Medication Sig Start Date End Date Taking? Authorizing Provider  albuterol (VENTOLIN HFA) 108 (90 Base) MCG/ACT inhaler Inhale 1-2 puffs into the lungs every 6 (six) hours as needed for wheezing or shortness of breath.    [provider]  ARIPiprazole (ABILIFY) 10 MG tablet Take 10 mg by mouth daily.  10/09/18   [provider]  Diclofenac Sodium (PENNSAID) 2 % SOLN Place 1 application onto the skin 2 (two) times daily. Patient taking differently: Place 1 application onto the skin daily as needed (pain).  08/08/18  Gerda Diss, DO  doxycycline (VIBRAMYCIN) 100 MG capsule Take 1 capsule (100 mg total) by mouth 2 (two) times daily. 03/21/20   Tineshia Becraft, Marguerita Beards, PA-C  ferrous sulfate 325 (65 FE) MG tablet Take 325 mg by mouth daily with breakfast.    [provider]  lamoTRIgine (LAMICTAL) 25 MG tablet Take 25 mg by mouth daily.     [provider]  lisinopril-hydrochlorothiazide (ZESTORETIC) 20-25 MG tablet TAKE 1 TABLET BY MOUTH EVERY DAY 01/07/20   Vivi Barrack, MD  metFORMIN (GLUCOPHAGE) 1000 MG tablet TAKE ONE TABLET BY MOUTH DAILY WITH BREAKFAST 12/10/19   [name], PA  pantoprazole (PROTONIX) 40 MG tablet Take 1 tablet (40 mg total) by mouth daily. 03/05/19   Thornton Park,  MD  traZODone (DESYREL) 50 MG tablet Take 50 mg by mouth at bedtime.    [provider]    Family History Family History  Problem Relation Age of Onset  . Diabetes Mother   . Hypertension Mother   . Stroke Father   . Diabetes Maternal Grandmother   . CVA Maternal Grandmother   . Diabetes Maternal Grandfather   . Colon cancer Paternal Grandmother   . Diabetes Paternal Grandfather   . Breast cancer Maternal Aunt   . Asthma Other   . Cancer Other   . COPD Other   . Esophageal cancer Neg Hx   . Rectal cancer Neg Hx   . Stomach cancer Neg Hx   . Liver disease Neg Hx     Social History Social History   Tobacco Use  . Smoking status: Never Smoker  . Smokeless tobacco: Never Used  Vaping Use  . Vaping Use: Never used  Substance Use Topics  . Alcohol use: Yes    Alcohol/week: 1.0 - 2.0 standard drink    Types: 1 - 2 Glasses of wine per week  . Drug use: No     Allergies   Patient has no known allergies.   Review of Systems Review of Systems   Physical Exam Triage Vital Signs ED Triage Vitals  Enc Vitals Group     BP 03/23/20 1038 114/78     Pulse Rate 03/23/20 1038 (!) 110     Resp 03/23/20 1038 18     Temp 03/23/20 1038 98.5 F (36.9 C)     Temp Source 03/23/20 1038 Oral     SpO2 03/23/20 1038 98 %     Weight --      Height --      Head Circumference --      Peak Flow --      Pain Score 03/23/20 1037 2     Pain Loc --      Pain Edu? --      Excl. in [place]? --    No data found.  Updated Vital Signs BP 114/78 (BP Location: Right Arm)   Pulse (!) 110   Temp 98.5 F (36.9 C) (Oral)   Resp 18   SpO2 98%   Visual Acuity Right Eye Distance:   Left Eye Distance:   Bilateral Distance:    Right Eye Near:   Left Eye Near:    Bilateral Near:     Physical Exam Vitals and nursing note reviewed.  Constitutional:      General: She is not in acute distress.    Appearance: Normal appearance. She is not ill-appearing.  Skin:    Comments:  Incision wound with packing in place. Wound edges have moved  slightly further apart since initial drainage. approx 5-65mm of separation pending skin tension. Mild drainage still present. Minimally TTP in surrounding tissue. Induration has lessened to 2x 2cm. Minimal surrounding erythema.     Neurological:     General: No focal deficit present.     Mental Status: She is alert and oriented to person, place, and time.      UC Treatments / Results  Labs (all labs ordered are listed, but only abnormal results are displayed) Labs Reviewed - No data to display  EKG   Radiology No results found.  Procedures Procedures (including critical care time)  Packing removed, bandage reapplied  Medications Ordered in UC Medications - No data to display  Initial Impression / Assessment and Plan / UC Course  I have reviewed the triage vital signs and the nursing notes.  Pertinent labs & imaging results that were available during my care of the patient were reviewed by me and considered in my medical decision making (see chart for details).     #abscess wound check Patient is a 54 year old female with recent I&D of abscess on 03/21/20. Some concern for wound edge approximation as wound heals and infection resolves.Feel she will benefit from wound care follow up to ensure best outcomes, ambulatory referral sent. Continue doxycylcine. Strict return and ED precautions discussed. Wound care management discussed. Patient verbalized understanding of the plan.  Final Clinical Impressions(s) / UC Diagnoses   Final diagnoses:  Wound check, abscess     Discharge Instructions     Continue the doxycycline  Daily bandage changes  I have sent a referral to wound care, if you have not heard from them by Tuesday, call their office for appointment next week  If persistent fever, worsening pain, feeling faint, go to the ED    ED Prescriptions    None     PDMP not reviewed this encounter.     Purnell Shoemaker, PA-C 03/23/20 2352

## 2020-03-23 NOTE — ED Triage Notes (Signed)
Pt presents to UC for follow up for an abscess in the right lower abdomen. Pt states she had fever 101.0 and chills last night., fever resolved with Tylenol, last dose was at midnight. Pt denies fever and chills at this time.

## 2020-03-23 NOTE — Discharge Instructions (Addendum)
Continue the doxycycline  Daily bandage changes  I have sent a referral to wound care, if you have not heard from them by Tuesday, call their office for appointment next week  If persistent fever, worsening pain, feeling faint, go to the ED

## 2020-03-25 ENCOUNTER — Other Ambulatory Visit: Payer: Self-pay | Admitting: Physician Assistant

## 2020-03-27 ENCOUNTER — Telehealth: Payer: Self-pay

## 2020-03-27 ENCOUNTER — Ambulatory Visit (INDEPENDENT_AMBULATORY_CARE_PROVIDER_SITE_OTHER): Payer: PPO | Admitting: Internal Medicine

## 2020-03-27 ENCOUNTER — Encounter: Payer: Self-pay | Admitting: Internal Medicine

## 2020-03-27 ENCOUNTER — Other Ambulatory Visit: Payer: Self-pay

## 2020-03-27 VITALS — BP 115/80 | HR 93 | Temp 97.5°F | Ht 60.0 in | Wt 243.0 lb

## 2020-03-27 DIAGNOSIS — S31109A Unspecified open wound of abdominal wall, unspecified quadrant without penetration into peritoneal cavity, initial encounter: Secondary | ICD-10-CM

## 2020-03-27 DIAGNOSIS — R7303 Prediabetes: Secondary | ICD-10-CM | POA: Diagnosis not present

## 2020-03-27 NOTE — Patient Instructions (Signed)
[name] It was a pleasure meeting you today.  Please follow the instructions below for your wound care  1) Collagen dressing changes daily 2) followed by non stick pad 3) Cover with tape  Please follow up with me in 1 week.  Call us at [phone] with any questions or concerns  [facility] is a medical supply company.  We have sent them an order for your supplies.  Please contact them if you do not receive your supplies in the next 72hrs.  Their number is [phone] and website is [email or web address]

## 2020-03-27 NOTE — Progress Notes (Signed)
Subjective:     Patient ID: [name], female    DOB: 1966-01-16, 54 y.o.   MRN: [id number]  Chief Complaint  Patient presents with  . Abdominal wound post incision and drainage    HPI: The patient is a 54 y.o. female here for abdominal wound.  Patient states that about 2 weeks ago she had a small pimple on her abdomen that she scratched and ultimately developed into an abscess.  She went to urgent care due to the pain and it was drained and packed.  She was started on antibiotics for which she is still taking.  She currently has tenderness to the area but the pain has subsided.  She has visited urgent care a couple times since the incision and drainage and packing was placed and taken out 48 hrs ago.  She has kept it covered since.  She reports low to moderate drainage that has improved.  She denies fever/chills, purulent drainage, erythema or warmth to the area.  In the past year she has had two other areas close to the wound that started out as pimples, opened and drained.  They have since healed and did not require incision and drainage like the current wound.  Review of Systems  Hematological: Negative for adenopathy.  All other systems reviewed and are negative.    has a past medical history of Anemia, Anxiety, Asthma, Bipolar 1 disorder ([place]), Depression, Diabetes mellitus without complication ([place]), Dyspnea (2020), GERD (gastroesophageal reflux disease), Heart murmur, History of chicken pox, History of Helicobacter pylori infection, History of migraine, Hypertension, Obesity, PFO (patent foramen ovale), Renal disorder, Sleep apnea, and Vitamin D deficiency.  has a past surgical history that includes Tonsillectomy; Breast reduction surgery (1986); Colonoscopy with propofol (N/A, 12/04/2018); Esophagogastroduodenoscopy (egd) with propofol (N/A, 12/04/2018); biopsy (12/04/2018); PATENT FORAMEN OVALE(PFO) CLOSURE (2006); Wisdom tooth extraction; and Cholecystectomy (N/A, 03/23/2019).  reports  that she has never smoked. She has never used smokeless tobacco. Objective:   Vital Signs BP 115/80 (BP Location: Left Arm, Patient Position: Sitting, Cuff Size: Large)   Pulse 93   Temp (!) 97.5 F (36.4 C) (Temporal)   Ht 5' (1.524 m)   Wt 243 lb (110.2 kg)   SpO2 97%   BMI 47.46 kg/m  Vital Signs and Nursing Note Reviewed Physical Exam Skin:    Comments: Right lower abdominal wound measures: 1.0 x 2.0 x 0.4 cm Undermining of 1cm circumferentially  Sloughing in the wound bed Granulation tissue present        Assessment/Plan:     ICD-10-CM   1. Open wound of abdomen, initial encounter  S31.109A   2. Prediabetes  R73.03 Hemoglobin A1c   Assessment:  Open wound on right lower abdominal status post incision and drainage following abscess formation  Patient visited urgent care on 6/18 and had and I&D of a right lower abdominal abscess.  It has been packed on several occasions since then.  She was started on doxycycline and has 4 days remaining. Today the wound does not appear infected.  There was sloughing in the wound bed and this was debrided with curette, gauze and wound cleanser.  At this time I recommended she finish her antibiotic course and start collagen dressing changes daily.  I will see her back in one week.  Plan -in office sharp debridement with curette, gauze and wound cleanser -in office collagen dressing change - daily collagen dressing change - request for supplies to be sent by [facility]  - follow up  in one week  Assessment:  Prediabetes Patient's last hemoglobin A1C was 01/2019 and was 6.4.  She is currently taking metformin 1000mg  daily.  The patient mentioned a history of multiple infections on her abdomen in the past year and I am concerned her glucose levels may be uncontrolled. Since her hbg A1C has been over a year I recommended repeating it today.  Plan -[place], DO 03/27/2020, 10:13 AM

## 2020-03-27 NOTE — Telephone Encounter (Signed)
Faxed [facility] order for Dr. Heber [address]: collagen dressing-daily, medipore tape-daily, and non-stick pads-daily.

## 2020-03-28 DIAGNOSIS — S31109A Unspecified open wound of abdominal wall, unspecified quadrant without penetration into peritoneal cavity, initial encounter: Secondary | ICD-10-CM | POA: Diagnosis not present

## 2020-03-28 LAB — HEMOGLOBIN A1C
Est. average glucose Bld gHb Est-mCnc: 111 mg/dL
Hgb A1c MFr Bld: 5.5 % (ref 4.8–5.6)

## 2020-03-31 ENCOUNTER — Other Ambulatory Visit: Payer: Self-pay | Admitting: Family Medicine

## 2020-04-02 ENCOUNTER — Other Ambulatory Visit: Payer: Self-pay

## 2020-04-02 ENCOUNTER — Ambulatory Visit (INDEPENDENT_AMBULATORY_CARE_PROVIDER_SITE_OTHER): Payer: PPO | Admitting: Internal Medicine

## 2020-04-02 ENCOUNTER — Encounter: Payer: Self-pay | Admitting: Internal Medicine

## 2020-04-02 VITALS — BP 122/77 | HR 100 | Temp 98.0°F

## 2020-04-02 DIAGNOSIS — S31109D Unspecified open wound of abdominal wall, unspecified quadrant without penetration into peritoneal cavity, subsequent encounter: Secondary | ICD-10-CM | POA: Diagnosis not present

## 2020-04-02 NOTE — Progress Notes (Signed)
° °  Subjective:     Patient ID: [name], female    DOB: [birth year], 54 y.o.   MRN: [id number]  Chief Complaint  Patient presents with   Abdominal wound    HPI: The patient is a 54 y.o. female here for follow-up of abdominal wound.  Patient has been using collagen daily with dressing changes.  She states the wound is stable. She reports continued improvement in drainage and is now minimal.  She completed her antibiotic course. She has no complaints today.  She denies purulent drainage, increased warmth or erythema to the wound.    Review of Systems  All other systems reviewed and are negative.    has a past medical history of Anemia, Anxiety, Asthma, Bipolar 1 disorder ([place]), Depression, Diabetes mellitus without complication ([place]), Dyspnea (2020), GERD (gastroesophageal reflux disease), Heart murmur, History of chicken pox, History of Helicobacter pylori infection, History of migraine, Hypertension, Obesity, PFO (patent foramen ovale), Renal disorder, Sleep apnea, and Vitamin D deficiency.  has a past surgical history that includes Tonsillectomy; Breast reduction surgery (1986); Colonoscopy with propofol (N/A, 12/04/2018); Esophagogastroduodenoscopy (egd) with propofol (N/A, 12/04/2018); biopsy (12/04/2018); PATENT FORAMEN OVALE(PFO) CLOSURE (2006); Wisdom tooth extraction; and Cholecystectomy (N/A, 03/23/2019).  reports that she has never smoked. She has never used smokeless tobacco. Objective:   Vital Signs BP 122/77 (BP Location: Left Wrist, Patient Position: Sitting, Cuff Size: Normal)    Pulse 100    Temp 98 F (36.7 C) (Temporal)    SpO2 97%  Vital Signs and Nursing Note Reviewed Physical Exam Skin:    Comments: Right lower abdominal wound measures: 1.0 x 1.5 x 0.2 cm Undermining of 1cm on the top and right side, left side closed  Granulation tissue throughout        Assessment/Plan:     ICD-10-CM   1. Open wound of abdomen, subsequent encounter  S31.109D    Assessment:   Open wound on right lower abdominal status post incision and drainage following abscess formation  The wound shows improvement today in size and appearance, with no signs of infection today.  There is granulation tissue throughout.  There is still undermining however, this is also closing in nicely.  I recommended continuing with collagen dressing changes daily.  Will see the patient back in 2 weeks.  Plan - in office collagen dressing change - daily collagen dressing change  - follow up in 2 weeks  Assessment:  Prediabetes HgbA1C is 5.5. She is currently taking metformin 1000mg  daily.  Results discussed with the patient.   Plan -continue metformin -follow up future HgbA1C with PCP   Boyd Kerbs, DO 04/02/2020, 10:18 AM

## 2020-04-16 ENCOUNTER — Other Ambulatory Visit: Payer: Self-pay

## 2020-04-16 ENCOUNTER — Ambulatory Visit (INDEPENDENT_AMBULATORY_CARE_PROVIDER_SITE_OTHER): Payer: PPO | Admitting: Internal Medicine

## 2020-04-16 ENCOUNTER — Encounter: Payer: Self-pay | Admitting: Internal Medicine

## 2020-04-16 VITALS — BP 116/74 | HR 114 | Temp 98.0°F

## 2020-04-16 DIAGNOSIS — S31109D Unspecified open wound of abdominal wall, unspecified quadrant without penetration into peritoneal cavity, subsequent encounter: Secondary | ICD-10-CM

## 2020-04-16 NOTE — Progress Notes (Signed)
   Subjective:     Patient ID: [name], female    DOB: 08-Apr-1966, 54 y.o.   MRN: [id number]  Chief Complaint  Patient presents with  . Follow-up of abdominal wound    HPI: The patient is a 54 y.o. female here for follow-up of abdominal wound  The patient continues to use collagen daily with dressing changes.  She reports improvement in wound healing.  She reports no drainage.  She has no complaints today.  She denies acute pain, purulent drainage, increased warmth or erythema to the wound.  Review of Systems  All other systems reviewed and are negative.    has a past medical history of Anemia, Anxiety, Asthma, Bipolar 1 disorder ([place]), Depression, Diabetes mellitus without complication ([place]), Dyspnea (2020), GERD (gastroesophageal reflux disease), Heart murmur, History of chicken pox, History of Helicobacter pylori infection, History of migraine, Hypertension, Obesity, PFO (patent foramen ovale), Renal disorder, Sleep apnea, and Vitamin D deficiency.  has a past surgical history that includes Tonsillectomy; Breast reduction surgery (1986); Colonoscopy with propofol (N/A, 12/04/2018); Esophagogastroduodenoscopy (egd) with propofol (N/A, 12/04/2018); biopsy (12/04/2018); PATENT FORAMEN OVALE(PFO) CLOSURE (2006); Wisdom tooth extraction; and Cholecystectomy (N/A, 03/23/2019).  reports that she has never smoked. She has never used smokeless tobacco. Objective:   Vital Signs BP 116/74 (BP Location: Left Arm, Patient Position: Sitting, Cuff Size: Large)   Pulse (!) 114   Temp 98 F (36.7 C) (Oral)   SpO2 100%  Vital Signs and Nursing Note Reviewed Physical Exam Skin:    Comments: Right lower abdominal wound measures: 1.0 x 1.2  cm Undermining of 0.1 cm circumferentially  Granulation tissue throughout          Assessment/Plan:     ICD-10-CM   1. Open wound of abdomen, subsequent encounter  S31.109D    Assessment: Open wound on right lower abdominal status post incision and  drainage following abscess formation  The wound continues to improve in size and appearance.  No signs of infection today.  I recommended continuing with collagen dressing changes daily.  Will see the patient back in 4 weeks.  Told patient I would see sooner if she thinks it is not progressing over the next couple of weeks.  I am hoping that it will be closed at the follow up visit.  Plan - in office collagen dressing change - daily collagen dressing change - follow up in 4 weeks  Boyd Kerbs, DO 04/16/2020, 10:21 AM

## 2020-05-13 DIAGNOSIS — F3132 Bipolar disorder, current episode depressed, moderate: Secondary | ICD-10-CM | POA: Diagnosis not present

## 2020-05-13 DIAGNOSIS — F5101 Primary insomnia: Secondary | ICD-10-CM | POA: Diagnosis not present

## 2020-05-15 ENCOUNTER — Ambulatory Visit: Payer: PPO | Admitting: Internal Medicine

## 2020-05-19 ENCOUNTER — Ambulatory Visit (INDEPENDENT_AMBULATORY_CARE_PROVIDER_SITE_OTHER): Payer: PPO | Admitting: Internal Medicine

## 2020-05-19 ENCOUNTER — Other Ambulatory Visit: Payer: Self-pay

## 2020-05-19 ENCOUNTER — Encounter: Payer: Self-pay | Admitting: Internal Medicine

## 2020-05-19 VITALS — BP 100/71 | HR 104 | Temp 98.7°F

## 2020-05-19 DIAGNOSIS — S31109D Unspecified open wound of abdominal wall, unspecified quadrant without penetration into peritoneal cavity, subsequent encounter: Secondary | ICD-10-CM | POA: Diagnosis not present

## 2020-05-19 NOTE — Progress Notes (Signed)
   Subjective:     Patient ID: [name], female    DOB: 1966-03-14, 54 y.o.   MRN: [id number]  Chief Complaint  Patient presents with  . Follow-up of abdominal wound    HPI: The patient is a 54 y.o. female here for follow-up of abdominal wound.  Patient has been doing collagen daily with dressing changes since last clinic visit.  She stopped about 1 week ago when the wound was completely closed.  She denies any drainage, acute pain, increased warmth or erythema to the wound.  She is happy with the progress and has no complaints today.  Review of Systems  All other systems reviewed and are negative.    has a past medical history of Anemia, Anxiety, Asthma, Bipolar 1 disorder ([place]), Depression, Diabetes mellitus without complication ([place]), Dyspnea (2020), GERD (gastroesophageal reflux disease), Heart murmur, History of chicken pox, History of Helicobacter pylori infection, History of migraine, Hypertension, Obesity, PFO (patent foramen ovale), Renal disorder, Sleep apnea, and Vitamin D deficiency.  has a past surgical history that includes Tonsillectomy; Breast reduction surgery (1986); Colonoscopy with propofol (N/A, 12/04/2018); Esophagogastroduodenoscopy (egd) with propofol (N/A, 12/04/2018); biopsy (12/04/2018); PATENT FORAMEN OVALE(PFO) CLOSURE (2006); Wisdom tooth extraction; and Cholecystectomy (N/A, 03/23/2019).  reports that she has never smoked. She has never used smokeless tobacco. Objective:   Vital Signs BP 100/71 (BP Location: Left Arm, Patient Position: Sitting, Cuff Size: Large)   Pulse (!) 104   Temp 98.7 F (37.1 C) (Oral)   SpO2 97%  Vital Signs and Nursing Note Reviewed Physical Exam Skin:    Comments: Right lower abdominal wound closed Mild skin irritation to the surrounding wound on the lateral aspect No induration noted, no drainage or increased warmth or erythema to the previous wound site         Assessment/Plan:     ICD-10-CM   1. Open wound of abdomen,  subsequent encounter  S31.109D    Assessment: Open wound on right lower abdominal status post incision and drainage following abscess formation  The wound has healed nicely.  It is now closed with no induration or drainage noted.  There is some mild skin irritation due to tape.  I recommended she use Vaseline on the entire area and keep the previous wound site covered with a small circular Band-Aid.  She no longer needs to use tape.  She may follow-up as needed.  Plan -In office closed wound site cleaned and Band-Aid placed.  Vaseline to the irritated sites -Follow-up as needed -Vaseline daily to the previous wound site and keep covered with a circular [place], DO 05/19/2020, 10:58 AM

## 2020-06-11 ENCOUNTER — Encounter: Payer: PPO | Admitting: Physician Assistant

## 2020-06-24 DIAGNOSIS — N9412 Deep dyspareunia: Secondary | ICD-10-CM | POA: Diagnosis not present

## 2020-06-24 DIAGNOSIS — Z118 Encounter for screening for other infectious and parasitic diseases: Secondary | ICD-10-CM | POA: Diagnosis not present

## 2020-06-24 DIAGNOSIS — N882 Stricture and stenosis of cervix uteri: Secondary | ICD-10-CM | POA: Diagnosis not present

## 2020-06-24 DIAGNOSIS — Z113 Encounter for screening for infections with a predominantly sexual mode of transmission: Secondary | ICD-10-CM | POA: Diagnosis not present

## 2020-06-24 DIAGNOSIS — N760 Acute vaginitis: Secondary | ICD-10-CM | POA: Diagnosis not present

## 2020-06-24 DIAGNOSIS — Z114 Encounter for screening for human immunodeficiency virus [HIV]: Secondary | ICD-10-CM | POA: Diagnosis not present

## 2020-06-24 DIAGNOSIS — N9411 Superficial (introital) dyspareunia: Secondary | ICD-10-CM | POA: Diagnosis not present

## 2020-06-24 DIAGNOSIS — Z1159 Encounter for screening for other viral diseases: Secondary | ICD-10-CM | POA: Diagnosis not present

## 2020-07-02 ENCOUNTER — Other Ambulatory Visit: Payer: Self-pay | Admitting: Physician Assistant

## 2020-07-04 ENCOUNTER — Other Ambulatory Visit: Payer: Self-pay | Admitting: Physician Assistant

## 2020-07-08 DIAGNOSIS — Z1151 Encounter for screening for human papillomavirus (HPV): Secondary | ICD-10-CM | POA: Diagnosis not present

## 2020-07-08 DIAGNOSIS — N882 Stricture and stenosis of cervix uteri: Secondary | ICD-10-CM | POA: Diagnosis not present

## 2020-07-08 DIAGNOSIS — Z124 Encounter for screening for malignant neoplasm of cervix: Secondary | ICD-10-CM | POA: Diagnosis not present

## 2020-07-16 DIAGNOSIS — R87615 Unsatisfactory cytologic smear of cervix: Secondary | ICD-10-CM | POA: Diagnosis not present

## 2020-08-26 DIAGNOSIS — F5101 Primary insomnia: Secondary | ICD-10-CM | POA: Diagnosis not present

## 2020-08-26 DIAGNOSIS — F3132 Bipolar disorder, current episode depressed, moderate: Secondary | ICD-10-CM | POA: Diagnosis not present

## 2020-09-05 ENCOUNTER — Ambulatory Visit (INDEPENDENT_AMBULATORY_CARE_PROVIDER_SITE_OTHER): Payer: PPO

## 2020-09-05 DIAGNOSIS — Z0000 Encounter for general adult medical examination without abnormal findings: Secondary | ICD-10-CM

## 2020-09-05 NOTE — Progress Notes (Addendum)
Virtual Visit via Telephone Note  I connected with  [name] on 09/05/20 at  8:45 AM EST by telephone and verified that I am speaking with the correct person using two identifiers.  Location: Patient: Home Provider: Office Persons participating in the virtual visit: patient/Nurse Health Advisor   I discussed the limitations, risks, security and privacy concerns of performing an evaluation and management service by telephone and the availability of in person appointments. The patient expressed understanding and agreed to proceed.  Interactive audio and video telecommunications were attempted between this nurse and patient, however failed, due to patient having technical difficulties OR patient did not have access to video capability.  We continued and completed visit with audio only.  Some vital signs may be absent or patient reported.   Willette Brace, LPN    Subjective:   [name] is a 54 y.o. female who presents for Medicare Annual (Subsequent) preventive examination.  Review of Systems     Cardiac Risk Factors include: hypertension;obesity (BMI >30kg/m2)     Objective:    There were no vitals filed for this visit. There is no height or weight on file to calculate BMI.  Advanced Directives 09/05/2020 07/30/2019 03/20/2019 02/20/2019 12/04/2018 07/06/2018 03/31/2017  Does Patient Have a Medical Advance Directive? No No No No No No Yes  Type of Advance Directive - - - - - - Living will  Would patient like information on creating a medical advance directive? Yes (MAU/Ambulatory/Procedural Areas - Information given) Yes (MAU/Ambulatory/Procedural Areas - Information given) No - Patient declined No - Patient declined No - Patient declined Yes (MAU/Ambulatory/Procedural Areas - Information given) -    Current Medications (verified) Outpatient Encounter Medications as of 09/05/2020  Medication Sig  . albuterol (VENTOLIN HFA) 108 (90 Base) MCG/ACT inhaler Inhale 1-2 puffs into  the lungs every 6 (six) hours as needed for wheezing or shortness of breath.  . ARIPiprazole (ABILIFY) 10 MG tablet Take 10 mg by mouth daily.   . ferrous sulfate 325 (65 FE) MG tablet Take 325 mg by mouth daily with breakfast.  . lisinopril-hydrochlorothiazide (ZESTORETIC) 20-25 MG tablet TAKE 1 TABLET BY MOUTH EVERY DAY  . metFORMIN (GLUCOPHAGE) 1000 MG tablet TAKE ONE TABLET BY MOUTH DAILY WITH BREAKFAST  . traZODone (DESYREL) 50 MG tablet Take 50 mg by mouth at bedtime.  . Diclofenac Sodium (PENNSAID) 2 % SOLN Place 1 application onto the skin 2 (two) times daily. (Patient taking differently: Place 1 application onto the skin daily as needed (pain). )  . pantoprazole (PROTONIX) 40 MG tablet Take 1 tablet (40 mg total) by mouth daily. (Patient not taking: Reported on 09/05/2020)  . [DISCONTINUED] lamoTRIgine (LAMICTAL) 25 MG tablet Take 25 mg by mouth daily.    No facility-administered encounter medications on file as of 09/05/2020.    Allergies (verified) Patient has no known allergies.   History: Past Medical History:  Diagnosis Date  . Anemia   . Anxiety   . Asthma   . Bipolar 1 disorder ([place])   . Depression   . Diabetes mellitus without complication ([place])   . Dyspnea 2020   with exersion  . GERD (gastroesophageal reflux disease)   . Heart murmur    HISTORY OF CLOSED IN 2006  . History of chicken pox   . History of Helicobacter pylori infection   . History of migraine   . Hypertension   . Obesity   . PFO (patent foramen ovale)   . Renal disorder   .  Sleep apnea    does not use cpap  . Vitamin D deficiency    Past Surgical History:  Procedure Laterality Date  . BIOPSY  12/04/2018   Procedure: BIOPSY;  Surgeon: Thornton Park, MD;  Location: [facility];  Service: Gastroenterology;;  EGD and Colon  . BREAST REDUCTION SURGERY  1986  . CHOLECYSTECTOMY N/A 03/23/2019   Procedure: LAPAROSCOPIC CHOLECYSTECTOMY;  Surgeon: Clovis Riley, MD;  Location: [facility];  Service:  General;  Laterality: N/A;  . COLONOSCOPY WITH PROPOFOL N/A 12/04/2018   Procedure: COLONOSCOPY WITH PROPOFOL;  Surgeon: Thornton Park, MD;  Location: [facility];  Service: Gastroenterology;  Laterality: N/A;  . ESOPHAGOGASTRODUODENOSCOPY (EGD) WITH PROPOFOL N/A 12/04/2018   Procedure: ESOPHAGOGASTRODUODENOSCOPY (EGD) WITH PROPOFOL;  Surgeon: Thornton Park, MD;  Location: [facility];  Service: Gastroenterology;  Laterality: N/A;  . PATENT FORAMEN OVALE(PFO) CLOSURE  2006  . TONSILLECTOMY    . WISDOM TOOTH EXTRACTION     Family History  Problem Relation Age of Onset  . Diabetes Mother   . Hypertension Mother   . Stroke Father   . Diabetes Maternal Grandmother   . CVA Maternal Grandmother   . Diabetes Maternal Grandfather   . Colon cancer Paternal Grandmother   . Diabetes Paternal Grandfather   . Breast cancer Maternal Aunt   . Asthma Other   . Cancer Other   . COPD Other   . Esophageal cancer Neg Hx   . Rectal cancer Neg Hx   . Stomach cancer Neg Hx   . Liver disease Neg Hx    Social History   Socioeconomic History  . Marital status: Single    Spouse name: Not on file  . Number of children: Not on file  . Years of education: Not on file  . Highest education level: Not on file  Occupational History  . Occupation: disability  Tobacco Use  . Smoking status: Never Smoker  . Smokeless tobacco: Never Used  Vaping Use  . Vaping Use: Never used  Substance and Sexual Activity  . Alcohol use: Yes    Alcohol/week: 1.0 - 2.0 standard drink    Types: 1 - 2 Glasses of wine per week  . Drug use: No  . Sexual activity: Never    Birth control/protection: None  Other Topics Concern  . Not on file  Social History Narrative   Lives in [place], was living in [place], [place] for 20 years   No children, not married   Single   Family is nearby, mom and sister nearby   Currently on disability: 4 years ago she had an episode of psychosis (states that she didn't sleep for ~6-8  days prior to) and was in and out of mental institution for 6 months, lost her job and had to move back to [place] to be near family   Social Determinants of Health   Financial Resource Strain: [place]   . Difficulty of Paying Living Expenses: Not hard at all  Food Insecurity: No Food Insecurity  . Worried About Charity fundraiser in the Last Year: Never true  . Ran Out of Food in the Last Year: Never true  Transportation Needs: No Transportation Needs  . Lack of Transportation (Medical): No  . Lack of Transportation (Non-Medical): No  Physical Activity: Insufficiently Active  . Days of Exercise per Week: 3 days  . Minutes of Exercise per Session: 30 min  Stress: No Stress Concern Present  . Feeling of Stress : Only a little  Social Connections: Socially Isolated  . Frequency of Communication with Friends and Family: More than three times a week  . Frequency of Social Gatherings with Friends and Family: Once a week  . Attends Religious Services: Never  . Active Member of Clubs or Organizations: No  . Attends Archivist Meetings: Never  . Marital Status: Never married    Tobacco Counseling Counseling given: Not Answered   Clinical Intake:  Pre-visit preparation completed: Yes  Pain : No/denies pain     BMI - recorded: 47.46 Nutritional Status: BMI > 30  Obese Nutritional Risks: None Diabetes: No  How often do you need to have someone help you when you read instructions, pamphlets, or other written materials from your doctor or pharmacy?: 1 - Never  Diabetic?No  Interpreter Needed?: No  Information entered by :: Charlott Rakes, LPN   Activities of Daily Living In your present state of health, do you have any difficulty performing the following activities: 09/05/2020  Hearing? N  Vision? N  Difficulty concentrating or making decisions? Y  Comment concentrating and making decisions can be difficult  at times  Walking or climbing stairs? N  Dressing or  bathing? N  Doing errands, shopping? N  Preparing Food and eating ? N  Using the Toilet? N  In the past six months, have you accidently leaked urine? N  Do you have problems with loss of bowel control? N  Managing your Medications? N  Managing your Finances? N  Housekeeping or managing your Housekeeping? N  Some recent data might be hidden    Patient Care Team: [name], [place] as PCP - General (Physician Assistant) Thornton Park, MD as Consulting Physician (Gastroenterology) Daron Offer, Richard Miu, MD as Consulting Physician (Psychiatry)  Indicate any recent Medical Services you may have received from other than [facility] providers in the past year (date may be approximate).     Assessment:   This is a routine wellness examination for [place].  Hearing/Vision screen  Hearing Screening   125Hz  250Hz  500Hz  1000Hz  2000Hz  3000Hz  4000Hz  6000Hz  8000Hz   Right ear:           Left ear:           Comments: Pt denies any hearing issues   Vision Screening Comments: Pt follows up annually for eye care with [facility] and [facility]ci[facility]  Dietary issues and exercise activities discussed: Current Exercise Habits: Home exercise routine, Type of exercise: treadmill, Time (Minutes): 30, Frequency (Times/Week): 3, Weekly Exercise (Minutes/Week): 90, Exercise limited by: respiratory conditions(s) (can hinder at times)  Goals    . Patient Stated     Lose 38 lbs       Depression Screen PHQ 2/9 Scores 09/05/2020 07/30/2019 07/06/2018 04/10/2018 03/31/2017  PHQ - 2 Score 1 0 1 3 3   PHQ- 9 Score - - - 9 10    Fall Risk Fall Risk  09/05/2020 07/30/2019 07/06/2018  Falls in the past year? 0 0 No  Number falls in past yr: 0 - -  Injury with Fall? 0 0 -  Risk for fall due to : Impaired vision - -  Follow up Falls prevention discussed Falls evaluation completed;Education provided;Falls prevention discussed -    Any stairs in or around the home? No  If so, are there any without handrails? No    Home free of loose throw rugs in walkways, pet beds, electrical cords, etc? Yes  Adequate lighting in your home to reduce risk of falls? Yes  ASSISTIVE DEVICES UTILIZED TO PREVENT FALLS:  Life alert? No  Use of a cane, walker or w/c? No  Grab bars in the bathroom? No  Shower chair or bench in shower? No  Elevated toilet seat or a handicapped toilet? No   TIMED UP AND GO:  Was the test performed? No .     Cognitive Function:     6CIT Screen 09/05/2020  What Year? 0 points  What month? 0 points  Count back from 20 0 points  Months in reverse 0 points  Repeat phrase 0 points    Immunizations Immunization History  Administered Date(s) Administered  . PFIZER SARS-COV-2 Vaccination 01/05/2020, 01/29/2020  . Tdap 05/12/2018    TDAP status: Up to date Flu Vaccine status: Declined, Education has been provided regarding the importance of this vaccine but patient still declined. Advised may receive this vaccine at local pharmacy or Health Dept. Aware to provide a copy of the vaccination record if obtained from local pharmacy or Health Dept. Verbalized acceptance and understanding.  Covid-19 vaccine status: Completed vaccines  Qualifies for Shingles Vaccine? Yes   Zostavax completed No   Shingrix Completed?: No.    Education has been provided regarding the importance of this vaccine. Patient has been advised to call insurance company to determine out of pocket expense if they have not yet received this vaccine. Advised may also receive vaccine at local pharmacy or Health Dept. Verbalized acceptance and understanding.  Screening Tests Health Maintenance  Topic Date Due  . Hepatitis C Screening  10/06/2020 (Originally June 22, 1966)  . MAMMOGRAM  10/09/2021  . COLONOSCOPY  12/03/2021  . PAP SMEAR-Modifier  07/25/2023  . TETANUS/TDAP  05/12/2028  . COVID-19 Vaccine  Completed  . HIV Screening  Completed  . INFLUENZA VACCINE  Discontinued    Health Maintenance  There are no  preventive care reminders to display for this patient.  Colorectal cancer screening: Completed 12/04/18. Repeat every 3 years Mammogram status: Completed 10/10/19. Repeat every year   Additional Screening:  Hepatitis C Screening: does qualify  Vision Screening: Recommended annual ophthalmology exams for early detection of glaucoma and other disorders of the eye. Is the patient up to date with their annual eye exam?  Yes  Who is the provider or what is the name of the office in which the patient attends annual eye exams? [facility]     Dental Screening: Recommended annual dental exams for proper oral hygiene  Community Resource Referral / Chronic Care Management: CRR required this visit?  No   CCM required this visit?  No      Plan:     I have personally reviewed and noted the following in the patient's chart:   . Medical and social history . Use of alcohol, tobacco or illicit drugs  . Current medications and supplements . Functional ability and status . Nutritional status . Physical activity . Advanced directives . List of other physicians . Hospitalizations, surgeries, and ER visits in previous 12 months . Vitals . Screenings to include cognitive, depression, and falls . Referrals and appointments  In addition, I have reviewed and discussed with patient certain preventive protocols, quality metrics, and best practice recommendations. A written personalized care plan for preventive services as well as general preventive health recommendations were provided to patient.     Willette Brace, LPN   17/03/1606   Nurse Notes: None  I have reviewed documentation for AWV and Advance Care planning provided by [facility], I agree with documentation,  I was immediately available for any questions. [name], [place]

## 2020-09-05 NOTE — Patient Instructions (Addendum)
[name] , Thank you for taking time to come for your Medicare Wellness Visit. I appreciate your ongoing commitment to your health goals. Please review the following plan we discussed and let me know if I can assist you in the future.   Screening recommendations/referrals: Colonoscopy: Done 12/04/18 Mammogram: Done 10/10/19 Recommended yearly ophthalmology/optometry visit for glaucoma screening and checkup Recommended yearly dental visit for hygiene and checkup  Vaccinations: Influenza vaccine: Declined  Tdap vaccine: Done 05/12/2018 Shingles vaccine: Shingrix discussed. Please contact your pharmacy for coverage information.   Covid-19: Completed 01/05/20 & 01/29/20  Advanced directives: Advance directive discussed with you today. I have provided a copy for you to complete at home and have notarized. Once this is complete please bring a copy in to our office so we can scan it into your chart.  Conditions/risks identified: Lose 60 LBS   Next appointment: Follow up in one year for your annual wellness visit.   Preventive Care 40-64 Years, Female Preventive care refers to lifestyle choices and visits with your health care provider that can promote health and wellness. What does preventive care include?  A yearly physical exam. This is also called an annual well check.  Dental exams once or twice a year.  Routine eye exams. Ask your health care provider how often you should have your eyes checked.  Personal lifestyle choices, including:  Daily care of your teeth and gums.  Regular physical activity.  Eating a healthy diet.  Avoiding tobacco and drug use.  Limiting alcohol use.  Practicing safe sex.  Taking low-dose aspirin daily starting at age 32.  Taking vitamin and mineral supplements as recommended by your health care provider. What happens during an annual well check? The services and screenings done by your health care provider during your annual well check will depend on  your age, overall health, lifestyle risk factors, and family history of disease. Counseling  Your health care provider may ask you questions about your:  Alcohol use.  Tobacco use.  Drug use.  Emotional well-being.  Home and relationship well-being.  Sexual activity.  Eating habits.  Work and work Statistician.  Method of birth control.  Menstrual cycle.  Pregnancy history. Screening  You may have the following tests or measurements:  Height, weight, and BMI.  Blood pressure.  Lipid and cholesterol levels. These may be checked every 5 years, or more frequently if you are over 27 years old.  Skin check.  Lung cancer screening. You may have this screening every year starting at age 85 if you have a 30-pack-year history of smoking and currently smoke or have quit within the past 15 years.  Fecal occult blood test (FOBT) of the stool. You may have this test every year starting at age 15.  Flexible sigmoidoscopy or colonoscopy. You may have a sigmoidoscopy every 5 years or a colonoscopy every 10 years starting at age 78.  Hepatitis C blood test.  Hepatitis B blood test.  Sexually transmitted disease (STD) testing.  Diabetes screening. This is done by checking your blood sugar (glucose) after you have not eaten for a while (fasting). You may have this done every 1-3 years.  Mammogram. This may be done every 1-2 years. Talk to your health care provider about when you should start having regular mammograms. This may depend on whether you have a family history of breast cancer.  BRCA-related cancer screening. This may be done if you have a family history of breast, ovarian, tubal, or peritoneal cancers.  Pelvic exam and Pap test. This may be done every 3 years starting at age 59. Starting at age 105, this may be done every 5 years if you have a Pap test in combination with an HPV test.  Bone density scan. This is done to screen for osteoporosis. You may have this scan if  you are at high risk for osteoporosis. Discuss your test results, treatment options, and if necessary, the need for more tests with your health care provider. Vaccines  Your health care provider may recommend certain vaccines, such as:  Influenza vaccine. This is recommended every year.  Tetanus, diphtheria, and acellular pertussis (Tdap, Td) vaccine. You may need a Td booster every 10 years.  Zoster vaccine. You may need this after age 81.  Pneumococcal 13-valent conjugate (PCV13) vaccine. You may need this if you have certain conditions and were not previously vaccinated.  Pneumococcal polysaccharide (PPSV23) vaccine. You may need one or two doses if you smoke cigarettes or if you have certain conditions. Talk to your health care provider about which screenings and vaccines you need and how often you need them. This information is not intended to replace advice given to you by your health care provider. Make sure you discuss any questions you have with your health care provider. Document Released: 10/17/2015 Document Revised: 06/09/2016 Document Reviewed: 07/22/2015 Elsevier Interactive Patient Education  2017 [place] Prevention in the Home Falls can cause injuries. They can happen to people of all ages. There are many things you can do to make your home safe and to help prevent falls. What can I do on the outside of my home?  Regularly fix the edges of walkways and driveways and fix any cracks.  Remove anything that might make you trip as you walk through a door, such as a raised step or threshold.  Trim any bushes or trees on the path to your home.  Use bright outdoor lighting.  Clear any walking paths of anything that might make someone trip, such as rocks or tools.  Regularly check to see if handrails are loose or broken. Make sure that both sides of any steps have handrails.  Any raised decks and porches should have guardrails on the edges.  Have any  leaves, snow, or ice cleared regularly.  Use sand or salt on walking paths during winter.  Clean up any spills in your garage right away. This includes oil or grease spills. What can I do in the bathroom?  Use night lights.  Install grab bars by the toilet and in the tub and shower. Do not use towel bars as grab bars.  Use non-skid mats or decals in the tub or shower.  If you need to sit down in the shower, use a plastic, non-slip stool.  Keep the floor dry. Clean up any water that spills on the floor as soon as it happens.  Remove soap buildup in the tub or shower regularly.  Attach bath mats securely with double-sided non-slip rug tape.  Do not have throw rugs and other things on the floor that can make you trip. What can I do in the bedroom?  Use night lights.  Make sure that you have a light by your bed that is easy to reach.  Do not use any sheets or blankets that are too big for your bed. They should not hang down onto the floor.  Have a firm chair that has side arms. You can use this for  support while you get dressed.  Do not have throw rugs and other things on the floor that can make you trip. What can I do in the kitchen?  Clean up any spills right away.  Avoid walking on wet floors.  Keep items that you use a lot in easy-to-reach places.  If you need to reach something above you, use a strong step stool that has a grab bar.  Keep electrical cords out of the way.  Do not use floor polish or wax that makes floors slippery. If you must use wax, use non-skid floor wax.  Do not have throw rugs and other things on the floor that can make you trip. What can I do with my stairs?  Do not leave any items on the stairs.  Make sure that there are handrails on both sides of the stairs and use them. Fix handrails that are broken or loose. Make sure that handrails are as long as the stairways.  Check any carpeting to make sure that it is firmly attached to the stairs.  Fix any carpet that is loose or worn.  Avoid having throw rugs at the top or bottom of the stairs. If you do have throw rugs, attach them to the floor with carpet tape.  Make sure that you have a light switch at the top of the stairs and the bottom of the stairs. If you do not have them, ask someone to add them for you. What else can I do to help prevent falls?  Wear shoes that:  Do not have high heels.  Have rubber bottoms.  Are comfortable and fit you well.  Are closed at the toe. Do not wear sandals.  If you use a stepladder:  Make sure that it is fully opened. Do not climb a closed stepladder.  Make sure that both sides of the stepladder are locked into place.  Ask someone to hold it for you, if possible.  Clearly mark and make sure that you can see:  Any grab bars or handrails.  First and last steps.  Where the edge of each step is.  Use tools that help you move around (mobility aids) if they are needed. These include:  Canes.  Walkers.  Scooters.  Crutches.  Turn on the lights when you go into a dark area. Replace any light bulbs as soon as they burn out.  Set up your furniture so you have a clear path. Avoid moving your furniture around.  If any of your floors are uneven, fix them.  If there are any pets around you, be aware of where they are.  Review your medicines with your doctor. Some medicines can make you feel dizzy. This can increase your chance of falling. Ask your doctor what other things that you can do to help prevent falls. This information is not intended to replace advice given to you by your health care provider. Make sure you discuss any questions you have with your health care provider. Document Released: 07/17/2009 Document Revised: 02/26/2016 Document Reviewed: 10/25/2014 Elsevier Interactive Patient Education  2017 [employer].

## 2020-10-12 ENCOUNTER — Other Ambulatory Visit: Payer: Self-pay | Admitting: Physician Assistant

## 2020-10-29 ENCOUNTER — Other Ambulatory Visit: Payer: Self-pay | Admitting: Physician Assistant

## 2021-01-25 ENCOUNTER — Other Ambulatory Visit: Payer: Self-pay | Admitting: Physician Assistant

## 2021-02-02 ENCOUNTER — Telehealth: Payer: Self-pay

## 2021-02-02 ENCOUNTER — Ambulatory Visit (INDEPENDENT_AMBULATORY_CARE_PROVIDER_SITE_OTHER): Payer: PPO | Admitting: Physician Assistant

## 2021-02-02 ENCOUNTER — Encounter: Payer: Self-pay | Admitting: Physician Assistant

## 2021-02-02 ENCOUNTER — Telehealth: Payer: PPO | Admitting: Physician Assistant

## 2021-02-02 ENCOUNTER — Other Ambulatory Visit: Payer: Self-pay

## 2021-02-02 ENCOUNTER — Other Ambulatory Visit: Payer: Self-pay | Admitting: Physician Assistant

## 2021-02-02 VITALS — BP 141/80 | HR 101 | Ht 60.0 in | Wt 232.0 lb

## 2021-02-02 VITALS — BP 128/80 | HR 86 | Temp 97.9°F | Ht 60.0 in | Wt 236.0 lb

## 2021-02-02 DIAGNOSIS — E785 Hyperlipidemia, unspecified: Secondary | ICD-10-CM | POA: Diagnosis not present

## 2021-02-02 DIAGNOSIS — I10 Essential (primary) hypertension: Secondary | ICD-10-CM | POA: Diagnosis not present

## 2021-02-02 DIAGNOSIS — F319 Bipolar disorder, unspecified: Secondary | ICD-10-CM | POA: Diagnosis not present

## 2021-02-02 DIAGNOSIS — E8881 Metabolic syndrome: Secondary | ICD-10-CM

## 2021-02-02 DIAGNOSIS — J302 Other seasonal allergic rhinitis: Secondary | ICD-10-CM

## 2021-02-02 LAB — COMPREHENSIVE METABOLIC PANEL
ALT: 9 U/L (ref 0–35)
AST: 10 U/L (ref 0–37)
Albumin: 3.9 g/dL (ref 3.5–5.2)
Alkaline Phosphatase: 70 U/L (ref 39–117)
BUN: 11 mg/dL (ref 6–23)
CO2: 29 mEq/L (ref 19–32)
Calcium: 9.1 mg/dL (ref 8.4–10.5)
Chloride: 101 mEq/L (ref 96–112)
Creatinine, Ser: 0.81 mg/dL (ref 0.40–1.20)
GFR: 83.07 mL/min (ref >60.00)
Glucose, Bld: 84 mg/dL (ref 70–99)
Potassium: 4.1 mEq/L (ref 3.5–5.1)
Sodium: 139 mEq/L (ref 135–145)
Total Bilirubin: 0.5 mg/dL (ref 0.2–1.2)
Total Protein: 7.4 g/dL (ref 6.0–8.3)

## 2021-02-02 LAB — HEMOGLOBIN A1C: Hgb A1c MFr Bld: 5.5 % (ref 4.6–6.5)

## 2021-02-02 LAB — LIPID PANEL
Cholesterol: 165 mg/dL (ref 0–200)
HDL: 37.2 mg/dL — ABNORMAL LOW (ref >39.00)
LDL Cholesterol: 105 mg/dL — ABNORMAL HIGH (ref 0–99)
NonHDL: 128.04
Total CHOL/HDL Ratio: 4
Triglycerides: 116 mg/dL (ref 0.0–149.0)
VLDL: 23.2 mg/dL (ref 0.0–40.0)

## 2021-02-02 MED ORDER — TRAZODONE HCL 50 MG PO TABS: 50.0000 mg | ORAL_TABLET | Freq: Every day | ORAL | 1 refills | Status: DC

## 2021-02-02 MED ORDER — LISINOPRIL-HYDROCHLOROTHIAZIDE 20-25 MG PO TABS: 1.0000 | ORAL_TABLET | Freq: Every day | ORAL | 3 refills | Status: DC

## 2021-02-02 MED ORDER — METFORMIN HCL 500 MG PO TABS
500.0000 mg | ORAL_TABLET | Freq: Two times a day (BID) | ORAL | 3 refills | Status: DC
Start: 2021-02-02 — End: 2021-07-30

## 2021-02-02 MED ORDER — ALBUTEROL SULFATE HFA 108 (90 BASE) MCG/ACT IN AERS: 1.0000 | INHALATION_SPRAY | Freq: Four times a day (QID) | RESPIRATORY_TRACT | 2 refills | Status: AC | PRN

## 2021-02-02 MED ORDER — ARIPIPRAZOLE 10 MG PO TABS: 10.0000 mg | ORAL_TABLET | Freq: Every day | ORAL | 1 refills | Status: DC

## 2021-02-02 NOTE — Telephone Encounter (Signed)
Pt returned call. Relayed Lisa's message. Pt understood.

## 2021-02-02 NOTE — Telephone Encounter (Signed)
Left the patient a VM requesting a call back to give her the number to call for [facility] and set up an appointment-the number to call is [phone] to make an appointment and her referral has already been faxed

## 2021-02-02 NOTE — Progress Notes (Signed)
Appointment canceled and rescheduled for later today for in-person.   [name]

## 2021-02-02 NOTE — Patient Instructions (Signed)
It was great to see you!  You are doing so well, keep up the good work!  I've refilled abilify and trazodone x 6 months. I will put referral in for Dr. Altamese Delafield to see you in the meantime.  Maintain current blood pressure medication for now. If you continue to lose weight, we may need to decrease dose. Keep an eye on your blood pressure at home.  Lastly, I will update your A1c and we may reduce/stop metformin based on results.  Let's follow-up in 6 - 12 months, sooner if you have concerns.  Take care,  [name] PA-C

## 2021-02-02 NOTE — Progress Notes (Signed)
[name] is a 55 y.o. female is here for follow up.  I acted as a Education administrator for [employer], PA-C Anselmo Pickler, LPN   History of Present Illness:   Chief Complaint  Patient presents with  . Hypertension  . Diabetes    HPI  Hypertension Pt for follow up on blood pressure, currently taking Lisinopril-HCTZ 20-25 mg daily, She is checking blood pressure systolic 196-222, diastolic 97-98. Pt denies headaches, dizziness, blurred vision, chest pain, SOB or lower leg edema. Denies excessive caffeine intake, stimulant usage, excessive alcohol intake or increase in salt consumption.  She has lost around 80 lb with diet and exercise and hopeful to get to 200 lb.   Insulin Resistance Currently taking Metformin 1000 mg daily. Compliant with medication. Denies symptoms of hypoglycemia or hyperglycemia and no neuropathy.  Wt Readings from Last 4 Encounters:  02/02/21 236 lb (107 kg)  02/02/21 232 lb (105.2 kg)  03/27/20 243 lb (110.2 kg)  07/30/19 270 lb (122.5 kg)    Bipolar 1 Disorder Her psychiatrist left his practice and she is still looking for a replacement. She has been very well controlled on abilify 10 mg daliy and trazodone 50 mg daily. Denies SI/HI.   HLD Hx of HLD in the past. Currently not on medications. Due for repeat blood work today. Continues to work on healthy eating with goal of ongoing weight loss.   Allergic rhinitis At times during seasonal changes, she requires prn use of albuterol. Hasn't used much recently but is wondering if she can have refill on hand to make sure that she doesn't develop any significant issues. Denies current chest pain, chest tightness, wheezing, SOB.  Health Maintenance Due  Topic Date Due  . Hepatitis C Screening  Never done    Past Medical History:  Diagnosis Date  . Anemia   . Anxiety   . Asthma   . Bipolar 1 disorder ([place])   . Depression   . Diabetes mellitus without complication ([place])   . Dyspnea 2020   with  exersion  . GERD (gastroesophageal reflux disease)   . Heart murmur    HISTORY OF CLOSED IN 2006  . History of chicken pox   . History of Helicobacter pylori infection   . History of migraine   . Hypertension   . Obesity   . PFO (patent foramen ovale)   . Renal disorder   . Sleep apnea    does not use cpap  . Vitamin D deficiency      Social History   Tobacco Use  . Smoking status: Never Smoker  . Smokeless tobacco: Never Used  Vaping Use  . Vaping Use: Never used  Substance Use Topics  . Alcohol use: Yes    Alcohol/week: 1.0 - 2.0 standard drink    Types: 1 - 2 Glasses of wine per week  . Drug use: No    Past Surgical History:  Procedure Laterality Date  . BIOPSY  12/04/2018   Procedure: BIOPSY;  Surgeon: Thornton Park, MD;  Location: [facility];  Service: Gastroenterology;;  EGD and Colon  . BREAST REDUCTION SURGERY  1986  . CHOLECYSTECTOMY N/A 03/23/2019   Procedure: LAPAROSCOPIC CHOLECYSTECTOMY;  Surgeon: Clovis Riley, MD;  Location: [facility];  Service: General;  Laterality: N/A;  . COLONOSCOPY WITH PROPOFOL N/A 12/04/2018   Procedure: COLONOSCOPY WITH PROPOFOL;  Surgeon: Thornton Park, MD;  Location: [facility];  Service: Gastroenterology;  Laterality: N/A;  . ESOPHAGOGASTRODUODENOSCOPY (EGD) WITH PROPOFOL N/A 12/04/2018  Procedure: ESOPHAGOGASTRODUODENOSCOPY (EGD) WITH PROPOFOL;  Surgeon: Thornton Park, MD;  Location: [facility];  Service: Gastroenterology;  Laterality: N/A;  . PATENT FORAMEN OVALE(PFO) CLOSURE  2006  . TONSILLECTOMY    . WISDOM TOOTH EXTRACTION      Family History  Problem Relation Age of Onset  . Diabetes Mother   . Hypertension Mother   . Stroke Father   . Diabetes Maternal Grandmother   . CVA Maternal Grandmother   . Diabetes Maternal Grandfather   . Colon cancer Paternal Grandmother   . Diabetes Paternal Grandfather   . Breast cancer Maternal Aunt   . Asthma Other   . Cancer Other   . COPD Other   . Esophageal  cancer Neg Hx   . Rectal cancer Neg Hx   . Stomach cancer Neg Hx   . Liver disease Neg Hx     PMHx, SurgHx, SocialHx, FamHx, Medications, and Allergies were reviewed in the Visit Navigator and updated as appropriate.   Patient Active Problem List   Diagnosis Date Noted  . Polyp of colon   . Knee pain, chronic 04/10/2018  . Insulin resistance 04/10/2018  . Vitamin D deficiency   . Sleep apnea 03/31/2017  . Morbid obesity ([place]) 03/31/2017  . Hypertension 03/31/2017  . Bipolar 1 disorder ([place]) 03/31/2017    Social History   Tobacco Use  . Smoking status: Never Smoker  . Smokeless tobacco: Never Used  Vaping Use  . Vaping Use: Never used  Substance Use Topics  . Alcohol use: Yes    Alcohol/week: 1.0 - 2.0 standard drink    Types: 1 - 2 Glasses of wine per week  . Drug use: No    Current Medications and Allergies:    Current Outpatient Medications:  .  ferrous sulfate 325 (65 FE) MG tablet, Take 325 mg by mouth daily with breakfast., Disp: , Rfl:  .  metFORMIN (GLUCOPHAGE) 1000 MG tablet, TAKE ONE TABLET BY MOUTH DAILY WITH BREAKFAST, Disp: 90 tablet, Rfl: 0 .  albuterol (VENTOLIN HFA) 108 (90 Base) MCG/ACT inhaler, Inhale 1-2 puffs into the lungs every 6 (six) hours as needed for wheezing or shortness of breath., Disp: 18 g, Rfl: 2 .  ARIPiprazole (ABILIFY) 10 MG tablet, Take 1 tablet (10 mg total) by mouth daily., Disp: 90 tablet, Rfl: 1 .  lisinopril-hydrochlorothiazide (ZESTORETIC) 20-25 MG tablet, Take 1 tablet by mouth daily., Disp: 90 tablet, Rfl: 3 .  traZODone (DESYREL) 50 MG tablet, Take 1 tablet (50 mg total) by mouth at bedtime., Disp: 90 tablet, Rfl: 1  No Known Allergies  Review of Systems   ROS Negative unless otherwise specified per HPI.  Vitals:   Vitals:   02/02/21 1054  BP: 128/80  Pulse: 86  Temp: 97.9 F (36.6 C)  TempSrc: Temporal  SpO2: 95%  Weight: 236 lb (107 kg)  Height: 5' (1.524 m)     Body mass index is 46.09  kg/m.   Physical Exam:    Physical Exam Vitals and nursing note reviewed.  Constitutional:      General: She is not in acute distress.    Appearance: She is well-developed. She is not ill-appearing or toxic-appearing.  Cardiovascular:     Rate and Rhythm: Normal rate and regular rhythm.     Pulses: Normal pulses.     Heart sounds: Normal heart sounds, S1 normal and S2 normal.     Comments: No LE edema Pulmonary:     Effort: Pulmonary effort is normal.  Breath sounds: Normal breath sounds.  Skin:    General: Skin is warm and dry.  Neurological:     Mental Status: She is alert.     GCS: GCS eye subscore is 4. GCS verbal subscore is 5. GCS motor subscore is 6.  Psychiatric:        Speech: Speech normal.        Behavior: Behavior normal. Behavior is cooperative.      Assessment and Plan:    Josue was seen today for hypertension and diabetes.  Diagnoses and all orders for this visit:  Primary hypertension Normotensive. Continue lisinopril-hydrochlorothiazide (ZESTORETIC) 20-25 MG tablet; Take 1 tablet by mouth daily. Follow-up in 6 - 12 mo. Advised that if she continues to lose weight to continue to monitor BP to see if she needs any decreased dosages. -     Comprehensive metabolic panel  Insulin resistance Update A1c. Potentially deprescribe metformin based on blood work results and ongoing weight loss. -     Hemoglobin A1c  Bipolar 1 disorder (HCC) Courtesy refill of abilify 10 mg and trazodone 50 mg until she gets seen by psychiatry. Referral to Dr. Altamese Robeline per patient request. I discussed with patient that if they develop any SI, to tell someone immediately and seek medical attention. -     Ambulatory referral to Psychiatry  Hyperlipidemia, unspecified hyperlipidemia type Update lipid panel and provide recommendations accordingly. -     Lipid panel  Seasonal allergic rhinitis, unspecified trigger Recommend ongoing avoidance of triggers if possible. Consider  daily antihistamine of choice. Refill of albuterol prn provided. Follow-up if any concerns or worsening symptoms.  Other orders -     traZODone (DESYREL) 50 MG tablet; Take 1 tablet (50 mg total) by mouth at bedtime. -     lisinopril-hydrochlorothiazide (ZESTORETIC) 20-25 MG tablet; Take 1 tablet by mouth daily. -     ARIPiprazole (ABILIFY) 10 MG tablet; Take 1 tablet (10 mg total) by mouth daily. -     albuterol (VENTOLIN HFA) 108 (90 Base) MCG/ACT inhaler; Inhale 1-2 puffs into the lungs every 6 (six) hours as needed for wheezing or shortness of breath.  CMA or LPN served as scribe during this visit. History, Physical, and Plan performed by medical provider. The above documentation has been reviewed and is accurate and complete.  [name], PA-C [place], [place] 02/02/2021  Follow-up: No follow-ups on file.

## 2021-02-03 MED ORDER — ATORVASTATIN CALCIUM 20 MG PO TABS
20.0000 mg | ORAL_TABLET | Freq: Every day | ORAL | 1 refills | Status: DC
Start: 2021-02-03 — End: 2021-04-23

## 2021-03-12 ENCOUNTER — Telehealth: Payer: Self-pay | Admitting: Physician Assistant

## 2021-03-12 NOTE — Progress Notes (Signed)
  Chronic Care Management   Outreach Note  03/12/2021 Name: [name] MRN: [id number] DOB: 02-09-1966  Referred by: [name], [place] Reason for referral : No chief complaint on file.   An unsuccessful telephone outreach was attempted today. The patient was referred to the pharmacist for assistance with care management and care coordination.   Follow Up Plan:   Lauretta Grill Upstream Scheduler

## 2021-03-12 NOTE — Chronic Care Management (AMB) (Signed)
  Chronic Care Management   Note  03/12/2021 Name: [name] MRN: [id number] DOB: June 04, 1966  [name] is a 55 y.o. year old female who is a primary care patient of [name], [place]. I reached out to [name] by phone today in response to a referral sent by [name]'s PCP, [name], PA.   [name] was given information about Chronic Care Management services today including:  CCM service includes personalized support from designated clinical staff supervised by her physician, including individualized plan of care and coordination with other care providers 24/7 contact phone numbers for assistance for urgent and routine care needs. Service will only be billed when office clinical staff spend 20 minutes or more in a month to coordinate care. Only one practitioner may furnish and bill the service in a calendar month. The patient may stop CCM services at any time (effective at the end of the month) by phone call to the office staff.   Patient agreed to services and verbal consent obtained.   Follow up plan:   [name] Upstream Scheduler

## 2021-04-15 ENCOUNTER — Telehealth: Payer: Self-pay

## 2021-04-15 NOTE — Chronic Care Management (AMB) (Signed)
    Chronic Care Management Pharmacy Assistant   Name: LARRISA CRAVEY  MRN: [id number] DOB: [birth year]  Reason for Encounter: Reschedule Appointment with Clinical Pharmacist  April Daiva Huge, [place] Pharmacist Assistant [phone]

## 2021-04-17 NOTE — Progress Notes (Signed)
    Chronic Care Management Pharmacy Assistant   Name: [name]  MRN: [id number] DOB: [birth year]  [name] is an 55 y.o. year old female who presents for his initial CCM visit with the clinical pharmacist.  Reason for Encounter: Initial Visit   Conditions to be addressed/monitored: HTN, Sleep Apnea, Insulin resistance, Vitamin D deficiency, chronic knee pain, Bipolar 1 disorder   Recent office visits:  02/02/2021 OV PCP [name], PA; patient has lost 80 lbs with diet and exercise. Advised that if she continues to lose weight to continue to monitor BP to see if she needs any decreased dosages. No medication changes indicated.  Recent consult visits:  None  Hospital visits:  None in previous 6 months  Medications: Outpatient Encounter Medications as of 04/15/2021  Medication Sig   albuterol (VENTOLIN HFA) 108 (90 Base) MCG/ACT inhaler Inhale 1-2 puffs into the lungs every 6 (six) hours as needed for wheezing or shortness of breath.   ARIPiprazole (ABILIFY) 10 MG tablet Take 1 tablet (10 mg total) by mouth daily.   atorvastatin (LIPITOR) 20 MG tablet Take 1 tablet (20 mg total) by mouth daily.   ferrous sulfate 325 (65 FE) MG tablet Take 325 mg by mouth daily with breakfast.   lisinopril-hydrochlorothiazide (ZESTORETIC) 20-25 MG tablet Take 1 tablet by mouth daily.   metFORMIN (GLUCOPHAGE) 500 MG tablet Take 1 tablet (500 mg total) by mouth 2 (two) times daily with a meal.   traZODone (DESYREL) 50 MG tablet Take 1 tablet (50 mg total) by mouth at bedtime.   No facility-administered encounter medications on file as of 04/15/2021.    Current Medications: Atorvastatin 20 mg last filled Lisinopril-HCTZ 20-25 mg last filled Abilify 10 mg last filled Ventolin HFA 108 mcg/act last filled Ferrous Sulfate 325 mg  Have you seen any other providers since your last visit?   Any changes in your medications or health?  Any side effects from any medications?   Do you have  any symptoms or problems not managed by your medications?  Any concerns about your health right now?  Has your provider asked that you check blood pressure, blood sugar, or follow special diet at home?  Do you get any type of exercise on a regular basis?  Can you think of a goal you would like to reach for your health?  Do you have any problems getting your medications?  Is there anything that you would like to discuss during the appointment?   Please bring medications and supplements to appointment   Star Rating Drugs: Atorvastatin 20 mg last filled Lisinopril-HCTZ 20-25 mg last filled  *Unsuccessful attempt at reaching patient to complete this call. Chart review completed.  April D Calhoun, [place] Pharmacist Assistant [phone]

## 2021-04-21 ENCOUNTER — Ambulatory Visit (INDEPENDENT_AMBULATORY_CARE_PROVIDER_SITE_OTHER): Payer: PPO | Admitting: Pharmacist

## 2021-04-21 DIAGNOSIS — F319 Bipolar disorder, unspecified: Secondary | ICD-10-CM

## 2021-04-21 DIAGNOSIS — E785 Hyperlipidemia, unspecified: Secondary | ICD-10-CM

## 2021-04-21 DIAGNOSIS — I10 Essential (primary) hypertension: Secondary | ICD-10-CM | POA: Diagnosis not present

## 2021-04-21 DIAGNOSIS — E8881 Metabolic syndrome: Secondary | ICD-10-CM

## 2021-04-21 NOTE — Progress Notes (Signed)
Chronic Care Management Pharmacy Note  04/21/2021 Name:  [name] MRN:  [id number] DOB:  05-Sep-1966  Summary: Initial visit with PharmD.  She reports not taking her statin due to adverse effects.  (Headache, and general not feeling well.)  We discussed the risks of her elevated LDL on stroke/heart attack.  Continues positive lifestyle mods with diet and exercise.  All other meds current and patient is adherent  Recommendations/Changes made from today's visit: Consider switching to Crestor 18m three times weekly until next lipid panel.  Would benefit from at least taking some form of statin.  Plan: FU with PharmD in 6 month Check in sooner if new med started   Subjective: [name] is an 55y.o. year old female who is a primary patient of [name]  The CCM team was consulted for assistance with disease management and care coordination needs.    Engaged with patient by telephone for initial visit in response to provider referral for pharmacy case management and/or care coordination services.   Consent to Services:  The patient was given the following information about Chronic Care Management services today, agreed to services, and gave verbal consent: 1. CCM service includes personalized support from designated clinical staff supervised by the primary care provider, including individualized plan of care and coordination with other care providers 2. 24/7 contact phone numbers for assistance for urgent and routine care needs. 3. Service will only be billed when office clinical staff spend 20 minutes or more in a month to coordinate care. 4. Only one practitioner may furnish and bill the service in a calendar month. 5.The patient may stop CCM services at any time (effective at the end of the month) by phone call to the office staff. 6. The patient will be responsible for cost sharing (co-pay) of up to 20% of the service fee (after annual deductible is met). Patient agreed to  services and consent obtained.  Patient Care Team: Dana Coke PUtahas PCP - General (Physician Assistant) BThornton Park MD as Consulting Physician (Gastroenterology) EDaron OfferARichard Miu MD as Consulting Physician (Psychiatry) DEdythe Clarity [facility] Pharmacist (Pharmacist)  Recent office visits:  02/02/2021 OV PCP Dana Coke POrinda patient has lost 80 lbs with diet and exercise. Advised that if she continues to lose weight to continue to monitor BP to see if she needs any decreased dosages. No medication changes indicated.   Recent consult visits:  None   Hospital visits:  None in previous 6 months   Objective:  Lab Results  Component Value Date   CREATININE 0.81 02/02/2021   BUN 11 02/02/2021   GFR 83.07 02/02/2021   GFRNONAA >60 03/20/2019   GFRAA >60 03/20/2019   NA 139 02/02/2021   K 4.1 02/02/2021   CALCIUM 9.1 02/02/2021   CO2 29 02/02/2021   GLUCOSE 84 02/02/2021    Lab Results  Component Value Date/Time   HGBA1C 5.5 02/02/2021 11:33 AM   HGBA1C 5.5 03/27/2020 10:10 AM   GFR 83.07 02/02/2021 11:33 AM   GFR 85.29 01/30/2019 09:56 AM    Last diabetic Eye exam:  Lab Results  Component Value Date/Time   HMDIABEYEEXA No Retinopathy 02/07/2018 12:00 AM    Last diabetic Foot exam: No results found for: HMDIABFOOTEX   Lab Results  Component Value Date   CHOL 165 02/02/2021   HDL 37.20 (L) 02/02/2021   LDLCALC 105 (H) 02/02/2021   TRIG 116.0 02/02/2021   CHOLHDL 4 02/02/2021    Hepatic  Function Latest Ref Rng & Units 02/02/2021 02/20/2019 01/30/2019  Total Protein 6.0 - 8.3 g/dL 7.4 8.4(H) 7.4  Albumin 3.5 - 5.2 g/dL 3.9 3.5 3.6  AST 0 - 37 U/L _0 ALT 0 - 35 U/L _1 Alk Phosphatase 39 - 117 U/L 70 68 78  Total Bilirubin 0.2 - 1.2 mg/dL 0.5 0.6 0.4    Lab Results  Component Value Date/Time   TSH 0.95 09/12/2018 09:16 AM   TSH 1.91 05/25/2017 11:02 AM    CBC Latest Ref Rng & Units 03/20/2019 02/20/2019 01/30/2019  WBC 4.0 -  10.5 K/uL 6.3 11.6(H) 6.6  Hemoglobin 12.0 - 15.0 g/dL 9.5(L) 9.7(L) 9.6(L)  Hematocrit 36.0 - 46.0 % 32.5(L) 31.9(L) 30.3(L)  Platelets 150 - 400 K/uL 379 413(H) 431.0(H)    Lab Results  Component Value Date/Time   VD25OH 15.51 (L) 01/30/2019 09:56 AM   VD25OH 6.01 (L) 05/25/2017 11:02 AM    Clinical ASCVD: No  The 10-year ASCVD risk score Mikey Bussing DC Jr., et al., 2013) is: 5.6%   Values used to calculate the score:     Age: 23 years     Sex: Female     Is Non-Hispanic African American: Yes     Diabetic: No     Tobacco smoker: No     Systolic Blood Pressure: 222 mmHg     Is BP treated: Yes     HDL Cholesterol: 37.2 mg/dL     Total Cholesterol: 165 mg/dL    Depression screen [facility] 2/9 09/05/2020 07/30/2019 07/06/2018  Decreased Interest 0 0 0  Down, Depressed, Hopeless 1 0 1  PHQ - 2 Score 1 0 1  Altered sleeping - - -  Tired, decreased energy - - -  Change in appetite - - -  Feeling bad or failure about yourself  - - -  Trouble concentrating - - -  Moving slowly or fidgety/restless - - -  Suicidal thoughts - - -  PHQ-9 Score - - -  Difficult doing work/chores - - -     Social History   Tobacco Use  Smoking Status Never  Smokeless Tobacco Never   BP Readings from Last 3 Encounters:  02/02/21 128/80  02/02/21 (!) 141/80  05/19/20 100/71   Pulse Readings from Last 3 Encounters:  02/02/21 86  02/02/21 (!) 101  05/19/20 (!) 104   Wt Readings from Last 3 Encounters:  02/02/21 236 lb (107 kg)  02/02/21 232 lb (105.2 kg)  03/27/20 243 lb (110.2 kg)   BMI Readings from Last 3 Encounters:  02/02/21 46.09 kg/m  02/02/21 45.31 kg/m  03/27/20 47.46 kg/m    Assessment/Interventions: Review of patient past medical history, allergies, medications, health status, including review of consultants reports, laboratory and other test data, was performed as part of comprehensive evaluation and provision of chronic care management services.   SDOH:  (Social Determinants of  Health) assessments and interventions performed: Yes  Financial Resource Strain: Low Risk    Difficulty of Paying Living Expenses: Not hard at all    SDOH Screenings   Alcohol Screen: Not on file  Depression (PHQ2-9): Low Risk    PHQ-2 Score: 1  Financial Resource Strain: Low Risk    Difficulty of Paying Living Expenses: Not hard at all  Food Insecurity: No Food Insecurity   Worried About Charity fundraiser in the Last Year: Never true   Ran Out of Food in the Last Year: Never true  Housing:  Low Risk    Last Housing Risk Score: 0  Physical Activity: Insufficiently Active   Days of Exercise per Week: 3 days   Minutes of Exercise per Session: 30 min  Social Connections: Socially Isolated   Frequency of Communication with Friends and Family: More than three times a week   Frequency of Social Gatherings with Friends and Family: Once a week   Attends Religious Services: Never   Marine scientist or Organizations: No   Attends Archivist Meetings: Never   Marital Status: Never married  Stress: No Stress Concern Present   Feeling of Stress : Only a little  Tobacco Use: Low Risk    Smoking Tobacco Use: Never   Smokeless Tobacco Use: Never  Transportation Needs: No Transportation Needs   Lack of Transportation (Medical): No   Lack of Transportation (Non-Medical): No    CCM Care Plan  No Known Allergies  Medications Reviewed Today     Reviewed by Edythe Clarity, [facility] (Pharmacist) on 04/21/21 at 0948  Med List Status: <None>   Medication Order Taking? Sig Documenting Provider Last Dose Status Informant  albuterol (VENTOLIN HFA) 108 (90 Base) MCG/ACT inhaler [id number] Yes Inhale 1-2 puffs into the lungs every 6 (six) hours as needed for wheezing or shortness of breath. [name], [place] Taking Active   ARIPiprazole (ABILIFY) 10 MG tablet [id number] Yes Take 1 tablet (10 mg total) by mouth daily. [name], [place] Taking Active   atorvastatin (LIPITOR) 20  MG tablet [id number] No Take 1 tablet (20 mg total) by mouth daily.  Patient not taking: Reported on 04/21/2021   [name], PA Not Taking Active   cholecalciferol (VITAMIN D3) 25 MCG (1000 UNIT) tablet [id number] Yes Take 5,000 Units by mouth daily. [provider]  Active   ferrous sulfate 325 (65 FE) MG tablet [id number] Yes Take 325 mg by mouth daily with breakfast. [provider] Taking Active Self  lisinopril-hydrochlorothiazide (ZESTORETIC) 20-25 MG tablet [id number] Yes Take 1 tablet by mouth daily. [name], [place] Taking Active   metFORMIN (GLUCOPHAGE) 500 MG tablet [id number] Yes Take 1 tablet (500 mg total) by mouth 2 (two) times daily with a meal. [name], [place] Taking Active   traZODone (DESYREL) 50 MG tablet [id number] Yes Take 1 tablet (50 mg total) by mouth at bedtime. [name], [place] Taking Active   vitamin C (ASCORBIC ACID) 500 MG tablet [id number] Yes Take 500 mg by mouth daily. [provider]  Active             Patient Active Problem List   Diagnosis Date Noted   Polyp of colon    Knee pain, chronic 04/10/2018   Insulin resistance 04/10/2018   Vitamin D deficiency    Sleep apnea 03/31/2017   Morbid obesity ([place]) 03/31/2017   Hypertension 03/31/2017   Bipolar 1 disorder ([place]) 03/31/2017    Immunization History  Administered Date(s) Administered   PFIZER(Purple Top)SARS-COV-2 Vaccination 01/05/2020, 01/29/2020, 08/11/2020   Tdap 05/12/2018    Conditions to be addressed/monitored:  HTN, Bipolar 1, Insulin resistance, HLD  Care Plan : General Pharmacy (Adult)  Updates made by Edythe Clarity, RPH since 04/21/2021 12:00 AM     Problem: HTN, Bipolar 1, Insulin resistance, HLD   Priority: High  Onset Date: 04/21/2021     Long-Range Goal: Patient-Specific Goal   Start Date: 04/21/2021  Expected End Date: 10/22/2021  This Visit's Progress: On track  Priority: High  Note:   Current Barriers:  Unable to achieve  control of cholesterol   Pharmacist Clinical Goal(s):  Patient will achieve control of cholesterol as evidenced by updated labs adhere to plan to optimize therapeutic regimen for lipids as evidenced by report of adherence to recommended medication management changes contact provider office for questions/concerns as evidenced notation of same in electronic health record through collaboration with PharmD and provider.   Interventions: 1:1 collaboration with [name], PA regarding development and update of comprehensive plan of care as evidenced by provider attestation and co-signature Inter-disciplinary care team collaboration (see longitudinal plan of care) Comprehensive medication review performed; medication list updated in electronic medical record  Hypertension (BP goal <140/90) -Controlled -Current treatment: Lisinopril/HCTZ 20-53m daily -Medications previously tried: none noted  -Current home readings: 143/83 yesterday, normally < 140/90 per patient -Current dietary habits: patient has decreased soda intake, cut back on fried foods and sweets -Current exercise habits: she is walking 3 days per week for 30 minutes to 1 hour on treadmill at her house -Denies hypotensive/hypertensive symptoms -Educated on BP goals and benefits of medications for prevention of heart attack, stroke and kidney damage; Daily salt intake goal < 2300 mg; Exercise goal of 150 minutes per week; Importance of home blood pressure monitoring; Symptoms of hypotension and importance of maintaining adequate hydration; -Counseled to monitor BP at home a few times per week, document, and provide log at future appointments -Recommended to continue current medication  Hyperlipidemia: (LDL goal < 100) -Uncontrolled -Current treatment: Atorvastatin 224mdaily - patient d/c on her own -Medications previously tried: none noted  -Current dietary patterns: see above -Current exercise habits: see  above -Educated on Cholesterol goals;  Benefits of statin for ASCVD risk reduction; Importance of limiting foods high in cholesterol; -She reports she has stopped taking this medication.  It caused her to have headaches and just overall not feel good.  Symptoms have improved since she has stopped taking this medication.  I explained the benefit of statins to patient and discussed other alternatives.  Would prefer her to take one to reduce risk of heart attack/stroke. -Recommend trial of another statin such as Crestor 56m16mnd start with three times per week to see if patient can tolerate this.  At lipid recheck we can evaluate increasing frequency based on tolerability and lipid levels.  Will consult with PCP.  Bipolar (Goal: Minimize symptoms) -Controlled -Current treatment: Abilify 84m4mily -Medications previously tried/failed: doxepin, Latuda -PHQ9:  [place] Total Score 9     -GAD7:  GAD 7 : Generalized Anxiety Score 04/10/2018  Nervous, Anxious, on Edge 1  Control/stop worrying 1  Worry too much - different things 1  Trouble relaxing 2  Restless 1  Easily annoyed or irritable 1  Afraid - awful might happen 0  Total GAD 7 Score 7  Anxiety Difficulty Somewhat difficult  -Educated on Benefits of medication for symptom control -She reports Abilify has been the best at keeping her stable -Recommended to continue current medication  Insulin Resistance (Goal: Euglycemia) -Controlled -Current treatment  Metformin 500mg81m -Medications previously tried: none ntoed -Recently decreased to current dose -She reports one fasting sugar to me of 150 a few days ago.  Discussed goals for fasting and 2 hr PPG.  Denies any recent episodes of hypoglycemia or symptoms.  A1c was excellent at last recheck.  She continues excellent lifestyle changes and continued weight loss.  -Recommended to continue current  medication Monitor sugar and  A1c, may be able to further decrease if patient lifestyle mods continue.  Patient Goals/Self-Care Activities Patient will:  - take medications as prescribed check glucose a few times per week, document, and provide at future appointments check blood pressure periodically, document, and provide at future appointments target a minimum of 150 minutes of moderate intensity exercise weekly  Follow Up Plan: The care management team will reach out to the patient again over the next 180 days.       Medication Assistance: None required.  Patient affirms current coverage meets needs.  Compliance/Adherence/Medication fill history: Care Gaps: Zoster Vaccines  Star-Rating Drugs: Star Rating Drugs: Atorvastatin 20 mg last filled Lisinopril-HCTZ 20-25 mg last filled  Patient's preferred pharmacy is:  [facility] [id number] - [address], [place] [address] Phone: [phone] Fax: [phone]  [id number]: [phone]: [phone] Uses pill box? Yes Pt endorses 100% compliance  We discussed: Benefits of medication synchronization, packaging and delivery as well as enhanced pharmacist oversight with [employer]. Patient decided to: Continue current medication management strategy  Care Plan and Follow Up Patient Decision:  Patient agrees to Care Plan and Follow-up.  Plan: The care management team will reach out to the patient again over the next 180 days.  CBeverly Milch PharmD Clinical Pharmacist ([phone]

## 2021-04-21 NOTE — Patient Instructions (Signed)
Visit Information   Goals Addressed             This Visit's Progress    Track and Manage My Blood Pressure-Hypertension       Timeframe:  Long-Range Goal Priority:  High Start Date:  04/21/21                           Expected End Date: 10/22/20                      Follow Up Date 08/02/21    - check blood pressure 3 times per week - choose a place to take my blood pressure (home, clinic or office, retail store) - write blood pressure results in a log or diary    Why is this important?   You won't feel high blood pressure, but it can still hurt your blood vessels.  High blood pressure can cause heart or kidney problems. It can also cause a stroke.  Making lifestyle changes like losing a little weight or eating less salt will help.  Checking your blood pressure at home and at different times of the day can help to control blood pressure.  If the doctor prescribes medicine remember to take it the way the doctor ordered.  Call the office if you cannot afford the medicine or if there are questions about it.     Notes:        Patient Care Plan: General Pharmacy (Adult)     Problem Identified: HTN, Bipolar 1, Insulin resistance, HLD   Priority: High  Onset Date: 04/21/2021     Long-Range Goal: Patient-Specific Goal   Start Date: 04/21/2021  Expected End Date: 10/22/2021  This Visit's Progress: On track  Priority: High  Note:   Current Barriers:  Unable to achieve control of cholesterol   Pharmacist Clinical Goal(s):  Patient will achieve control of cholesterol as evidenced by updated labs adhere to plan to optimize therapeutic regimen for lipids as evidenced by report of adherence to recommended medication management changes contact provider office for questions/concerns as evidenced notation of same in electronic health record through collaboration with PharmD and provider.   Interventions: 1:1 collaboration with [name], PA regarding development and update  of comprehensive plan of care as evidenced by provider attestation and co-signature Inter-disciplinary care team collaboration (see longitudinal plan of care) Comprehensive medication review performed; medication list updated in electronic medical record  Hypertension (BP goal <140/90) -Controlled -Current treatment: Lisinopril/HCTZ 20-25mg  daily -Medications previously tried: none noted  -Current home readings: 143/83 yesterday, normally < 140/90 per patient -Current dietary habits: patient has decreased soda intake, cut back on fried foods and sweets -Current exercise habits: she is walking 3 days per week for 30 minutes to 1 hour on treadmill at her house -Denies hypotensive/hypertensive symptoms -Educated on BP goals and benefits of medications for prevention of heart attack, stroke and kidney damage; Daily salt intake goal < 2300 mg; Exercise goal of 150 minutes per week; Importance of home blood pressure monitoring; Symptoms of hypotension and importance of maintaining adequate hydration; -Counseled to monitor BP at home a few times per week, document, and provide log at future appointments -Recommended to continue current medication  Hyperlipidemia: (LDL goal < 100) -Uncontrolled -Current treatment: Atorvastatin 20mg  daily - patient d/c on her own -Medications previously tried: none noted  -Current dietary patterns: see above -Current exercise habits: see above -Educated on Cholesterol goals;  Benefits  of statin for ASCVD risk reduction; Importance of limiting foods high in cholesterol; -She reports she has stopped taking this medication.  It caused her to have headaches and just overall not feel good.  Symptoms have improved since she has stopped taking this medication.  I explained the benefit of statins to patient and discussed other alternatives.  Would prefer her to take one to reduce risk of heart attack/stroke. -Recommend trial of another statin such as Crestor 5mg  and  start with three times per week to see if patient can tolerate this.  At lipid recheck we can evaluate increasing frequency based on tolerability and lipid levels.  Will consult with PCP.  Bipolar (Goal: Minimize symptoms) -Controlled -Current treatment: Abilify 10mg  daily -Medications previously tried/failed: doxepin, Latuda -PHQ9:  [name] Office Visit from 04/10/2018 in [place]  PHQ-9 Total Score 9     -GAD7:  GAD 7 : Generalized Anxiety Score 04/10/2018  Nervous, Anxious, on Edge 1  Control/stop worrying 1  Worry too much - different things 1  Trouble relaxing 2  Restless 1  Easily annoyed or irritable 1  Afraid - awful might happen 0  Total GAD 7 Score 7  Anxiety Difficulty Somewhat difficult  -Educated on Benefits of medication for symptom control -She reports Abilify has been the best at keeping her stable -Recommended to continue current medication  Insulin Resistance (Goal: Euglycemia) -Controlled -Current treatment  Metformin 500mg  BID -Medications previously tried: none ntoed -Recently decreased to current dose -She reports one fasting sugar to me of 150 a few days ago.  Discussed goals for fasting and 2 hr PPG.  Denies any recent episodes of hypoglycemia or symptoms.  A1c was excellent at last recheck.  She continues excellent lifestyle changes and continued weight loss.  -Recommended to continue current medication Monitor sugar and A1c, may be able to further decrease if patient lifestyle mods continue.  Patient Goals/Self-Care Activities Patient will:  - take medications as prescribed check glucose a few times per week, document, and provide at future appointments check blood pressure periodically, document, and provide at future appointments target a minimum of 150 minutes of moderate intensity exercise weekly  Follow Up Plan: The care management team will reach out to the patient again over the next 180 days.      [name]  was given information about Chronic Care Management services today including:  CCM service includes personalized support from designated clinical staff supervised by her physician, including individualized plan of care and coordination with other care providers 24/7 contact phone numbers for assistance for urgent and routine care needs. Standard insurance, coinsurance, copays and deductibles apply for chronic care management only during months in which we provide at least 20 minutes of these services. Most insurances cover these services at 100%, however patients may be responsible for any copay, coinsurance and/or deductible if applicable. This service may help you avoid the need for more expensive face-to-face services. Only one practitioner may furnish and bill the service in a calendar month. The patient may stop CCM services at any time (effective at the end of the month) by phone call to the office staff.  Patient agreed to services and verbal consent obtained.   The patient verbalized understanding of instructions, educational materials, and care plan provided today and agreed to receive a mailed copy of patient instructions, educational materials, and care plan.  Telephone follow up appointment with pharmacy team member scheduled for: 6 months  Edythe Clarity, [facility]  [phone] 

## 2021-04-23 MED ORDER — ROSUVASTATIN CALCIUM 5 MG PO TABS: 5.0000 mg | ORAL_TABLET | ORAL | 3 refills | Status: DC

## 2021-04-23 NOTE — Addendum Note (Signed)
Addended by: Edythe Clarity on: 04/23/2021 10:24 AM   Modules accepted: Orders

## 2021-04-27 ENCOUNTER — Telehealth: Payer: Self-pay | Admitting: Pharmacist

## 2021-04-27 NOTE — Progress Notes (Addendum)
    Chronic Care Management Pharmacy Assistant   Name: [name]  MRN: [id number] DOB: 29-Feb-1966  Reason for Encounter: [place]   Conditions to be addressed/monitored: HTN, Bipolar 1, Insulin resistance, HLD   Medications: Outpatient Encounter Medications as of 04/27/2021  Medication Sig   albuterol (VENTOLIN HFA) 108 (90 Base) MCG/ACT inhaler Inhale 1-2 puffs into the lungs every 6 (six) hours as needed for wheezing or shortness of breath.   ARIPiprazole (ABILIFY) 10 MG tablet Take 1 tablet (10 mg total) by mouth daily.   cholecalciferol (VITAMIN D3) 25 MCG (1000 UNIT) tablet Take 5,000 Units by mouth daily.   ferrous sulfate 325 (65 FE) MG tablet Take 325 mg by mouth daily with breakfast.   lisinopril-hydrochlorothiazide (ZESTORETIC) 20-25 MG tablet Take 1 tablet by mouth daily.   metFORMIN (GLUCOPHAGE) 500 MG tablet Take 1 tablet (500 mg total) by mouth 2 (two) times daily with a meal.   rosuvastatin (CRESTOR) 5 MG tablet Take 1 tablet (5 mg total) by mouth 3 (three) times a week.   traZODone (DESYREL) 50 MG tablet Take 1 tablet (50 mg total) by mouth at bedtime.   vitamin C (ASCORBIC ACID) 500 MG tablet Take 500 mg by mouth daily.   No facility-administered encounter medications on file as of 04/27/2021.   Reviewed the patients initial visit reinsured it was completed per the pharmacist [name] request. Printed the CCM care plan. Mailed the patient CCM care plan to their most recent address on file.  Follow-Up:Pharmacist Review  Charlann Lange, [place] Pharmacist Assistant [phone]

## 2021-05-04 ENCOUNTER — Telehealth: Payer: Self-pay | Admitting: *Deleted

## 2021-05-04 MED ORDER — TRAZODONE HCL 100 MG PO TABS
100.0000 mg | ORAL_TABLET | Freq: Every day | ORAL | 2 refills | Status: DC
Start: 2021-05-04 — End: 2021-09-02

## 2021-05-04 NOTE — Telephone Encounter (Signed)
Spoke to pt told her Dr. Jerline Pain said okay to take 100 mg of Trazodone but need to make an appt this month with [facility]. Pt verbalized understanding. Told pt I will send Rx Trazodone 100 mg to [facility]. Pt verbalized understanding.

## 2021-05-04 NOTE — Telephone Encounter (Signed)
-----   Message from Vivi Barrack, MD sent at 05/04/2021 10:30 AM EDT ----- It is fine for her to be on '100mg'$  daily. Please ask her to schedule appointment with her PCP soon.  Algis Greenhouse. Jerline Pain, MD 05/04/2021 10:30 AM   ----- Message ----- From: Edythe Clarity, [facility] Sent: 05/04/2021  10:09 AM EDT To: [name], PA  Hey!   This patient called me and stated she is now taking Trazodone as '100mg'$  daily and she is running out sooner than her rx will allow her to refill.  Were you aware of this change?  If so she has requested a new refill for the '100mg'$  sent to her Section.  Thanks!!  Beverly Milch, PharmD Clinical Pharmacist [phone]

## 2021-05-11 ENCOUNTER — Telehealth: Payer: Self-pay | Admitting: Pharmacist

## 2021-05-11 NOTE — Chronic Care Management (AMB) (Addendum)
    Chronic Care Management Pharmacy Assistant   Name: [name]  MRN: [id number] DOB: 04-Jan-1966   Reason for Encounter: General Adherence Disease State Call    Recent office visits:  None  Recent consult visits:  None  Hospital visits:  None in previous 6 months  Medications: Outpatient Encounter Medications as of 05/11/2021  Medication Sig   albuterol (VENTOLIN HFA) 108 (90 Base) MCG/ACT inhaler Inhale 1-2 puffs into the lungs every 6 (six) hours as needed for wheezing or shortness of breath.   ARIPiprazole (ABILIFY) 10 MG tablet Take 1 tablet (10 mg total) by mouth daily.   cholecalciferol (VITAMIN D3) 25 MCG (1000 UNIT) tablet Take 5,000 Units by mouth daily.   ferrous sulfate 325 (65 FE) MG tablet Take 325 mg by mouth daily with breakfast.   lisinopril-hydrochlorothiazide (ZESTORETIC) 20-25 MG tablet Take 1 tablet by mouth daily.   metFORMIN (GLUCOPHAGE) 500 MG tablet Take 1 tablet (500 mg total) by mouth 2 (two) times daily with a meal.   rosuvastatin (CRESTOR) 5 MG tablet Take 1 tablet (5 mg total) by mouth 3 (three) times a week.   traZODone (DESYREL) 100 MG tablet Take 1 tablet (100 mg total) by mouth at bedtime.   vitamin C (ASCORBIC ACID) 500 MG tablet Take 500 mg by mouth daily.   No facility-administered encounter medications on file as of 05/11/2021.   Reviewed chart for medication changes ahead of medication coordination call.  No OVs, Consults, or hospital visits since last care coordination call/Pharmacist visit.   No medication changes indicated.  Patient Questions: Have you had any problems recently with your health? Patient states she has not had any problems recently with her health.  Have you had any problems with your pharmacy? Patient states she has not had any problems recently with her pharmacy.  What issues or side effects are you having with your medications? Patient states she has not had any side effects with her medication.  What would  you like me to pass along to [name], CPP for him to help you with?  Patient states she does not have anything to pass along at this time.  What can we do to take care of you better? Patient did not have any suggestions.  Patient states she started taking Crestor 3 weeks ago and is doing okay so far. She denies having any side effects from her statin medication Crestor.  Future Appointments  Date Time Provider [place]  09/14/2021  8:45 AM [facility] HEALTH COACH [facility]  10/26/2021  2:00 PM [facility] CCM PHARMACIST [facility]   Star Rating Drugs: Lisinopril-HCTZ 20-25 mg last filled 10/29/2020 90 DS  April D Calhoun, [place] Pharmacist Assistant [phone]  10 minutes spent in review, coordination, and documentation.  Reviewed by: Beverly Milch, PharmD Clinical Pharmacist [phone]

## 2021-07-30 ENCOUNTER — Other Ambulatory Visit: Payer: Self-pay | Admitting: Physician Assistant

## 2021-08-05 ENCOUNTER — Telehealth: Payer: Self-pay | Admitting: Pharmacist

## 2021-08-05 NOTE — Chronic Care Management (AMB) (Signed)
    Chronic Care Management Pharmacy Assistant   Name: [name]  MRN: [id number] DOB: 1966/07/11   Reason for Encounter: General Adherence Call    Recent office visits:  None  Recent consult visits:  None  Hospital visits:  None in previous 6 months  Medications: Outpatient Encounter Medications as of 08/05/2021  Medication Sig   albuterol (VENTOLIN HFA) 108 (90 Base) MCG/ACT inhaler Inhale 1-2 puffs into the lungs every 6 (six) hours as needed for wheezing or shortness of breath.   ARIPiprazole (ABILIFY) 10 MG tablet Take 1 tablet (10 mg total) by mouth daily.   cholecalciferol (VITAMIN D3) 25 MCG (1000 UNIT) tablet Take 5,000 Units by mouth daily.   ferrous sulfate 325 (65 FE) MG tablet Take 325 mg by mouth daily with breakfast.   lisinopril-hydrochlorothiazide (ZESTORETIC) 20-25 MG tablet Take 1 tablet by mouth daily.   metFORMIN (GLUCOPHAGE) 500 MG tablet TAKE ONE TABLET BY MOUTH TWICE A DAY WITH A MEAL   rosuvastatin (CRESTOR) 5 MG tablet Take 1 tablet (5 mg total) by mouth 3 (three) times a week.   traZODone (DESYREL) 100 MG tablet Take 1 tablet (100 mg total) by mouth at bedtime.   vitamin C (ASCORBIC ACID) 500 MG tablet Take 500 mg by mouth daily.   No facility-administered encounter medications on file as of 08/05/2021.   Patient Questions: Have you had any problems recently with your health? Patient states she has not had any problems recently with her health.  Have you had any problems with your pharmacy? Patient states she has not had any problems recently with her pharmacy.  What issues or side effects are you having with your medications? Patient states her most recent medication is Rosuvastatin. She denies any troubles or issues with said medication. She states she is doing well with all of her medications at this time.  What would you like me to pass along to [name], CPP for him to help you with?  Patient states she does not have anything for me to  pass along.  What can we do to take care of you better? Patient did not have any suggestions. She is happy with her current level of care.  Care Gaps: Medicare Annual Wellness: Scheduled  Hemoglobin A1C: 5.5% on 02/02/2021 Colonoscopy: Next due on 12/24/2021 Pap Smear: Next due on 07/25/2023 Dexa Scan: Due now - patient states she will discuss this at her next follow up with her PCP. Mammogram: Next due on 10/09/2021  Future Appointments  Date Time Provider [place]  09/14/2021  8:45 AM [facility] HEALTH COACH [facility]  10/26/2021  2:00 PM [facility] CCM PHARMACIST [facility]    Star Rating Drugs: None - No filled dates available for Rosuvastatin  April D Calhoun, [place] Pharmacist Assistant [phone]

## 2021-08-31 ENCOUNTER — Telehealth: Payer: Self-pay | Admitting: Pharmacist

## 2021-08-31 NOTE — Chronic Care Management (AMB) (Signed)
    Chronic Care Management Pharmacy Assistant   Name: KENLIE SEKI  MRN: [id number] DOB: [birth year]   Reason for Encounter: Patient Call   Medications: Outpatient Encounter Medications as of 08/31/2021  Medication Sig   albuterol (VENTOLIN HFA) 108 (90 Base) MCG/ACT inhaler Inhale 1-2 puffs into the lungs every 6 (six) hours as needed for wheezing or shortness of breath.   ARIPiprazole (ABILIFY) 10 MG tablet Take 1 tablet (10 mg total) by mouth daily.   cholecalciferol (VITAMIN D3) 25 MCG (1000 UNIT) tablet Take 5,000 Units by mouth daily.   ferrous sulfate 325 (65 FE) MG tablet Take 325 mg by mouth daily with breakfast.   lisinopril-hydrochlorothiazide (ZESTORETIC) 20-25 MG tablet Take 1 tablet by mouth daily.   metFORMIN (GLUCOPHAGE) 500 MG tablet TAKE ONE TABLET BY MOUTH TWICE A DAY WITH A MEAL   rosuvastatin (CRESTOR) 5 MG tablet Take 1 tablet (5 mg total) by mouth 3 (three) times a week.   traZODone (DESYREL) 100 MG tablet Take 1 tablet (100 mg total) by mouth at bedtime.   vitamin C (ASCORBIC ACID) 500 MG tablet Take 500 mg by mouth daily.   No facility-administered encounter medications on file as of 08/31/2021.   Patient called to request refills on some of her medications.  Patient states she hasn't been able to find a psychiatrist to take over her prescriptions for Abilify and Trazodone. She would like to know if [name], [place] would send in another 90 day prescription to get her by until she finds someone to take over.  She would like these prescriptions sent to [facility] on [address] in [place].  April D Calhoun, [place] Pharmacist Assistant [phone]

## 2021-09-02 MED ORDER — TRAZODONE HCL 100 MG PO TABS: 100.0000 mg | ORAL_TABLET | Freq: Every day | ORAL | 0 refills | Status: AC

## 2021-09-02 MED ORDER — ARIPIPRAZOLE 10 MG PO TABS: 10.0000 mg | ORAL_TABLET | Freq: Every day | ORAL | 0 refills | Status: DC

## 2021-09-02 NOTE — Addendum Note (Signed)
Addended by: Marian Sorrow on: 09/02/2021 04:20 PM   Modules accepted: Orders

## 2021-09-02 NOTE — Telephone Encounter (Signed)
Spoke to pt told her will send Rx's to pharmacy Trazodone and Abilify, verified pharmacy. Pt verbalized understanding.

## 2021-09-14 ENCOUNTER — Other Ambulatory Visit: Payer: Self-pay

## 2021-09-14 ENCOUNTER — Ambulatory Visit (INDEPENDENT_AMBULATORY_CARE_PROVIDER_SITE_OTHER): Payer: PPO

## 2021-09-14 DIAGNOSIS — Z0000 Encounter for general adult medical examination without abnormal findings: Secondary | ICD-10-CM

## 2021-09-14 NOTE — Patient Instructions (Signed)
[name] , Thank you for taking time to come for your Medicare Wellness Visit. I appreciate your ongoing commitment to your health goals. Please review the following plan we discussed and let me know if I can assist you in the future.   Screening recommendations/referrals: Colonoscopy: Done 12/04/18 repeat every 3 years  Mammogram: Done 10/10/19 repeat every year  Recommended yearly ophthalmology/optometry visit for glaucoma screening and checkup Recommended yearly dental visit for hygiene and checkup  Vaccinations: Influenza vaccine: Declined and dicussed  Pneumococcal vaccine: Declined and discussed  Tdap vaccine: Done 05/12/18 repeat every 10 years  Shingles vaccine: Shingrix discussed. Please contact your pharmacy for coverage information.   Covid-19: 4/3, 4/27, & 08/11/20    Advanced directives: Completed   Conditions/risks identified: Lose 40 lbs   Next appointment: Follow up in one year for your annual wellness visit.   Preventive Care 40-64 Years, Female Preventive care refers to lifestyle choices and visits with your health care provider that can promote health and wellness. What does preventive care include? A yearly physical exam. This is also called an annual well check. Dental exams once or twice a year. Routine eye exams. Ask your health care provider how often you should have your eyes checked. Personal lifestyle choices, including: Daily care of your teeth and gums. Regular physical activity. Eating a healthy diet. Avoiding tobacco and drug use. Limiting alcohol use. Practicing safe sex. Taking low-dose aspirin daily starting at age 32. Taking vitamin and mineral supplements as recommended by your health care provider. What happens during an annual well check? The services and screenings done by your health care provider during your annual well check will depend on your age, overall health, lifestyle risk factors, and family history of disease. Counseling  Your  health care provider may ask you questions about your: Alcohol use. Tobacco use. Drug use. Emotional well-being. Home and relationship well-being. Sexual activity. Eating habits. Work and work Statistician. Method of birth control. Menstrual cycle. Pregnancy history. Screening  You may have the following tests or measurements: Height, weight, and BMI. Blood pressure. Lipid and cholesterol levels. These may be checked every 5 years, or more frequently if you are over 2 years old. Skin check. Lung cancer screening. You may have this screening every year starting at age 11 if you have a 30-pack-year history of smoking and currently smoke or have quit within the past 15 years. Fecal occult blood test (FOBT) of the stool. You may have this test every year starting at age 51. Flexible sigmoidoscopy or colonoscopy. You may have a sigmoidoscopy every 5 years or a colonoscopy every 10 years starting at age 36. Hepatitis C blood test. Hepatitis B blood test. Sexually transmitted disease (STD) testing. Diabetes screening. This is done by checking your blood sugar (glucose) after you have not eaten for a while (fasting). You may have this done every 1-3 years. Mammogram. This may be done every 1-2 years. Talk to your health care provider about when you should start having regular mammograms. This may depend on whether you have a family history of breast cancer. BRCA-related cancer screening. This may be done if you have a family history of breast, ovarian, tubal, or peritoneal cancers. Pelvic exam and Pap test. This may be done every 3 years starting at age 80. Starting at age 44, this may be done every 5 years if you have a Pap test in combination with an HPV test. Bone density scan. This is done to screen for osteoporosis. You may  have this scan if you are at high risk for osteoporosis. Discuss your test results, treatment options, and if necessary, the need for more tests with your health care  provider. Vaccines  Your health care provider may recommend certain vaccines, such as: Influenza vaccine. This is recommended every year. Tetanus, diphtheria, and acellular pertussis (Tdap, Td) vaccine. You may need a Td booster every 10 years. Zoster vaccine. You may need this after age 41. Pneumococcal 13-valent conjugate (PCV13) vaccine. You may need this if you have certain conditions and were not previously vaccinated. Pneumococcal polysaccharide (PPSV23) vaccine. You may need one or two doses if you smoke cigarettes or if you have certain conditions. Talk to your health care provider about which screenings and vaccines you need and how often you need them. This information is not intended to replace advice given to you by your health care provider. Make sure you discuss any questions you have with your health care provider. Document Released: 10/17/2015 Document Revised: 06/09/2016 Document Reviewed: 07/22/2015 Elsevier Interactive Patient Education  2017 [place] Prevention in the Home Falls can cause injuries. They can happen to people of all ages. There are many things you can do to make your home safe and to help prevent falls. What can I do on the outside of my home? Regularly fix the edges of walkways and driveways and fix any cracks. Remove anything that might make you trip as you walk through a door, such as a raised step or threshold. Trim any bushes or trees on the path to your home. Use bright outdoor lighting. Clear any walking paths of anything that might make someone trip, such as rocks or tools. Regularly check to see if handrails are loose or broken. Make sure that both sides of any steps have handrails. Any raised decks and porches should have guardrails on the edges. Have any leaves, snow, or ice cleared regularly. Use sand or salt on walking paths during winter. Clean up any spills in your garage right away. This includes oil or grease spills. What  can I do in the bathroom? Use night lights. Install grab bars by the toilet and in the tub and shower. Do not use towel bars as grab bars. Use non-skid mats or decals in the tub or shower. If you need to sit down in the shower, use a plastic, non-slip stool. Keep the floor dry. Clean up any water that spills on the floor as soon as it happens. Remove soap buildup in the tub or shower regularly. Attach bath mats securely with double-sided non-slip rug tape. Do not have throw rugs and other things on the floor that can make you trip. What can I do in the bedroom? Use night lights. Make sure that you have a light by your bed that is easy to reach. Do not use any sheets or blankets that are too big for your bed. They should not hang down onto the floor. Have a firm chair that has side arms. You can use this for support while you get dressed. Do not have throw rugs and other things on the floor that can make you trip. What can I do in the kitchen? Clean up any spills right away. Avoid walking on wet floors. Keep items that you use a lot in easy-to-reach places. If you need to reach something above you, use a strong step stool that has a grab bar. Keep electrical cords out of the way. Do not use floor polish  or wax that makes floors slippery. If you must use wax, use non-skid floor wax. Do not have throw rugs and other things on the floor that can make you trip. What can I do with my stairs? Do not leave any items on the stairs. Make sure that there are handrails on both sides of the stairs and use them. Fix handrails that are broken or loose. Make sure that handrails are as long as the stairways. Check any carpeting to make sure that it is firmly attached to the stairs. Fix any carpet that is loose or worn. Avoid having throw rugs at the top or bottom of the stairs. If you do have throw rugs, attach them to the floor with carpet tape. Make sure that you have a light switch at the top of the  stairs and the bottom of the stairs. If you do not have them, ask someone to add them for you. What else can I do to help prevent falls? Wear shoes that: Do not have high heels. Have rubber bottoms. Are comfortable and fit you well. Are closed at the toe. Do not wear sandals. If you use a stepladder: Make sure that it is fully opened. Do not climb a closed stepladder. Make sure that both sides of the stepladder are locked into place. Ask someone to hold it for you, if possible. Clearly mark and make sure that you can see: Any grab bars or handrails. First and last steps. Where the edge of each step is. Use tools that help you move around (mobility aids) if they are needed. These include: Canes. Walkers. Scooters. Crutches. Turn on the lights when you go into a dark area. Replace any light bulbs as soon as they burn out. Set up your furniture so you have a clear path. Avoid moving your furniture around. If any of your floors are uneven, fix them. If there are any pets around you, be aware of where they are. Review your medicines with your doctor. Some medicines can make you feel dizzy. This can increase your chance of falling. Ask your doctor what other things that you can do to help prevent falls. This information is not intended to replace advice given to you by your health care provider. Make sure you discuss any questions you have with your health care provider. Document Released: 07/17/2009 Document Revised: 02/26/2016 Document Reviewed: 10/25/2014 Elsevier Interactive Patient Education  2017 [employer].

## 2021-09-14 NOTE — Progress Notes (Addendum)
Virtual Visit via Telephone Note  I connected with  [name] on 09/14/21 at  8:45 AM EST by telephone and verified that I am speaking with the correct person using two identifiers.  Medicare Annual Wellness visit completed telephonically due to Covid-19 pandemic.   Persons participating in this call: This Health Coach and this patient.   Location: Patient: Home Provider: Office   I discussed the limitations, risks, security and privacy concerns of performing an evaluation and management service by telephone and the availability of in person appointments. The patient expressed understanding and agreed to proceed.  Unable to perform video visit due to video visit attempted and failed and/or patient does not have video capability.   Some vital signs may be absent or patient reported.   Willette Brace, LPN   Subjective:   [name] is a 55 y.o. female who presents for Medicare Annual (Subsequent) preventive examination.  Review of Systems     Cardiac Risk Factors include: hypertension;obesity (BMI >30kg/m2)     Objective:    There were no vitals filed for this visit. There is no height or weight on file to calculate BMI.  Advanced Directives 09/14/2021 09/05/2020 07/30/2019 03/20/2019 02/20/2019 12/04/2018 07/06/2018  Does Patient Have a Medical Advance Directive? Yes No No No No No No  Type of Advance Directive [place] in Chart? No - copy requested - - - - - -  Would patient like information on creating a medical advance directive? - Yes (MAU/Ambulatory/Procedural Areas - Information given) Yes (MAU/Ambulatory/Procedural Areas - Information given) No - Patient declined No - Patient declined No - Patient declined Yes (MAU/Ambulatory/Procedural Areas - Information given)    Current Medications (verified) Outpatient Encounter Medications as of 09/14/2021  Medication Sig   albuterol (VENTOLIN HFA) 108 (90  Base) MCG/ACT inhaler Inhale 1-2 puffs into the lungs every 6 (six) hours as needed for wheezing or shortness of breath.   ARIPiprazole (ABILIFY) 10 MG tablet Take 1 tablet (10 mg total) by mouth daily.   cholecalciferol (VITAMIN D3) 25 MCG (1000 UNIT) tablet Take 5,000 Units by mouth daily.   ferrous sulfate 325 (65 FE) MG tablet Take 325 mg by mouth daily with breakfast.   lisinopril-hydrochlorothiazide (ZESTORETIC) 20-25 MG tablet Take 1 tablet by mouth daily.   metFORMIN (GLUCOPHAGE) 500 MG tablet TAKE ONE TABLET BY MOUTH TWICE A DAY WITH A MEAL   rosuvastatin (CRESTOR) 5 MG tablet Take 1 tablet (5 mg total) by mouth 3 (three) times a week.   traZODone (DESYREL) 100 MG tablet Take 1 tablet (100 mg total) by mouth at bedtime.   vitamin C (ASCORBIC ACID) 500 MG tablet Take 500 mg by mouth daily.   No facility-administered encounter medications on file as of 09/14/2021.    Allergies (verified) Patient has no known allergies.   History: Past Medical History:  Diagnosis Date   Anemia    Anxiety    Asthma    Bipolar 1 disorder ([place])    Depression    Diabetes mellitus without complication ([place])    Dyspnea 2020   with exersion   GERD (gastroesophageal reflux disease)    Heart murmur    HISTORY OF CLOSED IN 2006   History of chicken pox    History of Helicobacter pylori infection    History of migraine    Hypertension    Obesity    PFO (patent foramen  ovale)    Renal disorder    Sleep apnea    does not use cpap   Vitamin D deficiency    Past Surgical History:  Procedure Laterality Date   BIOPSY  12/04/2018   Procedure: BIOPSY;  Surgeon: Thornton Park, MD;  Location: [facility];  Service: Gastroenterology;;  EGD and Colon   BREAST REDUCTION SURGERY  1986   CHOLECYSTECTOMY N/A 03/23/2019   Procedure: LAPAROSCOPIC CHOLECYSTECTOMY;  Surgeon: Clovis Riley, MD;  Location: [facility];  Service: General;  Laterality: N/A;   COLONOSCOPY WITH PROPOFOL N/A 12/04/2018   Procedure:  COLONOSCOPY WITH PROPOFOL;  Surgeon: Thornton Park, MD;  Location: [facility];  Service: Gastroenterology;  Laterality: N/A;   ESOPHAGOGASTRODUODENOSCOPY (EGD) WITH PROPOFOL N/A 12/04/2018   Procedure: ESOPHAGOGASTRODUODENOSCOPY (EGD) WITH PROPOFOL;  Surgeon: Thornton Park, MD;  Location: [facility];  Service: Gastroenterology;  Laterality: N/A;   PATENT FORAMEN OVALE(PFO) CLOSURE  2006   TONSILLECTOMY     WISDOM TOOTH EXTRACTION     Family History  Problem Relation Age of Onset   Diabetes Mother    Hypertension Mother    Stroke Father    Diabetes Maternal Grandmother    CVA Maternal Grandmother    Diabetes Maternal Grandfather    Colon cancer Paternal Grandmother    Diabetes Paternal Grandfather    Breast cancer Maternal Aunt    Asthma Other    Cancer Other    COPD Other    Esophageal cancer Neg Hx    Rectal cancer Neg Hx    Stomach cancer Neg Hx    Liver disease Neg Hx    Social History   Socioeconomic History   Marital status: Single    Spouse name: Not on file   Number of children: Not on file   Years of education: Not on file   Highest education level: Not on file  Occupational History   Occupation: disability  Tobacco Use   Smoking status: Never   Smokeless tobacco: Never  Vaping Use   Vaping Use: Never used  Substance and Sexual Activity   Alcohol use: Yes    Alcohol/week: 1.0 - 2.0 standard drink    Types: 1 - 2 Glasses of wine per week   Drug use: No   Sexual activity: Never    Birth control/protection: None  Other Topics Concern   Not on file  Social History Narrative   Lives in [place], was living in [place], [place] for 20 years   No children, not married   Single   Family is nearby, mom and sister nearby   Currently on disability: 4 years ago she had an episode of psychosis (states that she didn't sleep for ~6-8 days prior to) and was in and out of mental institution for 6 months, lost her job and had to move back to [place] to be near family    Social Determinants of Radio broadcast assistant Strain: Low Risk    Difficulty of Paying Living Expenses: Not hard at all  Food Insecurity: No Food Insecurity   Worried About Charity fundraiser in the Last Year: Never true   Arboriculturist in the Last Year: Never true  Transportation Needs: No Transportation Needs   Lack of Transportation (Medical): No   Lack of Transportation (Non-Medical): No  Physical Activity: Insufficiently Active   Days of Exercise per Week: 4 days   Minutes of Exercise per Session: 30 min  Stress: Not on file  Social Connections: Moderately  Isolated   Frequency of Communication with Friends and Family: More than three times a week   Frequency of Social Gatherings with Friends and Family: More than three times a week   Attends Religious Services: 1 to 4 times per year   Active Member of [employer] or Organizations: No   Attends Music therapist: Never   Marital Status: Never married    Tobacco Counseling Counseling given: Not Answered   Clinical Intake:  Pre-visit preparation completed: Yes  Pain : No/denies pain     BMI - recorded: 45.31 Nutritional Status: BMI > 30  Obese Nutritional Risks: None Diabetes: No  How often do you need to have someone help you when you read instructions, pamphlets, or other written materials from your doctor or pharmacy?: 1 - Never  Diabetic?No  Interpreter Needed?: No  Information entered by :: Charlott Rakes, LPN   Activities of Daily Living In your present state of health, do you have any difficulty performing the following activities: 09/14/2021  Hearing? N  Vision? N  Difficulty concentrating or making decisions? Y  Comment at times  Walking or climbing stairs? Y  Comment SOB at times  Dressing or bathing? N  Doing errands, shopping? N  Preparing Food and eating ? N  Using the Toilet? N  In the past six months, have you accidently leaked urine? N  Do you have problems with loss of  bowel control? N  Managing your Medications? N  Managing your Finances? N  Housekeeping or managing your Housekeeping? N  Some recent data might be hidden    Patient Care Team: [name], [place] as PCP - General (Physician Assistant) Thornton Park, MD as Consulting Physician (Gastroenterology) Daron Offer, Richard Miu, MD as Consulting Physician (Psychiatry) Edythe Clarity, [facility] as Pharmacist (Pharmacist)  Indicate any recent Medical Services you may have received from other than [facility] providers in the past year (date may be approximate).     Assessment:   This is a routine wellness examination for [place].  Hearing/Vision screen Hearing Screening - Comments:: Pt denies any hearing issues  Vision Screening - Comments:: Encouraged to follow up with a eye Dr for annual eye exams   Dietary issues and exercise activities discussed: Current Exercise Habits: Home exercise routine, Type of exercise: treadmill, Time (Minutes): 30, Frequency (Times/Week): 4, Weekly Exercise (Minutes/Week): 120   Goals Addressed             This Visit's Progress    Patient Stated       Lose 40 lbs        Depression Screen PHQ 2/9 Scores 09/14/2021 09/05/2020 07/30/2019 07/06/2018 04/10/2018 03/31/2017  PHQ - 2 Score 2 1 0 1 3 3   PHQ- 9 Score 4 - - - 9 10    Fall Risk Fall Risk  09/14/2021 09/05/2020 07/30/2019 07/06/2018  Falls in the past year? 0 0 0 No  Number falls in past yr: 0 0 - -  Injury with Fall? 0 0 0 -  Risk for fall due to : Impaired vision Impaired vision - -  Follow up Falls prevention discussed Falls prevention discussed Falls evaluation completed;Education provided;Falls prevention discussed -    FALL RISK PREVENTION PERTAINING TO THE HOME:  Any stairs in or around the home? Yes  If so, are there any without handrails? Yes  Home free of loose throw rugs in walkways, pet beds, electrical cords, etc? Yes  Adequate lighting in your home to reduce risk of falls? Yes  ASSISTIVE DEVICES UTILIZED TO PREVENT FALLS:  Life alert? No  Use of a cane, walker or w/c? No  Grab bars in the bathroom? No  Shower chair or bench in shower? No  Elevated toilet seat or a handicapped toilet? No   TIMED UP AND GO:  Was the test performed? No .   Cognitive Function:     6CIT Screen 09/14/2021 09/05/2020  What Year? 0 points 0 points  What month? 0 points 0 points  What time? 0 points -  Count back from 20 0 points 0 points  Months in reverse 0 points 0 points  Repeat phrase 0 points 0 points  Total Score 0 -    Immunizations Immunization History  Administered Date(s) Administered   PFIZER(Purple Top)SARS-COV-2 Vaccination 01/05/2020, 01/29/2020, 08/11/2020   Tdap 05/12/2018    TDAP status: Up to date  Flu Vaccine status: Declined, Education has been provided regarding the importance of this vaccine but patient still declined. Advised may receive this vaccine at local pharmacy or Health Dept. Aware to provide a copy of the vaccination record if obtained from local pharmacy or Health Dept. Verbalized acceptance and understanding.  Pneumococcal vaccine status: Declined,  Education has been provided regarding the importance of this vaccine but patient still declined. Advised may receive this vaccine at local pharmacy or Health Dept. Aware to provide a copy of the vaccination record if obtained from local pharmacy or Health Dept. Verbalized acceptance and understanding.   Covid-19 vaccine status: Completed vaccines  Qualifies for Shingles Vaccine? Yes   Zostavax completed No   Shingrix Completed?: No.    Education has been provided regarding the importance of this vaccine. Patient has been advised to call insurance company to determine out of pocket expense if they have not yet received this vaccine. Advised may also receive vaccine at local pharmacy or Health Dept. Verbalized acceptance and understanding.  Screening Tests Health Maintenance  Topic Date  Due   Hepatitis C Screening  Never done   Zoster Vaccines- Shingrix (1 of 2) Never done   COVID-19 Vaccine (4 - Booster for Pfizer series) 10/06/2020   MAMMOGRAM  10/09/2021   COLONOSCOPY (Pts 45-71yrs Insurance coverage will need to be confirmed)  12/03/2021   PAP SMEAR-Modifier  07/25/2023   TETANUS/TDAP  05/12/2028   HIV Screening  Completed   Pneumococcal Vaccine 30-81 Years old  Aged Out   HPV VACCINES  Aged Out   [place] Maintenance  Health Maintenance Due  Topic Date Due   Hepatitis C Screening  Never done   Zoster Vaccines- Shingrix (1 of 2) Never done   COVID-19 Vaccine (4 - Booster for [place] series) 10/06/2020    Colorectal cancer screening: Type of screening: Colonoscopy. Completed 12/04/18. Repeat every 3 years  Mammogram status: Completed 10/10/19. Repeat every year    Additional Screening:  Hepatitis C Screening: does qualify;   Vision Screening: Recommended annual ophthalmology exams for early detection of glaucoma and other disorders of the eye. Is the patient up to date with their annual eye exam?  No  Who is the provider or what is the name of the office in which the patient attends annual eye exams? Encouraged to follow up with provider  If pt is not established with a provider, would they like to be referred to a provider to establish care? No .   Dental Screening: Recommended annual dental exams for proper oral hygiene  Community Resource Referral / Chronic Care Management:  CRR required this visit?  No   CCM required this visit?  No      Plan:     I have personally reviewed and noted the following in the patient's chart:   Medical and social history Use of alcohol, tobacco or illicit drugs  Current medications and supplements including opioid prescriptions.  Functional ability and status Nutritional status Physical activity Advanced directives List of other physicians Hospitalizations, surgeries, and ER  visits in previous 12 months Vitals Screenings to include cognitive, depression, and falls Referrals and appointments  In addition, I have reviewed and discussed with patient certain preventive protocols, quality metrics, and best practice recommendations. A written personalized care plan for preventive services as well as general preventive health recommendations were provided to patient.     Willette Brace, LPN   [id number]   Nurse Notes: Pt is requesting a psychiatrist related to her former has just closed the practice and she is in need of new services that may accept her insurance. Please advise

## 2021-10-13 NOTE — Progress Notes (Deleted)
Chronic Care Management Pharmacy Note  10/13/2021 Name:  [name] MRN:  [id number] DOB:  1965/11/24  Summary: Initial visit with PharmD.  She reports not taking her statin due to adverse effects.  (Headache, and general not feeling well.)  We discussed the risks of her elevated LDL on stroke/heart attack.  Continues positive lifestyle mods with diet and exercise.  All other meds current and patient is adherent  Recommendations/Changes made from today's visit: Consider switching to Crestor 55m three times weekly until next lipid panel.  Would benefit from at least taking some form of statin.  Plan: FU with PharmD in 6 month Check in sooner if new med started   Subjective: [name] is an 56y.o. year old female who is a primary patient of [name] PUtah  The CCM team was consulted for assistance with disease management and care coordination needs.    Engaged with patient by telephone for initial visit in response to provider referral for pharmacy case management and/or care coordination services.   Consent to Services:  The patient was given the following information about Chronic Care Management services today, agreed to services, and gave verbal consent: 1. CCM service includes personalized support from designated clinical staff supervised by the primary care provider, including individualized plan of care and coordination with other care providers 2. 24/7 contact phone numbers for assistance for urgent and routine care needs. 3. Service will only be billed when office clinical staff spend 20 minutes or more in a month to coordinate care. 4. Only one practitioner may furnish and bill the service in a calendar month. 5.The patient may stop CCM services at any time (effective at the end of the month) by phone call to the office staff. 6. The patient will be responsible for cost sharing (co-pay) of up to 20% of the service fee (after annual deductible is met). Patient agreed to  services and consent obtained.  Patient Care Team: [name] PUtahas PCP - General (Physician Assistant) BThornton Park MD as Consulting Physician (Gastroenterology) EDaron OfferARichard Miu MD as Consulting Physician (Psychiatry) DEdythe Clarity RF[facility] Servicesas Pharmacist (Pharmacist)  Recent office visits:  02/02/2021 OV PCP [name] PCentre patient has lost 80 lbs with diet and exercise. Advised that if she continues to lose weight to continue to monitor BP to see if she needs any decreased dosages. No medication changes indicated.   Recent consult visits:  None   Hospital visits:  None in previous 6 months   Objective:  Lab Results  Component Value Date   CREATININE 0.81 02/02/2021   BUN 11 02/02/2021   GFR 83.07 02/02/2021   GFRNONAA >60 03/20/2019   GFRAA >60 03/20/2019   NA 139 02/02/2021   K 4.1 02/02/2021   CALCIUM 9.1 02/02/2021   CO2 29 02/02/2021   GLUCOSE 84 02/02/2021    Lab Results  Component Value Date/Time   HGBA1C 5.5 02/02/2021 11:33 AM   HGBA1C 5.5 03/27/2020 10:10 AM   GFR 83.07 02/02/2021 11:33 AM   GFR 85.29 01/30/2019 09:56 AM    Last diabetic Eye exam:  Lab Results  Component Value Date/Time   HMDIABEYEEXA No Retinopathy 02/07/2018 12:00 AM    Last diabetic Foot exam: No results found for: HMDIABFOOTEX   Lab Results  Component Value Date   CHOL 165 02/02/2021   HDL 37.20 (L) 02/02/2021   LDLCALC 105 (H) 02/02/2021   TRIG 116.0 02/02/2021   CHOLHDL 4 02/02/2021    Hepatic  Function Latest Ref Rng & Units 02/02/2021 02/20/2019 01/30/2019  Total Protein 6.0 - 8.3 g/dL 7.4 8.4(H) 7.4  Albumin 3.5 - 5.2 g/dL 3.9 3.5 3.6  AST 0 - 37 U/L 10 18 10   ALT 0 - 35 U/L 9 18 11   Alk Phosphatase 39 - 117 U/L 70 68 78  Total Bilirubin 0.2 - 1.2 mg/dL 0.5 0.6 0.4    Lab Results  Component Value Date/Time   TSH 0.95 09/12/2018 09:16 AM   TSH 1.91 05/25/2017 11:02 AM    CBC Latest Ref Rng & Units 03/20/2019 02/20/2019 01/30/2019  WBC 4.0 -  10.5 K/uL 6.3 11.6(H) 6.6  Hemoglobin 12.0 - 15.0 g/dL 9.5(L) 9.7(L) 9.6(L)  Hematocrit 36.0 - 46.0 % 32.5(L) 31.9(L) 30.3(L)  Platelets 150 - 400 K/uL 379 413(H) 431.0(H)    Lab Results  Component Value Date/Time   VD25OH 15.51 (L) 01/30/2019 09:56 AM   VD25OH 6.01 (L) 05/25/2017 11:02 AM    Clinical ASCVD: No  The 10-year ASCVD risk score (Arnett DK, et al., 2019) is: 5.6%   Values used to calculate the score:     Age: 56 years     Sex: Female     Is Non-Hispanic African American: Yes     Diabetic: No     Tobacco smoker: No     Systolic Blood Pressure: 026 mmHg     Is BP treated: Yes     HDL Cholesterol: 37.2 mg/dL     Total Cholesterol: 165 mg/dL    Depression screen [facility] 2/9 09/14/2021 09/05/2020 07/30/2019  Decreased Interest 1 0 0  Down, Depressed, Hopeless 1 1 0  PHQ - 2 Score 2 1 0  Altered sleeping 1 - -  Tired, decreased energy 1 - -  Change in appetite 0 - -  Feeling bad or failure about yourself  0 - -  Trouble concentrating 0 - -  Moving slowly or fidgety/restless 0 - -  Suicidal thoughts 0 - -  PHQ-9 Score 4 - -  Difficult doing work/chores - - -     Social History   Tobacco Use  Smoking Status Never  Smokeless Tobacco Never   BP Readings from Last 3 Encounters:  02/02/21 128/80  02/02/21 (!) 141/80  05/19/20 100/71   Pulse Readings from Last 3 Encounters:  02/02/21 86  02/02/21 (!) 101  05/19/20 (!) 104   Wt Readings from Last 3 Encounters:  02/02/21 236 lb (107 kg)  02/02/21 232 lb (105.2 kg)  03/27/20 243 lb (110.2 kg)   BMI Readings from Last 3 Encounters:  02/02/21 46.09 kg/m  02/02/21 45.31 kg/m  03/27/20 47.46 kg/m    Assessment/Interventions: Review of patient past medical history, allergies, medications, health status, including review of consultants reports, laboratory and other test data, was performed as part of comprehensive evaluation and provision of chronic care management services.   SDOH:  (Social Determinants of  Health) assessments and interventions performed: Yes  Financial Resource Strain: Low Risk    Difficulty of Paying Living Expenses: Not hard at all    SDOH Screenings   Alcohol Screen: Not on file  Depression (PHQ2-9): Low Risk    PHQ-2 Score: 4  Financial Resource Strain: Low Risk    Difficulty of Paying Living Expenses: Not hard at all  Food Insecurity: No Food Insecurity   Worried About Charity fundraiser in the Last Year: Never true   Ran Out of Food in the Last Year: Never true  Housing: Low  Risk    Last Housing Risk Score: 0  Physical Activity: Insufficiently Active   Days of Exercise per Week: 4 days   Minutes of Exercise per Session: 30 min  Social Connections: Moderately Isolated   Frequency of Communication with Friends and Family: More than three times a week   Frequency of Social Gatherings with Friends and Family: More than three times a week   Attends Religious Services: 1 to 4 times per year   Active Member of [employer] or Organizations: No   Attends Music therapist: Never   Marital Status: Never married  Stress: Not on file  Tobacco Use: Low Risk    Smoking Tobacco Use: Never   Smokeless Tobacco Use: Never   Passive Exposure: Not on file  Transportation Needs: No Transportation Needs   Lack of Transportation (Medical): No   Lack of Transportation (Non-Medical): No    CCM Care Plan  No Known Allergies  Medications Reviewed Today     Reviewed by Willette Brace, LPN (Licensed Practical Nurse) on 09/14/21 at [phone]  Med List Status: <None>   Medication Order Taking? Sig Documenting Provider Last Dose Status Informant  albuterol (VENTOLIN HFA) 108 (90 Base) MCG/ACT inhaler [id number] Yes Inhale 1-2 puffs into the lungs every 6 (six) hours as needed for wheezing or shortness of breath. Inda [name], [place] Taking Active   ARIPiprazole (ABILIFY) 10 MG tablet [id number] Yes Take 1 tablet (10 mg total) by mouth daily. Inda [name], [place] Taking Active    cholecalciferol (VITAMIN D3) 25 MCG (1000 UNIT) tablet [id number] Yes Take 5,000 Units by mouth daily. [provider] Taking Active   ferrous sulfate 325 (65 FE) MG tablet [id number] Yes Take 325 mg by mouth daily with breakfast. [provider] Taking Active Self  lisinopril-hydrochlorothiazide (ZESTORETIC) 20-25 MG tablet [id number] Yes Take 1 tablet by mouth daily. Inda [name], [place] Taking Active   metFORMIN (GLUCOPHAGE) 500 MG tablet [id number] Yes TAKE ONE TABLET BY MOUTH TWICE A DAY WITH A MEAL Linganore, [place], [place] Taking Active   rosuvastatin (CRESTOR) 5 MG tablet [id number] Yes Take 1 tablet (5 mg total) by mouth 3 (three) times a week. Vivi Barrack, MD Taking Active   traZODone (DESYREL) 100 MG tablet [id number] Yes Take 1 tablet (100 mg total) by mouth at bedtime. Inda [name], [place] Taking Active   vitamin C (ASCORBIC ACID) 500 MG tablet [id number] Yes Take 500 mg by mouth daily. [provider] Taking Active             Patient Active Problem List   Diagnosis Date Noted   Polyp of colon    Knee pain, chronic 04/10/2018   Insulin resistance 04/10/2018   Vitamin D deficiency    Sleep apnea 03/31/2017   Morbid obesity ([place]) 03/31/2017   Hypertension 03/31/2017   Bipolar 1 disorder ([place]) 03/31/2017    Immunization History  Administered Date(s) Administered   PFIZER(Purple Top)SARS-COV-2 Vaccination 01/05/2020, 01/29/2020, 08/11/2020   Tdap 05/12/2018    Conditions to be addressed/monitored:  HTN, Bipolar 1, Insulin resistance, HLD  There are no care plans that you recently modified to display for this patient.     Medication Assistance: None required.  Patient affirms current coverage meets needs.  Compliance/Adherence/Medication fill history: Care Gaps: Zoster Vaccines  Star-Rating Drugs: Star Rating Drugs: Atorvastatin 20 mg last filled Lisinopril-HCTZ 20-25 mg last filled  Patient's preferred pharmacy is:  [id number] - [place] [facility]  [place] [address] Phone: [phone] Fax: [phone]  [id number] [address]: [phone]: [phone]  Uses pill box? Yes Pt endorses 100% compliance  We discussed: Benefits of medication synchronization, packaging and delivery as well as enhanced pharmacist oversight with [employer]. Patient decided to: Continue current medication management strategy  Care Plan and Follow Up Patient Decision:  Patient agrees to Care Plan and Follow-up.  Plan: The care management team will reach out to the patient again over the next 180 days.  CBeverly Milch PharmD Clinical Pharmacist [phone]   Current Barriers:  Unable to achieve control of cholesterol   Pharmacist Clinical Goal(s):  Patient will achieve control of cholesterol as evidenced by updated labs adhere to plan to optimize therapeutic regimen for lipids as evidenced by report of adherence to recommended medication management changes contact provider office for questions/concerns as evidenced notation of same in electronic health record through collaboration with PharmD and provider.   Interventions: 1:1 collaboration with Dana Coke PA regarding development and update of comprehensive plan of care as evidenced by provider attestation and co-signature Inter-disciplinary care team collaboration (see longitudinal plan of care) Comprehensive medication review performed; medication list updated in electronic medical record  Hypertension (BP goal <140/90) -Controlled -Current treatment: Lisinopril/HCTZ 20-227mdaily -Medications previously tried: none noted  -Current home readings: 143/83 yesterday, normally < 140/90 per patient -Current dietary habits: patient has decreased soda intake, cut back on fried foods and sweets -Current exercise  habits: she is walking 3 days per week for 30 minutes to 1 hour on treadmill at her house -Denies hypotensive/hypertensive symptoms -Educated on BP goals and benefits of medications for prevention of heart attack, stroke and kidney damage; Daily salt intake goal < 2300 mg; Exercise goal of 150 minutes per week; Importance of home blood pressure monitoring; Symptoms of hypotension and importance of maintaining adequate hydration; -Counseled to monitor BP at home a few times per week, document, and provide log at future appointments -Recommended to continue current medication  Hyperlipidemia: (LDL goal < 100) -Uncontrolled -Current treatment: Atorvastatin 2059maily - patient d/c on her own -Medications previously tried: none noted  -Current dietary patterns: see above -Current exercise habits: see above -Educated on Cholesterol goals;  Benefits of statin for ASCVD risk reduction; Importance of limiting foods high in cholesterol; -She reports she has stopped taking this medication.  It caused her to have headaches and just overall not feel good.  Symptoms have improved since she has stopped taking this medication.  I explained the benefit of statins to patient and discussed other alternatives.  Would prefer her to take one to reduce risk of heart attack/stroke. -Recommend trial of another statin such as Crestor 5mg11md start with three times per week to see if patient can tolerate this.  At lipid recheck we can evaluate increasing frequency based on tolerability and lipid levels.  Will consult with PCP.  Bipolar (Goal: Minimize symptoms) -Controlled -Current treatment: Abilify 10mg77mly -Medications previously tried/failed: doxepin, Latuda -PHQ9:  [place] Total Score 9     -GAD7:  GAD 7 : Generalized Anxiety Score 04/10/2018  Nervous, Anxious, on Edge 1  Control/stop worrying 1  Worry too much - different  things 1  Trouble relaxing 2  Restless 1  Easily annoyed or irritable 1  Afraid - awful might happen 0  Total GAD 7 Score 7  Anxiety Difficulty Somewhat difficult  -Educated on Benefits of medication for symptom control -She reports Abilify has been the best at keeping her stable -Recommended to continue current medication  Insulin Resistance (Goal: Euglycemia) -Controlled -Current treatment  Metformin 559m BID -Medications previously tried: none ntoed -Recently decreased to current dose -She reports one fasting sugar to me of 150 a few days ago.  Discussed goals for fasting and 2 hr PPG.  Denies any recent episodes of hypoglycemia or symptoms.  A1c was excellent at last recheck.  She continues excellent lifestyle changes and continued weight loss.  -Recommended to continue current medication Monitor sugar and A1c, may be able to further decrease if patient lifestyle mods continue.  Patient Goals/Self-Care Activities Patient will:  - take medications as prescribed check glucose a few times per week, document, and provide at future appointments check blood pressure periodically, document, and provide at future appointments target a minimum of 150 minutes of moderate intensity exercise weekly  Follow Up Plan: The care management team will reach out to the patient again over the next 180 days.

## 2021-10-26 ENCOUNTER — Telehealth: Payer: PPO

## 2021-10-27 ENCOUNTER — Ambulatory Visit (INDEPENDENT_AMBULATORY_CARE_PROVIDER_SITE_OTHER): Payer: PPO | Admitting: Pharmacist

## 2021-10-27 DIAGNOSIS — E8881 Metabolic syndrome: Secondary | ICD-10-CM

## 2021-10-27 DIAGNOSIS — E785 Hyperlipidemia, unspecified: Secondary | ICD-10-CM

## 2021-10-27 NOTE — Patient Instructions (Addendum)
Visit Information   Goals Addressed             This Visit's Progress    Track and Manage My Blood Pressure-Hypertension   On track    Timeframe:  Long-Range Goal Priority:  High Start Date:  04/21/21                           Expected End Date: 10/22/20                      Follow Up Date 08/02/21    - check blood pressure 3 times per week - choose a place to take my blood pressure (home, clinic or office, retail store) - write blood pressure results in a log or diary    Why is this important?   You won't feel high blood pressure, but it can still hurt your blood vessels.  High blood pressure can cause heart or kidney problems. It can also cause a stroke.  Making lifestyle changes like losing a little weight or eating less salt will help.  Checking your blood pressure at home and at different times of the day can help to control blood pressure.  If the doctor prescribes medicine remember to take it the way the doctor ordered.  Call the office if you cannot afford the medicine or if there are questions about it.     Notes:        Patient Care Plan: General Pharmacy (Adult)     Problem Identified: HTN, Bipolar 1, Insulin resistance, HLD   Priority: High  Onset Date: 04/21/2021     Long-Range Goal: Patient-Specific Goal   Start Date: 04/21/2021  Expected End Date: 10/22/2021  Recent Progress: On track  Priority: High  Note:   Current Barriers:  Unable to achieve control of cholesterol   Pharmacist Clinical Goal(s):  Patient will achieve control of cholesterol as evidenced by updated labs adhere to plan to optimize therapeutic regimen for lipids as evidenced by report of adherence to recommended medication management changes contact provider office for questions/concerns as evidenced notation of same in electronic health record through collaboration with PharmD and provider.   Interventions: 1:1 collaboration with [name], PA regarding development and  update of comprehensive plan of care as evidenced by provider attestation and co-signature Inter-disciplinary care team collaboration (see longitudinal plan of care) Comprehensive medication review performed; medication list updated in electronic medical record  Hypertension (BP goal <140/90) -Controlled -Current treatment: Lisinopril/HCTZ 20-25mg  daily -Medications previously tried: none noted  -Current home readings: 143/83 yesterday, normally < 140/90 per patient -Current dietary habits: patient has decreased soda intake, cut back on fried foods and sweets -Current exercise habits: she is walking 3 days per week for 30 minutes to 1 hour on treadmill at her house -Denies hypotensive/hypertensive symptoms -Educated on BP goals and benefits of medications for prevention of heart attack, stroke and kidney damage; Daily salt intake goal < 2300 mg; Exercise goal of 150 minutes per week; Importance of home blood pressure monitoring; Symptoms of hypotension and importance of maintaining adequate hydration; -Counseled to monitor BP at home a few times per week, document, and provide log at future appointments -Recommended to continue current medication  Hyperlipidemia: (LDL goal < 100) -Uncontrolled -Current treatment: Rosuvastatin 5mg  three times per week Appropriate, Query effective -Medications previously tried: none noted  -Current dietary patterns: see above -Current exercise habits: see above -Educated on Cholesterol goals;  Benefits  of statin for ASCVD risk reduction; Importance of limiting foods high in cholesterol; -She reports she has stopped taking this medication.  It caused her to have headaches and just overall not feel good.  Symptoms have improved since she has stopped taking this medication.  I explained the benefit of statins to patient and discussed other alternatives.  Would prefer her to take one to reduce risk of heart attack/stroke. -Recommend trial of another statin  such as Crestor 5mg  and start with three times per week to see if patient can tolerate this.  At lipid recheck we can evaluate increasing frequency based on tolerability and lipid levels.  Will consult with PCP.  Update 10/27/21 Continues to tolerate her three times per week Crestor well. Has not made appointment to recheck lipids, reminded her to do this today. Patient agreeable to follow up appt with PCP for updated labs. Hopefully LDL improved on three times per week dose.  If elevated could consider increasing to 4 times per week pending patient willingness to do so. FU on labs at that time.  Bipolar (Goal: Minimize symptoms) -Controlled -Current treatment: Abilify 10mg  daily -Medications previously tried/failed: doxepin, Latuda -PHQ9:  [facility] Office Visit from 04/10/2018 in [place]  PHQ-9 Total Score 9     -GAD7:  GAD 7 : Generalized Anxiety Score 04/10/2018  Nervous, Anxious, on Edge 1  Control/stop worrying 1  Worry too much - different things 1  Trouble relaxing 2  Restless 1  Easily annoyed or irritable 1  Afraid - awful might happen 0  Total GAD 7 Score 7  Anxiety Difficulty Somewhat difficult  -Educated on Benefits of medication for symptom control -She reports Abilify has been the best at keeping her stable -Recommended to continue current medication  Insulin Resistance (Goal: Euglycemia) -Controlled -Current treatment  Metformin 500mg  once daily Appropriate, Effective, Safe, Accessible -Medications previously tried: none ntoed -Recently decreased to current dose -She reports one fasting sugar to me of 150 a few days ago.  Discussed goals for fasting and 2 hr PPG.  Denies any recent episodes of hypoglycemia or symptoms.  A1c was excellent at last recheck.  She continues excellent lifestyle changes and continued weight loss.  -Recommended to continue current medication Monitor sugar and A1c, may be able to further decrease if  patient lifestyle mods continue.  Update 10/27/21 Based on last A1c of 5.5 - blood glucose appears to be controlled. She is not checking her glucose at home, denies any symptoms of hypoglycemia. Recommend recheck A1c at next OV - if continues to decline, could consider decreasing. However she is tolerating well and having no glycemia so I would like to keep her on metformin if possible. No changes at this time.  Patient Goals/Self-Care Activities Patient will:  - take medications as prescribed check glucose a few times per week, document, and provide at future appointments check blood pressure periodically, document, and provide at future appointments target a minimum of 150 minutes of moderate intensity exercise weekly  Follow Up Plan: The care management team will reach out to the patient again over the next 180 days.          Patient verbalizes understanding of instructions and care plan provided today and agrees to view in [place]. Active MyChart status confirmed with patient.   Telephone follow up appointment with pharmacy team member scheduled for: 6 months  Edythe Clarity, [place], PharmD Clinical Pharmacist  [facility] [phone]

## 2021-10-27 NOTE — Progress Notes (Signed)
Chronic Care Management Pharmacy Note  10/27/2021 Name:  [name] MRN:  [id number] DOB:  1966/07/09  Summary: PharmD follow up.  Tolerating Crestor well.  No taking 519m metformin.  Recommended she make appt for physical to follow up on labs.   Subjective: PShaketta RillPage is an 56y.o. year old female who is a primary patient of [name] PU[place]  The CCM team was consulted for assistance with disease management and care coordination needs.    Engaged with patient by telephone for initial visit in response to provider referral for pharmacy case management and/or care coordination services.   Consent to Services:  The patient was given the following information about Chronic Care Management services today, agreed to services, and gave verbal consent: 1. CCM service includes personalized support from designated clinical staff supervised by the primary care provider, including individualized plan of care and coordination with other care providers 2. 24/7 contact phone numbers for assistance for urgent and routine care needs. 3. Service will only be billed when office clinical staff spend 20 minutes or more in a month to coordinate care. 4. Only one practitioner may furnish and bill the service in a calendar month. 5.The patient may stop CCM services at any time (effective at the end of the month) by phone call to the office staff. 6. The patient will be responsible for cost sharing (co-pay) of up to 20% of the service fee (after annual deductible is met). Patient agreed to services and consent obtained.  Patient Care Team: [name] PU[place] PCP - General (Physician Assistant) Dana Park MD as Consulting Physician (Gastroenterology) EDaron OfferARichard Miu MD as Consulting Physician (Psychiatry) [name] (Pharmacist)  Recent office visits:  02/02/2021 OV PCP [name] patient has lost 80 lbs with diet and exercise. Advised that if she  continues to lose weight to continue to monitor BP to see if she needs any decreased dosages. No medication changes indicated.   Recent consult visits:  None   Hospital visits:  None in previous 6 months   Objective:  Lab Results  Component Value Date   CREATININE 0.81 02/02/2021   BUN 11 02/02/2021   GFR 83.07 02/02/2021   GFRNONAA >60 03/20/2019   GFRAA >60 03/20/2019   NA 139 02/02/2021   K 4.1 02/02/2021   CALCIUM 9.1 02/02/2021   CO2 29 02/02/2021   GLUCOSE 84 02/02/2021    Lab Results  Component Value Date/Time   HGBA1C 5.5 02/02/2021 11:33 AM   HGBA1C 5.5 03/27/2020 10:10 AM   GFR 83.07 02/02/2021 11:33 AM   GFR 85.29 01/30/2019 09:56 AM    Last diabetic Eye exam:  Lab Results  Component Value Date/Time   HMDIABEYEEXA No Retinopathy 02/07/2018 12:00 AM    Last diabetic Foot exam: No results found for: HMDIABFOOTEX   Lab Results  Component Value Date   CHOL 165 02/02/2021   HDL 37.20 (L) 02/02/2021   LDLCALC 105 (H) 02/02/2021   TRIG 116.0 02/02/2021   CHOLHDL 4 02/02/2021    Hepatic Function Latest Ref Rng & Units 02/02/2021 02/20/2019 01/30/2019  Total Protein 6.0 - 8.3 g/dL 7.4 8.4(H) 7.4  Albumin 3.5 - 5.2 g/dL 3.9 3.5 3.6  AST 0 - 37 U/L 10 18 10   ALT 0 - 35 U/L 9 18 11   Alk Phosphatase 39 - 117 U/L 70 68 78  Total Bilirubin 0.2 - 1.2 mg/dL 0.5 0.6 0.4    Lab Results  Component Value Date/Time   TSH 0.95 09/12/2018 09:16 AM   TSH 1.91 05/25/2017 11:02 AM    CBC Latest Ref Rng & Units 03/20/2019 02/20/2019 01/30/2019  WBC 4.0 - 10.5 K/uL 6.3 11.6(H) 6.6  Hemoglobin 12.0 - 15.0 g/dL 9.5(L) 9.7(L) 9.6(L)  Hematocrit 36.0 - 46.0 % 32.5(L) 31.9(L) 30.3(L)  Platelets 150 - 400 K/uL 379 413(H) 431.0(H)    Lab Results  Component Value Date/Time   VD25OH 15.51 (L) 01/30/2019 09:56 AM   VD25OH 6.01 (L) 05/25/2017 11:02 AM    Clinical ASCVD: No  The 10-year ASCVD risk score (Arnett DK, et al., 2019) is: 5.6%   Values used to calculate the  score:     Age: 5 years     Sex: Female     Is Non-Hispanic African American: Yes     Diabetic: No     Tobacco smoker: No     Systolic Blood Pressure: 026 mmHg     Is BP treated: Yes     HDL Cholesterol: 37.2 mg/dL     Total Cholesterol: 165 mg/dL    Depression screen [facility] 2/9 09/14/2021 09/05/2020 07/30/2019  Decreased Interest 1 0 0  Down, Depressed, Hopeless 1 1 0  PHQ - 2 Score 2 1 0  Altered sleeping 1 - -  Tired, decreased energy 1 - -  Change in appetite 0 - -  Feeling bad or failure about yourself  0 - -  Trouble concentrating 0 - -  Moving slowly or fidgety/restless 0 - -  Suicidal thoughts 0 - -  PHQ-9 Score 4 - -  Difficult doing work/chores - - -     Social History   Tobacco Use  Smoking Status Never  Smokeless Tobacco Never   BP Readings from Last 3 Encounters:  02/02/21 128/80  02/02/21 (!) 141/80  05/19/20 100/71   Pulse Readings from Last 3 Encounters:  02/02/21 86  02/02/21 (!) 101  05/19/20 (!) 104   Wt Readings from Last 3 Encounters:  02/02/21 236 lb (107 kg)  02/02/21 232 lb (105.2 kg)  03/27/20 243 lb (110.2 kg)   BMI Readings from Last 3 Encounters:  02/02/21 46.09 kg/m  02/02/21 45.31 kg/m  03/27/20 47.46 kg/m    Assessment/Interventions: Review of patient past medical history, allergies, medications, health status, including review of consultants reports, laboratory and other test data, was performed as part of comprehensive evaluation and provision of chronic care management services.   SDOH:  (Social Determinants of Health) assessments and interventions performed: Yes  Financial Resource Strain: Low Risk    Difficulty of Paying Living Expenses: Not hard at all    SDOH Screenings   Alcohol Screen: Not on file  Depression (PHQ2-9): Low Risk    PHQ-2 Score: 4  Financial Resource Strain: Low Risk    Difficulty of Paying Living Expenses: Not hard at all  Food Insecurity: No Food Insecurity   Worried About Charity fundraiser  in the Last Year: Never true   Ran Out of Food in the Last Year: Never true  Housing: Low Risk    Last Housing Risk Score: 0  Physical Activity: Insufficiently Active   Days of Exercise per Week: 4 days   Minutes of Exercise per Session: 30 min  Social Connections: Moderately Isolated   Frequency of Communication with Friends and Family: More than three times a week   Frequency of Social Gatherings with Friends and Family: More than three times a week   Attends Religious  Services: 1 to 4 times per year   Active Member of Clubs or Organizations: No   Attends Archivist Meetings: Never   Marital Status: Never married  Stress: Not on file  Tobacco Use: Low Risk    Smoking Tobacco Use: Never   Smokeless Tobacco Use: Never   Passive Exposure: Not on file  Transportation Needs: No Transportation Needs   Lack of Transportation (Medical): No   Lack of Transportation (Non-Medical): No    CCM Care Plan  No Known Allergies  Medications Reviewed Today     Reviewed by Edythe [name], [facility] (Pharmacist) on 10/27/21 at 1246  Med List Status: <None>   Medication Order Taking? Sig Documenting Provider Last Dose Status Informant  albuterol (VENTOLIN HFA) 108 (90 Base) MCG/ACT inhaler [id number] Yes Inhale 1-2 puffs into the lungs every 6 (six) hours as needed for wheezing or shortness of breath. Inda [name], [place] Taking Active   ARIPiprazole (ABILIFY) 10 MG tablet [id number] Yes Take 1 tablet (10 mg total) by mouth daily. Inda [name], [place] Taking Active   cholecalciferol (VITAMIN D3) 25 MCG (1000 UNIT) tablet [id number] Yes Take 5,000 Units by mouth daily. [provider] Taking Active   ferrous sulfate 325 (65 FE) MG tablet [id number] Yes Take 325 mg by mouth daily with breakfast. [provider] Taking Active Self  lisinopril-hydrochlorothiazide (ZESTORETIC) 20-25 MG tablet [id number] Yes Take 1 tablet by mouth daily. Inda [name], [place] Taking Active   metFORMIN  (GLUCOPHAGE) 500 MG tablet [id number] Yes TAKE ONE TABLET BY MOUTH TWICE A DAY WITH A MEAL [place], [place], [place] Taking Active            Med Note ([place]   Tue Oct 27, 2021 12:32 PM) Taking once daily  rosuvastatin (CRESTOR) 5 MG tablet [id number] Yes Take 1 tablet (5 mg total) by mouth 3 (three) times a week. Vivi Barrack, MD Taking Active   traZODone (DESYREL) 100 MG tablet [id number] Yes Take 1 tablet (100 mg total) by mouth at bedtime. Inda [name], [place] Taking Active   vitamin C (ASCORBIC ACID) 500 MG tablet [id number] Yes Take 500 mg by mouth daily. [provider] Taking Active             Patient Active Problem List   Diagnosis Date Noted   Polyp of colon    Knee pain, chronic 04/10/2018   Insulin resistance 04/10/2018   Vitamin Dana deficiency    Sleep apnea 03/31/2017   Morbid obesity ([place]) 03/31/2017   Hypertension 03/31/2017   Bipolar 1 disorder ([place]) 03/31/2017    Immunization History  Administered Date(s) Administered   PFIZER(Purple Top)SARS-COV-2 Vaccination 01/05/2020, 01/29/2020, 08/11/2020   Tdap 05/12/2018    Conditions to be addressed/monitored:  HTN, Bipolar 1, Insulin resistance, HLD  Care Plan : General Pharmacy (Adult)  Updates made by Edythe [name], RPH since 10/27/2021 12:00 AM     Problem: HTN, Bipolar 1, Insulin resistance, HLD   Priority: High  Onset Date: 04/21/2021     Long-Range Goal: Patient-Specific Goal   Start Date: 04/21/2021  Expected End Date: 10/22/2021  Recent Progress: On track  Priority: High  Note:   Current Barriers:  Unable to achieve control of cholesterol   Pharmacist Clinical Goal(s):  Patient will achieve control of cholesterol as evidenced by updated labs adhere to plan to optimize therapeutic regimen for lipids as evidenced by report of adherence to recommended medication management changes contact provider office for questions/concerns as evidenced notation of  same in electronic health  record through collaboration with PharmD and provider.   Interventions: 1:1 collaboration with [name], PA regarding development and update of comprehensive plan of care as evidenced by provider attestation and co-signature Inter-disciplinary care team collaboration (see longitudinal plan of care) Comprehensive medication review performed; medication list updated in electronic medical record  Hypertension (BP goal <140/90) -Controlled -Current treatment: Lisinopril/HCTZ 20-40m daily -Medications previously tried: none noted  -Current home readings: 143/83 yesterday, normally < 140/90 per patient -Current dietary habits: patient has decreased soda intake, cut back on fried foods and sweets -Current exercise habits: she is walking 3 days per week for 30 minutes to 1 hour on treadmill at her house -Denies hypotensive/hypertensive symptoms -Educated on BP goals and benefits of medications for prevention of heart attack, stroke and kidney damage; Daily salt intake goal < 2300 mg; Exercise goal of 150 minutes per week; Importance of home blood pressure monitoring; Symptoms of hypotension and importance of maintaining adequate hydration; -Counseled to monitor BP at home a few times per week, document, and provide log at future appointments -Recommended to continue current medication  Hyperlipidemia: (LDL goal < 100) -Uncontrolled -Current treatment: Rosuvastatin 5551mthree times per week Appropriate, Query effective -Medications previously tried: none noted  -Current dietary patterns: see above -Current exercise habits: see above -Educated on Cholesterol goals;  Benefits of statin for ASCVD risk reduction; Importance of limiting foods high in cholesterol; -She reports she has stopped taking this medication.  It caused her to have headaches and just overall not feel good.  Symptoms have improved since she has stopped taking this medication.  I explained the benefit of statins to  patient and discussed other alternatives.  Would prefer her to take one to reduce risk of heart attack/stroke. -Recommend trial of another statin such as Crestor 51m63mnd start with three times per week to see if patient can tolerate this.  At lipid recheck we can evaluate increasing frequency based on tolerability and lipid levels.  Will consult with PCP.  Update 10/27/21 Continues to tolerate her three times per week Crestor well. Has not made appointment to recheck lipids, reminded her to do this today. Patient agreeable to follow up appt with PCP for updated labs. Hopefully LDL improved on three times per week dose.  If elevated could consider increasing to 4 times per week pending patient willingness to do so. FU on labs at that time.  Bipolar (Goal: Minimize symptoms) -Controlled -Current treatment: Abilify 11m8mily -Medications previously tried/failed: doxepin, Latuda -PHQ9:  [place] Visit from [place]10/[place] Total Score 9     -GAD7:  GAD 7 : Generalized Anxiety Score 04/10/2018  Nervous, Anxious, on Edge 1  Control/stop worrying 1  Worry too much - different things 1  Trouble relaxing 2  Restless 1  Easily annoyed or irritable 1  Afraid - awful might happen 0  Total GAD 7 Score 7  Anxiety Difficulty Somewhat difficult  -Educated on Benefits of medication for symptom control -She reports Abilify has been the best at keeping her stable -Recommended to continue current medication  Insulin Resistance (Goal: Euglycemia) -Controlled -Current treatment  Metformin 500mg48me daily Appropriate, Effective, Safe, Accessible -Medications previously tried: none ntoed -Recently decreased to current dose -She reports one fasting sugar to me of 150 a few days ago.  Discussed goals for fasting and 2 hr PPG.  Denies any recent episodes of hypoglycemia or symptoms.  A1c was excellent at  last recheck.  She continues excellent lifestyle  changes and continued weight loss.  -Recommended to continue current medication Monitor sugar and A1c, may be able to further decrease if patient lifestyle mods continue.  Update 10/27/21 Based on last A1c of 5.5 - blood glucose appears to be controlled. She is not checking her glucose at home, denies any symptoms of hypoglycemia. Recommend recheck A1c at next OV - if continues to decline, could consider decreasing. However she is tolerating well and having no glycemia so I would like to keep her on metformin if possible. No changes at this time.  Patient Goals/Self-Care Activities Patient will:  - take medications as prescribed check glucose a few times per week, document, and provide at future appointments check blood pressure periodically, document, and provide at future appointments target a minimum of 150 minutes of moderate intensity exercise weekly  Follow Up Plan: The care management team will reach out to the patient again over the next 180 days.           Medication Assistance: None required.  Patient affirms current coverage meets needs.  Compliance/Adherence/Medication fill history: Care Gaps: Zoster Vaccines  Star-Rating Drugs: Star Rating Drugs: Atorvastatin 20 mg last filled Lisinopril-HCTZ 20-25 mg last filled  Patient's preferred pharmacy is:  [id number] - [address], [place] [address] Phone: [phone] Fax: [phone]  [id number] [id number]: [phone]: [phone]  Uses pill box? Yes Pt endorses 100% compliance  We discussed: Benefits of medication synchronization, packaging and delivery as well as enhanced pharmacist oversight with [employer]. Patient decided to: Continue current medication management strategy  Care Plan and Follow Up Patient Decision:  Patient agrees to Care  Plan and Follow-up.  Plan: The care management team will reach out to the patient again over the next 180 days.  CBeverly Milch PharmD Clinical Pharmacist ([phone]

## 2021-11-03 DIAGNOSIS — E785 Hyperlipidemia, unspecified: Secondary | ICD-10-CM | POA: Diagnosis not present

## 2021-11-03 DIAGNOSIS — E8881 Metabolic syndrome: Secondary | ICD-10-CM | POA: Diagnosis not present

## 2021-11-10 DIAGNOSIS — F3132 Bipolar disorder, current episode depressed, moderate: Secondary | ICD-10-CM | POA: Diagnosis not present

## 2021-12-01 DIAGNOSIS — F3132 Bipolar disorder, current episode depressed, moderate: Secondary | ICD-10-CM | POA: Diagnosis not present

## 2021-12-11 ENCOUNTER — Telehealth: Payer: Self-pay | Admitting: Pharmacist

## 2021-12-11 NOTE — Progress Notes (Incomplete)
° ° °  Chronic Care Management Pharmacy Assistant   Name: [name]  MRN: [id number] DOB: July 29, 1966  [name] is an 56 y.o. year old female who presents for his {initial or follow EH:21224825} CCM visit with the clinical pharmacist.  Reason for Encounter: {Follow-Up Reason:24148}   Conditions to be addressed/monitored: {CCM ASSESSMENT DISEASE OPTIONS:25047}  Primary concerns for visit include: ***   Recent office visits:  ***  Recent consult visits:  [facility]:  {Hospital DC Yes/No:21091515}  Medications: Outpatient Encounter Medications as of 12/11/2021  Medication Sig Note   albuterol (VENTOLIN HFA) 108 (90 Base) MCG/ACT inhaler Inhale 1-2 puffs into the lungs every 6 (six) hours as needed for wheezing or shortness of breath.    ARIPiprazole (ABILIFY) 10 MG tablet Take 1 tablet (10 mg total) by mouth daily.    cholecalciferol (VITAMIN D3) 25 MCG (1000 UNIT) tablet Take 5,000 Units by mouth daily.    ferrous sulfate 325 (65 FE) MG tablet Take 325 mg by mouth daily with breakfast.    lisinopril-hydrochlorothiazide (ZESTORETIC) 20-25 MG tablet Take 1 tablet by mouth daily.    metFORMIN (GLUCOPHAGE) 500 MG tablet TAKE ONE TABLET BY MOUTH TWICE A DAY WITH A MEAL 10/27/2021: Taking once daily   rosuvastatin (CRESTOR) 5 MG tablet Take 1 tablet (5 mg total) by mouth 3 (three) times a week.    traZODone (DESYREL) 100 MG tablet Take 1 tablet (100 mg total) by mouth at bedtime.    vitamin C (ASCORBIC ACID) 500 MG tablet Take 500 mg by mouth daily.    No facility-administered encounter medications on file as of 12/11/2021.    Care Gaps:  Star Rating Drugs:  SIG***

## 2021-12-14 DIAGNOSIS — F3181 Bipolar II disorder: Secondary | ICD-10-CM | POA: Diagnosis not present

## 2021-12-18 ENCOUNTER — Ambulatory Visit (INDEPENDENT_AMBULATORY_CARE_PROVIDER_SITE_OTHER): Payer: PPO | Admitting: Physician Assistant

## 2021-12-18 ENCOUNTER — Other Ambulatory Visit: Payer: Self-pay

## 2021-12-18 VITALS — BP 104/68 | HR 94 | Temp 97.7°F | Ht 60.0 in | Wt 243.2 lb

## 2021-12-18 DIAGNOSIS — M25511 Pain in right shoulder: Secondary | ICD-10-CM | POA: Diagnosis not present

## 2021-12-18 DIAGNOSIS — I10 Essential (primary) hypertension: Secondary | ICD-10-CM

## 2021-12-18 DIAGNOSIS — E8881 Metabolic syndrome: Secondary | ICD-10-CM | POA: Diagnosis not present

## 2021-12-18 DIAGNOSIS — F319 Bipolar disorder, unspecified: Secondary | ICD-10-CM

## 2021-12-18 DIAGNOSIS — E669 Obesity, unspecified: Secondary | ICD-10-CM | POA: Diagnosis not present

## 2021-12-18 DIAGNOSIS — E785 Hyperlipidemia, unspecified: Secondary | ICD-10-CM | POA: Diagnosis not present

## 2021-12-18 DIAGNOSIS — G8929 Other chronic pain: Secondary | ICD-10-CM

## 2021-12-18 DIAGNOSIS — Z0001 Encounter for general adult medical examination with abnormal findings: Secondary | ICD-10-CM | POA: Diagnosis not present

## 2021-12-18 LAB — COMPREHENSIVE METABOLIC PANEL
ALT: 10 U/L (ref 0–35)
AST: 12 U/L (ref 0–37)
Albumin: 4 g/dL (ref 3.5–5.2)
Alkaline Phosphatase: 74 U/L (ref 39–117)
BUN: 15 mg/dL (ref 6–23)
CO2: 29 mEq/L (ref 19–32)
Calcium: 9.6 mg/dL (ref 8.4–10.5)
Chloride: 99 mEq/L (ref 96–112)
Creatinine, Ser: 0.93 mg/dL (ref 0.40–1.20)
GFR: 69.95 mL/min (ref >60.00)
Glucose, Bld: 93 mg/dL (ref 70–99)
Potassium: 4 mEq/L (ref 3.5–5.1)
Sodium: 139 mEq/L (ref 135–145)
Total Bilirubin: 0.8 mg/dL (ref 0.2–1.2)
Total Protein: 7.8 g/dL (ref 6.0–8.3)

## 2021-12-18 LAB — LIPID PANEL
Cholesterol: 141 mg/dL (ref 0–200)
HDL: 45.6 mg/dL (ref >39.00)
LDL Cholesterol: 71 mg/dL (ref 0–99)
NonHDL: 95.61
Total CHOL/HDL Ratio: 3
Triglycerides: 125 mg/dL (ref 0.0–149.0)
VLDL: 25 mg/dL (ref 0.0–40.0)

## 2021-12-18 LAB — CBC WITH DIFFERENTIAL/PLATELET
Basophils Absolute: 0 10*3/uL (ref 0.0–0.1)
Basophils Relative: 0.1 % (ref 0.0–3.0)
Eosinophils Absolute: 0 10*3/uL (ref 0.0–0.7)
Eosinophils Relative: 0.6 % (ref 0.0–5.0)
HCT: 33.3 % — ABNORMAL LOW (ref 36.0–46.0)
Hemoglobin: 10.5 g/dL — ABNORMAL LOW (ref 12.0–15.0)
Lymphocytes Relative: 24.5 % (ref 12.0–46.0)
Lymphs Abs: 1.2 10*3/uL (ref 0.7–4.0)
MCHC: 31.6 g/dL (ref 30.0–36.0)
MCV: 84.5 fl (ref 78.0–100.0)
Monocytes Absolute: 0.3 10*3/uL (ref 0.1–1.0)
Monocytes Relative: 7 % (ref 3.0–12.0)
Neutro Abs: 3.2 10*3/uL (ref 1.4–7.7)
Neutrophils Relative %: 67.8 % (ref 43.0–77.0)
Platelets: 277 10*3/uL (ref 150.0–400.0)
RBC: 3.94 Mil/uL (ref 3.87–5.11)
RDW: 15.8 % — ABNORMAL HIGH (ref 11.5–15.5)
WBC: 4.7 10*3/uL (ref 4.0–10.5)

## 2021-12-18 LAB — HEMOGLOBIN A1C: Hgb A1c MFr Bld: 5.9 % (ref 4.6–6.5)

## 2021-12-18 NOTE — Progress Notes (Signed)
? ? ?Subjective:  ?  ?[name] is a 56 y.o. female and is here for a comprehensive physical exam. ? ?HPI ? ?Health Maintenance Due  ?Topic Date Due  ? Hepatitis C Screening  Never done  ? COLONOSCOPY (Pts 45-27yr Insurance coverage will need to be confirmed)  12/03/2021  ? ? ?Acute Concerns: ?Right Shoulder Pain  ?As the visit continued, pt admittedly reported she had been experiencing right shoulder pain for the past couple of months. Pt describes pain as a popping sensation that limits her ROM due to pain. As a result of this she is also finding it hard to fall asleep at times due to certain positions. She has not tried any medication for pain and is not interested in further evaluation at this time. Upon further discussion she is interested in trialing some exercises for possible relief. Denies numbness/tingling.  ? ?Chronic Issues: ?Insulin Resistance  ?PJamesynreports she is currently compliant with taking metformin 500 mg twice daily as of a week ago. She admits that for the past year or so she was not aware that she had to take the metformin twice daily and has been taking it only once a day. Despite this she is managing well. Denies hypoglycemic or hyperglycemic episodes.  ? ?HLD ?She has also been compliant with taking crestor 5 mg three times a week with no complications. At this time she is managing well. Denies CP or SOB.  ? ?HTN ?Pt has remained compliant with taking zestoretic 25 mg daily with no adverse effects.  At home blood pressure readings are: not checked. She does admit to experiencing slight lightheadedness but believes this to be caused by decreased water intake. Despite this she is managing well.  ? ?Patient denies chest pain, SOB, blurred vision, dizziness, unusual headaches, lower leg swelling. Denies excessive caffeine intake, stimulant usage, excessive alcohol intake, or increase in salt consumption. ? ?BP Readings from Last 3 Encounters:  ?12/18/21 104/68  ?02/02/21 128/80   ?02/02/21 (!) 141/80  ?  ? ?Bipolar Type 1 Disorder ?PArdethis currently compliant with taking Abilify 15 mg daily and trazodone 100 mg nightly with no complications. She has recently found a new psychiatrist who increased her Abilify from 10 to 15 mg daily. Reports she has found this regimen beneficial and is managing well. Denies SI/HI.  ? ? ?Hx of Colon Polyps  ?Following a routine colonoscopy on 12/04/2018, pt was found to have three 2-3 mm polyps in her rectum, descending colon, and ascending colon. There were removed successfully and found to be benign, but she was also found to have diverticulosis in her sigmoid and descending colon. Due to this she was recommended to repeat this in 3 years. At this time PVanitais overdue and is interested in repeating this if needed. Denies hematochezia, constipation, diarrhea, abdominal pain, melena, or rectal bleeding.  ? ?Health Maintenance: ?Immunizations -- Covid- UTD ?Influenza- Declined ?Tdap- UTD;2019 ?Colonoscopy -- Due; 2020 ?Mammogram -- Due; 2021 ?PAP -- UVOZ;3664?Dentistry- UTD ?Ophthalmology- Will update in April  ?Bone Density -- N/A ?Diet -- Eats all food groups  ?Sleep habits -- Trouble sleeping due to shoulder pain ?Exercise -- As able ?Weight -- Stable ?Mood -- Stable ?Weight history: ?Wt Readings from Last 10 Encounters:  ?12/18/21 243 lb 3.2 oz (110.3 kg)  ?02/02/21 236 lb (107 kg)  ?02/02/21 232 lb (105.2 kg)  ?03/27/20 243 lb (110.2 kg)  ?07/30/19 270 lb (122.5 kg)  ?03/20/19 294 lb (133.4 kg)  ?03/05/19 298  lb (135.2 kg)  ?03/01/19 298 lb (135.2 kg)  ?02/20/19 297 lb (134.7 kg)  ?12/07/18 (!) 302 lb 8 oz (137.2 kg)  ? ?Body mass index is 47.5 kg/m?Marland Kitchen ?No LMP recorded. (Menstrual status: Irregular Periods). ?Alcohol use:  reports current alcohol use of about 1.0 - 2.0 standard drink per week. ?Tobacco use:  ?Tobacco Use: Low Risk   ? Smoking Tobacco Use: Never  ? Smokeless Tobacco Use: Never  ? Passive Exposure: Not on file  ? ? ? ?Depression screen [facility]  2/9 09/14/2021  ?Decreased Interest 1  ?Down, Depressed, Hopeless 1  ?PHQ - 2 Score 2  ?Altered sleeping 1  ?Tired, decreased energy 1  ?Change in appetite 0  ?Feeling bad or failure about yourself  0  ?Trouble concentrating 0  ?Moving slowly or fidgety/restless 0  ?Suicidal thoughts 0  ?PHQ-9 Score 4  ?Difficult doing work/chores -  ? ? ? ?Other providers/specialists: ?Patient Care Team: ?[name], PA as PCP - General (Physician Assistant) ?Thornton Park, MD as Consulting Physician (Gastroenterology) ?Eksir, Richard Miu, MD as Consulting Physician (Psychiatry) ?Edythe Clarity, [facility] as Pharmacist (Pharmacist)  ? ? ?PMHx, SurgHx, SocialHx, Medications, and Allergies were reviewed in the Visit Navigator and updated as appropriate.  ? ?Past Medical History:  ?Diagnosis Date  ? Anemia   ? Anxiety   ? Asthma   ? Bipolar 1 disorder ([place])   ? Depression   ? Diabetes mellitus without complication ([place])   ? Dyspnea 2020  ? with exersion  ? GERD (gastroesophageal reflux disease)   ? Heart murmur   ? HISTORY OF CLOSED IN 2006  ? History of chicken pox   ? History of Helicobacter pylori infection   ? History of migraine   ? Hypertension   ? Obesity   ? PFO (patent foramen ovale)   ? Renal disorder   ? Sleep apnea   ? does not use cpap  ? Vitamin D deficiency   ? ? ? ?Past Surgical History:  ?Procedure Laterality Date  ? BIOPSY  12/04/2018  ? Procedure: BIOPSY;  Surgeon: Thornton Park, MD;  Location: [facility];  Service: Gastroenterology;;  EGD and Colon  ? BREAST REDUCTION SURGERY  1986  ? CHOLECYSTECTOMY N/A 03/23/2019  ? Procedure: LAPAROSCOPIC CHOLECYSTECTOMY;  Surgeon: Clovis Riley, MD;  Location: [facility];  Service: General;  Laterality: N/A;  ? COLONOSCOPY WITH PROPOFOL N/A 12/04/2018  ? Procedure: COLONOSCOPY WITH PROPOFOL;  Surgeon: Thornton Park, MD;  Location: [facility];  Service: Gastroenterology;  Laterality: N/A;  ? ESOPHAGOGASTRODUODENOSCOPY (EGD) WITH PROPOFOL N/A 12/04/2018  ?  Procedure: ESOPHAGOGASTRODUODENOSCOPY (EGD) WITH PROPOFOL;  Surgeon: Thornton Park, MD;  Location: [facility];  Service: Gastroenterology;  Laterality: N/A;  ? PATENT FORAMEN OVALE(PFO) CLOSURE  2006  ? TONSILLECTOMY    ? WISDOM TOOTH EXTRACTION    ? ? ? ?Family History  ?Problem Relation Age of Onset  ? Diabetes Mother   ? Hypertension Mother   ? Stroke Father   ? Diabetes Maternal Grandmother   ? CVA Maternal Grandmother   ? Diabetes Maternal Grandfather   ? Colon cancer Paternal Grandmother   ? Diabetes Paternal Grandfather   ? Breast cancer Maternal Aunt   ? Asthma Other   ? Cancer Other   ? COPD Other   ? Esophageal cancer Neg Hx   ? Rectal cancer Neg Hx   ? Stomach cancer Neg Hx   ? Liver disease Neg Hx   ? ? ?Social History  ? ?Tobacco  Use  ? Smoking status: Never  ? Smokeless tobacco: Never  ?Vaping Use  ? Vaping Use: Never used  ?Substance Use Topics  ? Alcohol use: Yes  ?  Alcohol/week: 1.0 - 2.0 standard drink  ?  Types: 1 - 2 Glasses of wine per week  ? Drug use: No  ? ? ?Review of Systems:  ? ?Review of Systems  ?Constitutional:  Negative for chills, fever, malaise/fatigue and weight loss.  ?HENT:  Negative for hearing loss, sinus pain and sore throat.   ?Respiratory:  Negative for cough and hemoptysis.   ?Cardiovascular:  Negative for chest pain, palpitations, leg swelling and PND.  ?Gastrointestinal:  Negative for abdominal pain, constipation, diarrhea, heartburn, nausea and vomiting.  ?Genitourinary:  Negative for dysuria, frequency and urgency.  ?Musculoskeletal:  Negative for back pain, myalgias and neck pain.  ?Skin:  Negative for itching and rash.  ?Neurological:  Negative for dizziness, tingling, seizures and headaches.  ?Endo/Heme/Allergies:  Negative for polydipsia.  ?Psychiatric/Behavioral:  Negative for depression. The patient is not nervous/anxious.   ? ? ?Objective:  ? ?BP 104/68 (BP Location: Left Arm)   Pulse 94   Temp 97.7 ?F (36.5 ?C) (Temporal)   Ht 5' (1.524 m)   Wt 243 lb  3.2 oz (110.3 kg)   SpO2 99%   BMI 47.50 kg/m?  ?Body mass index is 47.5 kg/m?. ? ? ?General Appearance:    Alert, cooperative, no distress, appears stated age  ?Head:    Normocephalic, without obvious abnormality, atrau

## 2021-12-18 NOTE — Patient Instructions (Addendum)
It was great to see you! ? ?Exercises at home for your shoulder -- try ibuprofen and exercises. If no better, let me know. ? ?Call to schedule colonoscopy and mammogram ? ?Update blood work ? ?Please go to the lab for blood work.  ? ?Our office will call you with your results unless you have chosen to receive results via MyChart. ? ?If your blood work is normal we will follow-up each year for physicals and as scheduled for chronic medical problems. ? ?If anything is abnormal we will treat accordingly and get you in for a follow-up. ? ?Take care, ? ?Aldona Bar ?  ? ? ?

## 2021-12-22 DIAGNOSIS — S46011A Strain of muscle(s) and tendon(s) of the rotator cuff of right shoulder, initial encounter: Secondary | ICD-10-CM | POA: Diagnosis not present

## 2021-12-22 DIAGNOSIS — M7581 Other shoulder lesions, right shoulder: Secondary | ICD-10-CM | POA: Diagnosis not present

## 2022-01-01 DIAGNOSIS — S46011D Strain of muscle(s) and tendon(s) of the rotator cuff of right shoulder, subsequent encounter: Secondary | ICD-10-CM | POA: Diagnosis not present

## 2022-01-07 DIAGNOSIS — E119 Type 2 diabetes mellitus without complications: Secondary | ICD-10-CM | POA: Diagnosis not present

## 2022-01-07 DIAGNOSIS — H524 Presbyopia: Secondary | ICD-10-CM | POA: Diagnosis not present

## 2022-01-07 DIAGNOSIS — Z7984 Long term (current) use of oral hypoglycemic drugs: Secondary | ICD-10-CM | POA: Diagnosis not present

## 2022-01-07 DIAGNOSIS — H5213 Myopia, bilateral: Secondary | ICD-10-CM | POA: Diagnosis not present

## 2022-01-07 DIAGNOSIS — H401131 Primary open-angle glaucoma, bilateral, mild stage: Secondary | ICD-10-CM | POA: Diagnosis not present

## 2022-01-07 LAB — HM DIABETES EYE EXAM

## 2022-01-14 ENCOUNTER — Encounter: Payer: Self-pay | Admitting: Physician Assistant

## 2022-01-25 DIAGNOSIS — F3181 Bipolar II disorder: Secondary | ICD-10-CM | POA: Diagnosis not present

## 2022-02-08 DIAGNOSIS — H401131 Primary open-angle glaucoma, bilateral, mild stage: Secondary | ICD-10-CM | POA: Diagnosis not present

## 2022-02-10 DIAGNOSIS — F3132 Bipolar disorder, current episode depressed, moderate: Secondary | ICD-10-CM | POA: Diagnosis not present

## 2022-03-10 DIAGNOSIS — F3132 Bipolar disorder, current episode depressed, moderate: Secondary | ICD-10-CM | POA: Diagnosis not present

## 2022-03-19 ENCOUNTER — Other Ambulatory Visit: Payer: Self-pay | Admitting: Physician Assistant

## 2022-03-25 ENCOUNTER — Other Ambulatory Visit: Payer: Self-pay | Admitting: Family Medicine

## 2022-03-25 DIAGNOSIS — E785 Hyperlipidemia, unspecified: Secondary | ICD-10-CM

## 2022-04-27 ENCOUNTER — Telehealth: Payer: PPO

## 2022-05-06 DIAGNOSIS — F3132 Bipolar disorder, current episode depressed, moderate: Secondary | ICD-10-CM | POA: Diagnosis not present

## 2022-05-17 DIAGNOSIS — H401131 Primary open-angle glaucoma, bilateral, mild stage: Secondary | ICD-10-CM | POA: Diagnosis not present

## 2022-06-05 ENCOUNTER — Other Ambulatory Visit: Payer: Self-pay | Admitting: Physician Assistant

## 2022-06-14 ENCOUNTER — Ambulatory Visit (HOSPITAL_COMMUNITY)
Admission: EM | Admit: 2022-06-14 | Discharge: 2022-06-14 | Disposition: A | Discharge status: Home / Self Care | Payer: PPO | Attending: Family Medicine | Admitting: Family Medicine

## 2022-06-14 ENCOUNTER — Encounter (HOSPITAL_COMMUNITY): Payer: Self-pay | Admitting: Emergency Medicine

## 2022-06-14 DIAGNOSIS — N3090 Cystitis, unspecified without hematuria: Secondary | ICD-10-CM | POA: Diagnosis not present

## 2022-06-14 LAB — POCT URINALYSIS DIPSTICK, ED / UC
Glucose, UA: NEGATIVE mg/dL
Nitrite: NEGATIVE
Protein, ur: >=300 mg/dL — AB
Specific Gravity, Urine: 1.02 (ref 1.005–1.030)
Urobilinogen, UA: 1 mg/dL (ref 0.0–1.0)
pH: 7 (ref 5.0–8.0)

## 2022-06-14 MED ORDER — CEPHALEXIN 500 MG PO CAPS: 500.0000 mg | ORAL_CAPSULE | Freq: Two times a day (BID) | ORAL | 0 refills | Status: DC

## 2022-06-14 NOTE — Discharge Instructions (Signed)
You have had labs (urine culture) sent today. We will call you with any significant abnormalities or if there is need to begin or change treatment or pursue further follow up.  You may also review your test results online through MyChart. If you do not have a MyChart account, instructions to sign up should be on your discharge paperwork.  

## 2022-06-14 NOTE — ED Triage Notes (Signed)
Pt c/o bladder pressure with urination for a couple days.

## 2022-06-16 NOTE — ED Provider Notes (Signed)
[place]    ASSESSMENT & PLAN:  1. Cystitis    Begin: Meds ordered this encounter  Medications   cephALEXin (KEFLEX) 500 MG capsule    Sig: Take 1 capsule (500 mg total) by mouth 2 (two) times daily.    Dispense:  10 capsule    Refill:  0   No signs of pyelonephritis. Will follow up with her PCP or here if not showing improvement over the next 48 hours, sooner if needed.  Outlined signs and symptoms indicating need for more acute intervention. Patient verbalized understanding. After Visit Summary given.  SUBJECTIVE:  [name] is a 56 y.o. female who complains of "bladder pressure" with urination; few days. Afebrile. No specific aggravating or alleviating factors reported. No LE edema. Normal PO intake without n/v/d. Without specific abdominal pain. Ambulatory without difficulty. No tx PTA.  LMP: Patient's last menstrual period was 07/10/2020.  OBJECTIVE:  Vitals:   06/14/22 0849  BP: (!) 136/95  Pulse: 83  Resp: 18  Temp: 98.7 F (37.1 C)  TempSrc: Oral  SpO2: 98%   General appearance: alert; no distress HENT: oropharynx: moist Lungs: unlabored respirations Abdomen: soft Back: no CVA tenderness Extremities: no edema; symmetrical with no gross deformities Skin: warm and dry Neurologic: normal gait Psychological: alert and cooperative; normal mood and affect  Labs Reviewed  POCT URINALYSIS DIPSTICK, ED / UC - Abnormal; Notable for the following components:      Result Value   Bilirubin Urine SMALL (*)    Ketones, ur TRACE (*)    Hgb urine dipstick MODERATE (*)    Protein, ur >=300 (*)    Leukocytes,Ua MODERATE (*)    All other components within normal limits    No Known Allergies  Past Medical History:  Diagnosis Date   Anemia    Anxiety    Asthma    Bipolar 1 disorder ([facility])    Depression    Diabetes mellitus without complication ([place])    Dyspnea 2020   with exersion   GERD (gastroesophageal reflux disease)    Heart murmur     HISTORY OF CLOSED IN 2006   History of chicken pox    History of Helicobacter pylori infection    History of migraine    Hypertension    Obesity    PFO (patent foramen ovale)    Renal disorder    Sleep apnea    does not use cpap   Vitamin D deficiency    Social History   Socioeconomic History   Marital status: Single    Spouse name: Not on file   Number of children: Not on file   Years of education: Not on file   Highest education level: Not on file  Occupational History   Occupation: disability  Tobacco Use   Smoking status: Never   Smokeless tobacco: Never  Vaping Use   Vaping Use: Never used  Substance and Sexual Activity   Alcohol use: Yes    Alcohol/week: 1.0 - 2.0 standard drink of alcohol    Types: 1 - 2 Glasses of wine per week   Drug use: No   Sexual activity: Never    Birth control/protection: None  Other Topics Concern   Not on file  Social History Narrative   Lives in [place], was living in [place], [place] for 20 years   No children, not married   Single   Family is nearby, mom and sister nearby   Currently on disability: 4 years ago  she had an episode of psychosis (states that she didn't sleep for ~6-8 days prior to) and was in and out of mental institution for 6 months, lost her job and had to move back to [place] to be near family   Social Determinants of Health   Financial Resource Strain: Low Risk  (09/14/2021)   Overall Financial Resource Strain (CARDIA)    Difficulty of Paying Living Expenses: Not hard at all  Food Insecurity: No Food Insecurity (09/14/2021)   Hunger Vital Sign    Worried About [place] in the Last Year: Never true    [place] in the Last Year: Never true  Transportation Needs: No Transportation Needs (09/14/2021)   PRAPARE - Hydrologist (Medical): No    Lack of Transportation (Non-Medical): No  Physical Activity: Insufficiently Active (09/14/2021)   Exercise Vital Sign    Days  of Exercise per Week: 4 days    Minutes of Exercise per Session: 30 min  Stress: No Stress Concern Present (09/05/2020)   [place]    Feeling of Stress : Only a little  Social Connections: Moderately Isolated (09/14/2021)   Social Connection and Isolation Panel [NHANES]    Frequency of Communication with Friends and Family: More than three times a week    Frequency of Social Gatherings with Friends and Family: More than three times a week    Attends Religious Services: 1 to 4 times per year    Active Member of [employer] or Organizations: No    Attends Archivist Meetings: Never    Marital Status: Never married  Intimate Partner Violence: Not At Risk (09/14/2021)   Humiliation, Afraid, Rape, and Kick questionnaire    Fear of Current or Ex-Partner: No    Emotionally Abused: No    Physically Abused: No    Sexually Abused: No   Family History  Problem Relation Age of Onset   Diabetes Mother    Hypertension Mother    Stroke Father    Diabetes Maternal Grandmother    CVA Maternal Grandmother    Diabetes Maternal Grandfather    Colon cancer Paternal Grandmother    Diabetes Paternal Grandfather    Breast cancer Maternal Aunt    Asthma Other    Cancer Other    COPD Other    Esophageal cancer Neg Hx    Rectal cancer Neg Hx    Stomach cancer Neg Hx    Liver disease Neg Hx         Vanessa Kick, MD 06/16/22 1406

## 2022-07-16 ENCOUNTER — Ambulatory Visit (HOSPITAL_COMMUNITY)
Admission: EM | Admit: 2022-07-16 | Discharge: 2022-07-16 | Disposition: A | Discharge status: Home / Self Care | Payer: PPO | Attending: Family Medicine | Admitting: Family Medicine

## 2022-07-16 ENCOUNTER — Encounter (HOSPITAL_COMMUNITY): Payer: Self-pay

## 2022-07-16 DIAGNOSIS — N3090 Cystitis, unspecified without hematuria: Secondary | ICD-10-CM | POA: Insufficient documentation

## 2022-07-16 LAB — POCT URINALYSIS DIPSTICK, ED / UC
Bilirubin Urine: NEGATIVE
Glucose, UA: NEGATIVE mg/dL
Ketones, ur: NEGATIVE mg/dL
Nitrite: NEGATIVE
Protein, ur: >=300 mg/dL — AB
Specific Gravity, Urine: 1.02 (ref 1.005–1.030)
Urobilinogen, UA: 0.2 mg/dL (ref 0.0–1.0)
pH: 6 (ref 5.0–8.0)

## 2022-07-16 MED ORDER — CIPROFLOXACIN HCL 500 MG PO TABS: 500.0000 mg | ORAL_TABLET | Freq: Two times a day (BID) | ORAL | 0 refills | Status: AC

## 2022-07-16 NOTE — ED Provider Notes (Signed)
[place]    CSN: [id number] Arrival date & time: 07/16/22  6283      History   Chief Complaint Chief Complaint  Patient presents with   Urinary Tract Infection    HPI [name] is a 56 y.o. female.    Urinary Tract Infection  Here for dysuria and incomplete bladder emptying that began this morning.  Also having urinary frequency.  She has a little bit of right low back pain around the LS area.  No fever or vomiting and no chills.  She was treated for a UTI about 1 month ago here with Keflex.  Those symptoms did completely resolve with antibiotics and are just now coming back  Past Medical History:  Diagnosis Date   Anemia    Anxiety    Asthma    Bipolar 1 disorder ([place])    Depression    Diabetes mellitus without complication ([place])    Dyspnea 2020   with exersion   GERD (gastroesophageal reflux disease)    Heart murmur    HISTORY OF CLOSED IN 2006   History of chicken pox    History of Helicobacter pylori infection    History of migraine    Hypertension    Obesity    PFO (patent foramen ovale)    Renal disorder    Sleep apnea    does not use cpap   Vitamin D deficiency     Patient Active Problem List   Diagnosis Date Noted   Polyp of colon    Knee pain, chronic 04/10/2018   Insulin resistance 04/10/2018   Vitamin D deficiency    Sleep apnea 03/31/2017   Morbid obesity ([place]) 03/31/2017   Hypertension 03/31/2017   Bipolar 1 disorder ([place]) 03/31/2017    Past Surgical History:  Procedure Laterality Date   BIOPSY  12/04/2018   Procedure: BIOPSY;  Surgeon: Thornton Park, MD;  Location: [facility];  Service: Gastroenterology;;  EGD and Colon   BREAST REDUCTION SURGERY  1986   CHOLECYSTECTOMY N/A 03/23/2019   Procedure: LAPAROSCOPIC CHOLECYSTECTOMY;  Surgeon: Clovis Riley, MD;  Location: [facility];  Service: General;  Laterality: N/A;   COLONOSCOPY WITH PROPOFOL N/A 12/04/2018   Procedure: COLONOSCOPY WITH PROPOFOL;  Surgeon:  Thornton Park, MD;  [facility];  Service: Gastroenterology;  Laterality: N/A;   ESOPHAGOGASTRODUODENOSCOPY (EGD) WITH PROPOFOL N/A 12/04/2018   Procedure: ESOPHAGOGASTRODUODENOSCOPY (EGD) WITH PROPOFOL;  Surgeon: Thornton Park, MD;  [facility];  Service: Gastroenterology;  Laterality: N/A;   PATENT FORAMEN OVALE(PFO) CLOSURE  2006   TONSILLECTOMY     WISDOM TOOTH EXTRACTION      OB History   No obstetric history on file.      Home Medications    Prior to Admission medications   Medication Sig Start Date End Date Taking? Authorizing Provider  ciprofloxacin (CIPRO) 500 MG tablet Take 1 tablet (500 mg total) by mouth 2 (two) times daily for 5 days. 07/16/22 07/21/22 Yes Cloie Wooden, Gwenlyn Perking, MD  albuterol (VENTOLIN HFA) 108 (90 Base) MCG/ACT inhaler Inhale 1-2 puffs into the lungs every 6 (six) hours as needed for wheezing or shortness of breath. 02/02/21   [name], PA  ARIPiprazole (ABILIFY) 10 MG tablet Take 1 tablet (10 mg total) by mouth daily. 09/02/21   [name], PA  cholecalciferol (VITAMIN D3) 25 MCG (1000 UNIT) tablet Take 5,000 Units by mouth daily.    [provider]  ferrous sulfate 325 (65 FE) MG tablet Take 325 mg  by mouth daily with breakfast.    [provider]  hydrOXYzine (ATARAX) 25 MG tablet Take 25 mg by mouth 3 (three) times daily.    [provider]  lisinopril-hydrochlorothiazide (ZESTORETIC) 20-25 MG tablet TAKE ONE TABLET BY MOUTH DAILY 03/19/22   [name], PA  metFORMIN (GLUCOPHAGE) 500 MG tablet TAKE ONE TABLET BY MOUTH TWICE A DAY WITH MEALS 06/08/22   [name], PA  rosuvastatin (CRESTOR) 5 MG tablet TAKE 1 TABLET BY MOUTH 3 TIMES A WEEK 03/25/22   [name], PA  traZODone (DESYREL) 100 MG tablet Take 1 tablet (100 mg total) by mouth at bedtime. 09/02/21   [name], PA  vitamin C (ASCORBIC ACID) 500 MG tablet Take 500 mg by mouth daily.    [provider]     Family History Family History  Problem Relation Age of Onset   Diabetes Mother    Hypertension Mother    Stroke Father    Diabetes Maternal Grandmother    CVA Maternal Grandmother    Diabetes Maternal Grandfather    Colon cancer Paternal Grandmother    Diabetes Paternal Grandfather    Breast cancer Maternal Aunt    Asthma Other    Cancer Other    COPD Other    Esophageal cancer Neg Hx    Rectal cancer Neg Hx    Stomach cancer Neg Hx    Liver disease Neg Hx     Social History Social History   Tobacco Use   Smoking status: Never   Smokeless tobacco: Never  Vaping Use   Vaping Use: Never used  Substance Use Topics   Alcohol use: Yes    Alcohol/week: 1.0 - 2.0 standard drink of alcohol    Types: 1 - 2 Glasses of wine per week   Drug use: No     Allergies   Patient has no known allergies.   Review of Systems Review of Systems   Physical Exam Triage Vital Signs ED Triage Vitals  Enc Vitals Group     BP 07/16/22 0829 122/81     Pulse Rate 07/16/22 0831 82     Resp 07/16/22 0829 18     Temp 07/16/22 0829 98.9 F (37.2 C)     Temp Source 07/16/22 0829 Oral     SpO2 07/16/22 0829 98 %     Weight --      Height --      Head Circumference --      Peak Flow --      Pain Score --      Pain Loc --      Pain Edu? --      Excl. in [place]? --    No data found.  Updated Vital Signs BP 122/81 (BP Location: Left Arm)   Pulse 82   Temp 98.9 F (37.2 C) (Oral)   Resp 18   LMP 07/10/2020   SpO2 98%   Visual Acuity Right Eye Distance:   Left Eye Distance:   Bilateral Distance:    Right Eye Near:   Left Eye Near:    Bilateral Near:     Physical Exam Vitals reviewed.  Constitutional:      General: She is not in acute distress.    Appearance: She is not ill-appearing, toxic-appearing or diaphoretic.  HENT:     Mouth/Throat:     Mouth: Mucous membranes are moist.  Cardiovascular:     Rate and Rhythm: Normal rate and regular rhythm.  Heart sounds:  No murmur heard. Pulmonary:     Effort: Pulmonary effort is normal.     Breath sounds: Normal breath sounds.  Abdominal:     Palpations: Abdomen is soft.     Tenderness: There is no abdominal tenderness. There is no right CVA tenderness or left CVA tenderness.  Musculoskeletal:        General: No tenderness.  Skin:    Coloration: Skin is not jaundiced or pale.  Neurological:     General: No focal deficit present.     Mental Status: She is alert and oriented to person, place, and time.  Psychiatric:        Behavior: Behavior normal.      UC Treatments / Results  Labs (all labs ordered are listed, but only abnormal results are displayed) Labs Reviewed  POCT URINALYSIS DIPSTICK, ED / UC - Abnormal; Notable for the following components:      Result Value   Hgb urine dipstick LARGE (*)    Protein, ur >=300 (*)    Leukocytes,Ua MODERATE (*)    All other components within normal limits  URINE CULTURE    EKG   Radiology No results found.  Procedures Procedures (including critical care time) white blood cells and red blood cells and protein.  This is most likely consistent with another bladder infection  Medications Ordered in UC Medications - No data to display  Initial Impression / Assessment and Plan / UC Course  I have reviewed the triage vital signs and the nursing notes.  Pertinent labs & imaging results that were available during my care of the patient were reviewed by me and considered in my medical decision making (see chart for details).         Her urinalysis shows large amount of blood, large amount of protein, and moderate white cells.  We will treat with Cipro for the UTI, and culture the urine.  I have asked her to talk with her primary care about these recurrent UTIs.  Urine culture sent Final diagnoses:  Cystitis     Discharge Instructions      The urinalysis showed some white blood cells and red blood cells and protein.  This is most likely  consistent with a bladder infection.  Take Cipro 500 mg--1 tablet 2 times daily for 7 days  Urine culture is sent, and staff will notify you if you need a different antibiotic from the one we sent  He would be a good idea to see your primary care provider about this recurrent issue.     ED Prescriptions     Medication Sig Dispense Auth. Provider   ciprofloxacin (CIPRO) 500 MG tablet Take 1 tablet (500 mg total) by mouth 2 (two) times daily for 5 days. 10 tablet Windy Carina Gwenlyn Perking, MD      PDMP not reviewed this encounter.   Barrett Henle, MD 07/16/22 [phone]

## 2022-07-16 NOTE — Discharge Instructions (Addendum)
The urinalysis showed some white blood cells and red blood cells and protein.  This is most likely consistent with a bladder infection.  Take Cipro 500 mg--1 tablet 2 times daily for 7 days  Urine culture is sent, and staff will notify you if you need a different antibiotic from the one we sent  He would be a good idea to see your primary care provider about this recurrent issue.

## 2022-07-16 NOTE — ED Triage Notes (Signed)
Pt c/o dysuria x 2-3 days, Pt reports this has been recurrent.

## 2022-07-17 LAB — URINE CULTURE: Culture: >=100000 — AB

## 2022-08-23 DIAGNOSIS — H401131 Primary open-angle glaucoma, bilateral, mild stage: Secondary | ICD-10-CM | POA: Diagnosis not present

## 2022-09-08 ENCOUNTER — Other Ambulatory Visit: Payer: Self-pay | Admitting: Physician Assistant

## 2022-09-23 ENCOUNTER — Telehealth: Payer: Self-pay | Admitting: Gastroenterology

## 2022-09-23 ENCOUNTER — Ambulatory Visit (INDEPENDENT_AMBULATORY_CARE_PROVIDER_SITE_OTHER): Payer: PPO

## 2022-09-23 VITALS — Wt 246.0 lb

## 2022-09-23 DIAGNOSIS — Z1211 Encounter for screening for malignant neoplasm of colon: Secondary | ICD-10-CM

## 2022-09-23 DIAGNOSIS — Z1231 Encounter for screening mammogram for malignant neoplasm of breast: Secondary | ICD-10-CM | POA: Diagnosis not present

## 2022-09-23 DIAGNOSIS — Z0000 Encounter for general adult medical examination without abnormal findings: Secondary | ICD-10-CM

## 2022-09-23 NOTE — Telephone Encounter (Signed)
Inbound call from patient stating she has a referral to have a repeat colon with Dr. Tarri Glenn and would like to schedule. Patient had last procedure at [facility]. Patient is requesting a call back to discuss scheduling. Please advise.

## 2022-09-23 NOTE — Patient Instructions (Signed)
[name] , Thank you for taking time to come for your Medicare Wellness Visit. I appreciate your ongoing commitment to your health goals. Please review the following plan we discussed and let me know if I can assist you in the future.   These are the goals we discussed:  Goals      Patient Stated     Lose 38 lbs      Patient Stated     Lose 40 lbs      Patient Stated     Lose more weight 50lbs      Track and Manage My Blood Pressure-Hypertension     Timeframe:  Long-Range Goal Priority:  High Start Date:  04/21/21                           Expected End Date: 10/22/20                      Follow Up Date 08/02/21    - check blood pressure 3 times per week - choose a place to take my blood pressure (home, clinic or office, retail store) - write blood pressure results in a log or diary    Why is this important?   You won't feel high blood pressure, but it can still hurt your blood vessels.  High blood pressure can cause heart or kidney problems. It can also cause a stroke.  Making lifestyle changes like losing a little weight or eating less salt will help.  Checking your blood pressure at home and at different times of the day can help to control blood pressure.  If the doctor prescribes medicine remember to take it the way the doctor ordered.  Call the office if you cannot afford the medicine or if there are questions about it.     Notes:         This is a list of the screening recommended for you and due dates:  Health Maintenance  Topic Date Due   Hepatitis C Screening: USPSTF Recommendation to screen - Ages 65-79 yo.  Never done   Zoster (Shingles) Vaccine (1 of 2) Never done   Mammogram  10/09/2021   Colon Cancer Screening  12/03/2021   COVID-19 Vaccine (4 - 2023-24 season) 06/04/2022   Pap Smear  07/25/2023   Medicare Annual Wellness Visit  09/24/2023   DTaP/Tdap/Td vaccine (2 - Td or Tdap) 05/12/2028   HIV Screening  Completed   HPV Vaccine  Aged Out   Flu Shot   Discontinued    Advanced directives: Advance directive discussed with you today. Even though you declined this today please call our office should you change your mind and we can give you the proper paperwork for you to fill out.  Conditions/risks identified: lose 50 lbs   Next appointment: Follow up in one year for your annual wellness visit.   Preventive Care 40-64 Years, Female Preventive care refers to lifestyle choices and visits with your health care provider that can promote health and wellness. What does preventive care include? A yearly physical exam. This is also called an annual well check. Dental exams once or twice a year. Routine eye exams. Ask your health care provider how often you should have your eyes checked. Personal lifestyle choices, including: Daily care of your teeth and gums. Regular physical activity. Eating a healthy diet. Avoiding tobacco and drug use. Limiting alcohol use. Practicing safe sex. Taking low-dose aspirin  daily starting at age 27. Taking vitamin and mineral supplements as recommended by your health care provider. What happens during an annual well check? The services and screenings done by your health care provider during your annual well check will depend on your age, overall health, lifestyle risk factors, and family history of disease. Counseling  Your health care provider may ask you questions about your: Alcohol use. Tobacco use. Drug use. Emotional well-being. Home and relationship well-being. Sexual activity. Eating habits. Work and work Statistician. Method of birth control. Menstrual cycle. Pregnancy history. Screening  You may have the following tests or measurements: Height, weight, and BMI. Blood pressure. Lipid and cholesterol levels. These may be checked every 5 years, or more frequently if you are over 52 years old. Skin check. Lung cancer screening. You may have this screening every year starting at age 34 if you  have a 30-pack-year history of smoking and currently smoke or have quit within the past 15 years. Fecal occult blood test (FOBT) of the stool. You may have this test every year starting at age 49. Flexible sigmoidoscopy or colonoscopy. You may have a sigmoidoscopy every 5 years or a colonoscopy every 10 years starting at age 78. Hepatitis C blood test. Hepatitis B blood test. Sexually transmitted disease (STD) testing. Diabetes screening. This is done by checking your blood sugar (glucose) after you have not eaten for a while (fasting). You may have this done every 1-3 years. Mammogram. This may be done every 1-2 years. Talk to your health care provider about when you should start having regular mammograms. This may depend on whether you have a family history of breast cancer. BRCA-related cancer screening. This may be done if you have a family history of breast, ovarian, tubal, or peritoneal cancers. Pelvic exam and Pap test. This may be done every 3 years starting at age 2. Starting at age 12, this may be done every 5 years if you have a Pap test in combination with an HPV test. Bone density scan. This is done to screen for osteoporosis. You may have this scan if you are at high risk for osteoporosis. Discuss your test results, treatment options, and if necessary, the need for more tests with your health care provider. Vaccines  Your health care provider may recommend certain vaccines, such as: Influenza vaccine. This is recommended every year. Tetanus, diphtheria, and acellular pertussis (Tdap, Td) vaccine. You may need a Td booster every 10 years. Zoster vaccine. You may need this after age 26. Pneumococcal 13-valent conjugate (PCV13) vaccine. You may need this if you have certain conditions and were not previously vaccinated. Pneumococcal polysaccharide (PPSV23) vaccine. You may need one or two doses if you smoke cigarettes or if you have certain conditions. Talk to your health care provider  about which screenings and vaccines you need and how often you need them. This information is not intended to replace advice given to you by your health care provider. Make sure you discuss any questions you have with your health care provider. Document Released: 10/17/2015 Document Revised: 06/09/2016 Document Reviewed: 07/22/2015 Elsevier Interactive Patient Education  2017 [place] Prevention in the Home Falls can cause injuries. They can happen to people of all ages. There are many things you can do to make your home safe and to help prevent falls. What can I do on the outside of my home? Regularly fix the edges of walkways and driveways and fix any cracks. Remove anything that might make  you trip as you walk through a door, such as a raised step or threshold. Trim any bushes or trees on the path to your home. Use bright outdoor lighting. Clear any walking paths of anything that might make someone trip, such as rocks or tools. Regularly check to see if handrails are loose or broken. Make sure that both sides of any steps have handrails. Any raised decks and porches should have guardrails on the edges. Have any leaves, snow, or ice cleared regularly. Use sand or salt on walking paths during winter. Clean up any spills in your garage right away. This includes oil or grease spills. What can I do in the bathroom? Use night lights. Install grab bars by the toilet and in the tub and shower. Do not use towel bars as grab bars. Use non-skid mats or decals in the tub or shower. If you need to sit down in the shower, use a plastic, non-slip stool. Keep the floor dry. Clean up any water that spills on the floor as soon as it happens. Remove soap buildup in the tub or shower regularly. Attach bath mats securely with double-sided non-slip rug tape. Do not have throw rugs and other things on the floor that can make you trip. What can I do in the bedroom? Use night lights. Make sure  that you have a light by your bed that is easy to reach. Do not use any sheets or blankets that are too big for your bed. They should not hang down onto the floor. Have a firm chair that has side arms. You can use this for support while you get dressed. Do not have throw rugs and other things on the floor that can make you trip. What can I do in the kitchen? Clean up any spills right away. Avoid walking on wet floors. Keep items that you use a lot in easy-to-reach places. If you need to reach something above you, use a strong step stool that has a grab bar. Keep electrical cords out of the way. Do not use floor polish or wax that makes floors slippery. If you must use wax, use non-skid floor wax. Do not have throw rugs and other things on the floor that can make you trip. What can I do with my stairs? Do not leave any items on the stairs. Make sure that there are handrails on both sides of the stairs and use them. Fix handrails that are broken or loose. Make sure that handrails are as long as the stairways. Check any carpeting to make sure that it is firmly attached to the stairs. Fix any carpet that is loose or worn. Avoid having throw rugs at the top or bottom of the stairs. If you do have throw rugs, attach them to the floor with carpet tape. Make sure that you have a light switch at the top of the stairs and the bottom of the stairs. If you do not have them, ask someone to add them for you. What else can I do to help prevent falls? Wear shoes that: Do not have high heels. Have rubber bottoms. Are comfortable and fit you well. Are closed at the toe. Do not wear sandals. If you use a stepladder: Make sure that it is fully opened. Do not climb a closed stepladder. Make sure that both sides of the stepladder are locked into place. Ask someone to hold it for you, if possible. Clearly mark and make sure that you can see: Any grab bars  or handrails. First and last steps. Where the edge of  each step is. Use tools that help you move around (mobility aids) if they are needed. These include: Canes. Walkers. Scooters. Crutches. Turn on the lights when you go into a dark area. Replace any light bulbs as soon as they burn out. Set up your furniture so you have a clear path. Avoid moving your furniture around. If any of your floors are uneven, fix them. If there are any pets around you, be aware of where they are. Review your medicines with your doctor. Some medicines can make you feel dizzy. This can increase your chance of falling. Ask your doctor what other things that you can do to help prevent falls. This information is not intended to replace advice given to you by your health care provider. Make sure you discuss any questions you have with your health care provider. Document Released: 07/17/2009 Document Revised: 02/26/2016 Document Reviewed: 10/25/2014 Elsevier Interactive Patient Education  2017 [employer].

## 2022-09-23 NOTE — Telephone Encounter (Signed)
Spoke with patient & advised that I would discuss colon with Dr. Tarri Glenn when she returns to office before scheduling hospital procedure. Pt verbalized all understanding.

## 2022-09-23 NOTE — Progress Notes (Signed)
I connected with  Nautika Cressey Mantei on 09/23/22 by a audio enabled telemedicine application and verified that I am speaking with the correct person using two identifiers.  Patient Location: Home  Provider Location: Office/Clinic  I discussed the limitations of evaluation and management by telemedicine. The patient expressed understanding and agreed to proceed.   Subjective:   Boneta Standre Lanting is a 56 y.o. female who presents for Medicare Annual (Subsequent) preventive examination.  Review of Systems     Cardiac Risk Factors include: hypertension;obesity (BMI >30kg/m2)     Objective:    Today's Vitals   09/23/22 0925  Weight: 246 lb (111.6 kg)   Body mass index is 48.04 kg/m.     09/23/2022    9:29 AM 09/14/2021    8:56 AM 09/05/2020    9:04 AM 07/30/2019   12:39 PM 03/20/2019   10:43 AM 02/20/2019    6:02 AM 12/04/2018    6:58 AM  Advanced Directives  Does Patient Have a Medical Advance Directive? No Yes No No No No No  Type of Advance Directive  [place] in Chart?  No - copy requested       Would patient like information on creating a medical advance directive? No - Patient declined  Yes (MAU/Ambulatory/Procedural Areas - Information given) Yes (MAU/Ambulatory/Procedural Areas - Information given) No - Patient declined No - Patient declined No - Patient declined    Current Medications (verified) Outpatient Encounter Medications as of 09/23/2022  Medication Sig   albuterol (VENTOLIN HFA) 108 (90 Base) MCG/ACT inhaler Inhale 1-2 puffs into the lungs every 6 (six) hours as needed for wheezing or shortness of breath.   ARIPiprazole (ABILIFY) 10 MG tablet Take 1 tablet (10 mg total) by mouth daily.   cholecalciferol (VITAMIN D3) 25 MCG (1000 UNIT) tablet Take 5,000 Units by mouth daily.   ferrous sulfate 325 (65 FE) MG tablet Take 325 mg by mouth daily with breakfast.   hydrOXYzine (ATARAX) 25 MG tablet Take 25 mg by mouth  3 (three) times daily.   latanoprost (XALATAN) 0.005 % ophthalmic solution INSTILL 1 DROP INTO BOTH EYES NIGHTLY   lisinopril-hydrochlorothiazide (ZESTORETIC) 20-25 MG tablet TAKE ONE TABLET BY MOUTH DAILY   metFORMIN (GLUCOPHAGE) 500 MG tablet TAKE 1 TABLET BY MOUTH TWICE A DAY WITH MEALS   rosuvastatin (CRESTOR) 5 MG tablet TAKE 1 TABLET BY MOUTH 3 TIMES A WEEK   traZODone (DESYREL) 100 MG tablet Take 1 tablet (100 mg total) by mouth at bedtime.   vitamin C (ASCORBIC ACID) 500 MG tablet Take 500 mg by mouth daily.   No facility-administered encounter medications on file as of 09/23/2022.    Allergies (verified) Patient has no known allergies.   History: Past Medical History:  Diagnosis Date   Anemia    Anxiety    Asthma    Bipolar 1 disorder ([place])    Depression    Diabetes mellitus without complication ([place])    Dyspnea 2020   with exersion   GERD (gastroesophageal reflux disease)    Heart murmur    HISTORY OF CLOSED IN 2006   History of chicken pox    History of Helicobacter pylori infection    History of migraine    Hypertension    Obesity    PFO (patent foramen ovale)    Renal disorder    Sleep apnea    does not use cpap   Vitamin  D deficiency    Past Surgical History:  Procedure Laterality Date   BIOPSY  12/04/2018   Procedure: BIOPSY;  Surgeon: Thornton Park, MD;  Location: [facility];  Service: Gastroenterology;;  EGD and Colon   BREAST REDUCTION SURGERY  1986   CHOLECYSTECTOMY N/A 03/23/2019   Procedure: LAPAROSCOPIC CHOLECYSTECTOMY;  Surgeon: Clovis Riley, MD;  Location: [facility];  Service: General;  Laterality: N/A;   COLONOSCOPY WITH PROPOFOL N/A 12/04/2018   Procedure: COLONOSCOPY WITH PROPOFOL;  Surgeon: Thornton Park, MD;  Location: [facility];  Service: Gastroenterology;  Laterality: N/A;   ESOPHAGOGASTRODUODENOSCOPY (EGD) WITH PROPOFOL N/A 12/04/2018   Procedure: ESOPHAGOGASTRODUODENOSCOPY (EGD) WITH PROPOFOL;  Surgeon: Thornton Park, MD;   [facility];  Service: Gastroenterology;  Laterality: N/A;   PATENT FORAMEN OVALE(PFO) CLOSURE  2006   TONSILLECTOMY     WISDOM TOOTH EXTRACTION     Family History  Problem Relation Age of Onset   Diabetes Mother    Hypertension Mother    Stroke Father    Diabetes Maternal Grandmother    CVA Maternal Grandmother    Diabetes Maternal Grandfather    Colon cancer Paternal Grandmother    Diabetes Paternal Grandfather    Breast cancer Maternal Aunt    Asthma Other    Cancer Other    COPD Other    Esophageal cancer Neg Hx    Rectal cancer Neg Hx    Stomach cancer Neg Hx    Liver disease Neg Hx    Social History   Socioeconomic History   Marital status: Single    Spouse name: Not on file   Number of children: Not on file   Years of education: Not on file   Highest education level: Not on file  Occupational History   Occupation: disability  Tobacco Use   Smoking status: Never   Smokeless tobacco: Never  Vaping Use   Vaping Use: Never used  Substance and Sexual Activity   Alcohol use: Yes    Alcohol/week: 1.0 - 2.0 standard drink of alcohol    Types: 1 - 2 Glasses of wine per week   Drug use: No   Sexual activity: Never    Birth control/protection: None  Other Topics Concern   Not on file  Social History Narrative   Lives in [place], was living in [place], [place] for 20 years   No children, not married   Single   Family is nearby, mom and sister nearby   Currently on disability: 4 years ago she had an episode of psychosis (states that she didn't sleep for ~6-8 days prior to) and was in and out of mental institution for 6 months, lost her job and had to move back to [place] to be near family   Social Determinants of Health   Financial Resource Strain: Low Risk  (09/23/2022)   Overall Financial Resource Strain (CARDIA)    Difficulty of Paying Living Expenses: Not hard at all  Food Insecurity: No Food Insecurity (09/23/2022)   Hunger Vital Sign    Worried About  Running Out of Food in the Last Year: Never true    [place] in the Last Year: Never true  Transportation Needs: No Transportation Needs (09/23/2022)   PRAPARE - Hydrologist (Medical): No    Lack of Transportation (Non-Medical): No  Physical Activity: Insufficiently Active (09/23/2022)   Exercise Vital Sign    Days of Exercise per Week: 3 days    Minutes of  Exercise per Session: 30 min  Stress: No Stress Concern Present (09/23/2022)   [place]    Feeling of Stress : Only a little  Social Connections: Moderately Isolated (09/23/2022)   Social Connection and Isolation Panel [NHANES]    Frequency of Communication with Friends and Family: More than three times a week    Frequency of Social Gatherings with Friends and Family: More than three times a week    Attends Religious Services: 1 to 4 times per year    Active Member of [employer] or Organizations: No    Attends Music therapist: Never    Marital Status: Never married    Tobacco Counseling Counseling given: Not Answered   Clinical Intake:  Pre-visit preparation completed: Yes  Pain : No/denies pain     BMI - recorded: 48.04 Nutritional Status: BMI > 30  Obese Nutritional Risks: None Diabetes: No  How often do you need to have someone help you when you read instructions, pamphlets, or other written materials from your doctor or pharmacy?: 1 - Never  Diabetic?no  Interpreter Needed?: No  Information entered by :: Charlott Rakes, LPN   Activities of Daily Living    09/23/2022    9:30 AM 09/19/2022    9:48 AM  In your present state of health, do you have any difficulty performing the following activities:  Hearing? 0 0  Vision? 0 0  Difficulty concentrating or making decisions? 0 0  Walking or climbing stairs? 0 1  Dressing or bathing? 0 0  Doing errands, shopping? 0 0  Preparing Food and eating ? N  N  Using the Toilet? N N  In the past six months, have you accidently leaked urine? N N  Do you have problems with loss of bowel control? N N  Managing your Medications? N N  Managing your Finances? N N  Housekeeping or managing your Housekeeping? N N    Patient Care Team: [name], [place] as PCP - General (Physician Assistant) Thornton Park, MD as Consulting Physician (Gastroenterology) Daron Offer, Richard Miu, MD as Consulting Physician (Psychiatry) Edythe Clarity, [facility] as Pharmacist (Pharmacist)  Indicate any recent Medical Services you may have received from other than [facility] providers in the past year (date may be approximate).     Assessment:   This is a routine wellness examination for [place].  Hearing/Vision screen Hearing Screening - Comments:: Pt denies any hearing issues  Vision Screening - Comments:: Pt follows up with Dr Gershon Crane for annul eye exams   Dietary issues and exercise activities discussed: Current Exercise Habits: Home exercise routine, Type of exercise: walking, Time (Minutes): 30, Frequency (Times/Week): 3, Weekly Exercise (Minutes/Week): 90   Goals Addressed             This Visit's Progress    Patient Stated       Lose more weight 50lbs        Depression Screen    09/23/2022    9:27 AM 09/14/2021    8:54 AM 09/05/2020    9:02 AM 07/30/2019   12:39 PM 07/06/2018    9:56 AM 04/10/2018    1:48 PM 03/31/2017   10:10 AM  PHQ 2/9 Scores  PHQ - 2 Score '1 2 1 '$ 0 '1 3 3  '$ PHQ- 9 Score  '4    9 10    '$ Fall Risk    09/23/2022    9:30 AM 09/19/2022    9:48 AM 09/14/2021  8:58 AM 09/05/2020    9:05 AM 07/30/2019   12:39 PM  Fall Risk   Falls in the past year? 0 0 0 0 0  Number falls in past yr: 0 0 0 0   Injury with Fall? 0 0 0 0 0  Risk for fall due to : Impaired vision  Impaired vision Impaired vision   Follow up Falls prevention discussed  Falls prevention discussed Falls prevention discussed Falls evaluation completed;Education  provided;Falls prevention discussed    FALL RISK PREVENTION PERTAINING TO THE HOME:  Any stairs in or around the home? No  If so, are there any without handrails? No  Home free of loose throw rugs in walkways, pet beds, electrical cords, etc? Yes  Adequate lighting in your home to reduce risk of falls? Yes   ASSISTIVE DEVICES UTILIZED TO PREVENT FALLS:  Life alert? No  Use of a cane, walker or w/c? No  Grab bars in the bathroom? No  Shower chair or bench in shower? No  Elevated toilet seat or a handicapped toilet? No   TIMED UP AND GO:  Was the test performed? No .   Cognitive Function:        09/23/2022    9:30 AM 09/14/2021    9:00 AM 09/05/2020    9:08 AM  6CIT Screen  What Year? 0 points 0 points 0 points  What month? 0 points 0 points 0 points  What time? 0 points 0 points   Count back from 20 0 points 0 points 0 points  Months in reverse 0 points 0 points 0 points  Repeat phrase 2 points 0 points 0 points  Total Score 2 points 0 points     Immunizations Immunization History  Administered Date(s) Administered   PFIZER(Purple Top)SARS-COV-2 Vaccination 01/05/2020, 01/29/2020, 08/11/2020   Tdap 05/12/2018    TDAP status: Up to date  Flu Vaccine status: Declined, Education has been provided regarding the importance of this vaccine but patient still declined. Advised may receive this vaccine at local pharmacy or Health Dept. Aware to provide a copy of the vaccination record if obtained from local pharmacy or Health Dept. Verbalized acceptance and understanding.    Covid-19 vaccine status: Completed vaccines  Qualifies for Shingles Vaccine? Yes   Zostavax completed No   Shingrix Completed?: No.    Education has been provided regarding the importance of this vaccine. Patient has been advised to call insurance company to determine out of pocket expense if they have not yet received this vaccine. Advised may also receive vaccine at local pharmacy or Health Dept.  Verbalized acceptance and understanding.  Screening Tests Health Maintenance  Topic Date Due   Hepatitis C Screening  Never done   Zoster Vaccines- Shingrix (1 of 2) Never done   MAMMOGRAM  10/09/2021   COLONOSCOPY (Pts 45-96yr Insurance coverage will need to be confirmed)  12/03/2021   COVID-19 Vaccine (4 - 2023-24 season) 06/04/2022   PAP SMEAR-Modifier  07/25/2023   Medicare Annual Wellness (AWV)  09/24/2023   DTaP/Tdap/Td (2 - Td or Tdap) 05/12/2028   HIV Screening  Completed   HPV VACCINES  Aged Out   INFLUENZA VACCINE  Discontinued    Health Maintenance  Health Maintenance Due  Topic Date Due   Hepatitis C Screening  Never done   Zoster Vaccines- Shingrix (1 of 2) Never done   MAMMOGRAM  10/09/2021   COLONOSCOPY (Pts 45-423yrInsurance coverage will need to be confirmed)  12/03/2021   COVID-19 Vaccine (4 -  2023-24 season) 06/04/2022    Colorectal cancer screening: Referral to GI placed 09/23/22. Pt aware the office will call re: appt.  Mammogram status: Ordered 09/23/22. Pt provided with contact info and advised to call to schedule appt.    Additional Screening:  Hepatitis C Screening: does qualify;  Vision Screening: Recommended annual ophthalmology exams for early detection of glaucoma and other disorders of the eye. Is the patient up to date with their annual eye exam?  Yes  Who is the provider or what is the name of the office in which the patient attends annual eye exams? Dr Gershon Crane  If pt is not established with a provider, would they like to be referred to a provider to establish care? .   Dental Screening: Recommended annual dental exams for proper oral hygiene  Community Resource Referral / Chronic Care Management: CRR required this visit?  No   CCM required this visit?  No      Plan:     I have personally reviewed and noted the following in the patient's chart:   Medical and social history Use of alcohol, tobacco or illicit drugs  Current  medications and supplements including opioid prescriptions. Patient is not currently taking opioid prescriptions. Functional ability and status Nutritional status Physical activity Advanced directives List of other physicians Hospitalizations, surgeries, and ER visits in previous 12 months Vitals Screenings to include cognitive, depression, and falls Referrals and appointments  In addition, I have reviewed and discussed with patient certain preventive protocols, quality metrics, and best practice recommendations. A written personalized care plan for preventive services as well as general preventive health recommendations were provided to patient.     Willette Brace, LPN   89/21/1939   Nurse Notes: none

## 2022-09-28 NOTE — Telephone Encounter (Signed)
Colon scheduled with patient for 10/23/22 at 2:30 pm with Dr. Tarri Glenn & PV (telephone) scheduled for 10/19/22 at 10:00 am. No blood thinners, only on metformin for diabetes.

## 2022-09-29 DIAGNOSIS — F3132 Bipolar disorder, current episode depressed, moderate: Secondary | ICD-10-CM | POA: Diagnosis not present

## 2022-10-05 ENCOUNTER — Ambulatory Visit
Admission: RE | Admit: 2022-10-05 | Discharge: 2022-10-05 | Disposition: A | Discharge status: Home / Self Care | Payer: PPO | Source: Ambulatory Visit | Attending: Physician Assistant | Admitting: Physician Assistant

## 2022-10-05 DIAGNOSIS — Z1231 Encounter for screening mammogram for malignant neoplasm of breast: Secondary | ICD-10-CM

## 2022-10-19 ENCOUNTER — Ambulatory Visit (AMBULATORY_SURGERY_CENTER): Payer: PPO

## 2022-10-19 VITALS — Ht 60.0 in | Wt 247.0 lb

## 2022-10-19 DIAGNOSIS — Z86010 Personal history of colonic polyps: Secondary | ICD-10-CM

## 2022-10-19 MED ORDER — NA SULFATE-K SULFATE-MG SULF 17.5-3.13-1.6 GM/177ML PO SOLN: 1.0000 | Freq: Once | ORAL | 0 refills | Status: AC

## 2022-10-19 NOTE — Progress Notes (Signed)

## 2022-10-27 ENCOUNTER — Encounter: Payer: Self-pay | Admitting: Gastroenterology

## 2022-10-27 DIAGNOSIS — F3132 Bipolar disorder, current episode depressed, moderate: Secondary | ICD-10-CM | POA: Diagnosis not present

## 2022-11-02 ENCOUNTER — Ambulatory Visit (AMBULATORY_SURGERY_CENTER): Payer: PPO | Admitting: Gastroenterology

## 2022-11-02 ENCOUNTER — Encounter: Payer: Self-pay | Admitting: Gastroenterology

## 2022-11-02 VITALS — BP 99/66 | HR 81 | Temp 97.7°F | Resp 13 | Ht 60.0 in | Wt 246.6 lb

## 2022-11-02 DIAGNOSIS — Z09 Encounter for follow-up examination after completed treatment for conditions other than malignant neoplasm: Secondary | ICD-10-CM | POA: Diagnosis not present

## 2022-11-02 DIAGNOSIS — G4730 Sleep apnea, unspecified: Secondary | ICD-10-CM | POA: Diagnosis not present

## 2022-11-02 DIAGNOSIS — D122 Benign neoplasm of ascending colon: Secondary | ICD-10-CM

## 2022-11-02 DIAGNOSIS — F419 Anxiety disorder, unspecified: Secondary | ICD-10-CM | POA: Diagnosis not present

## 2022-11-02 DIAGNOSIS — E669 Obesity, unspecified: Secondary | ICD-10-CM | POA: Diagnosis not present

## 2022-11-02 DIAGNOSIS — E119 Type 2 diabetes mellitus without complications: Secondary | ICD-10-CM | POA: Diagnosis not present

## 2022-11-02 DIAGNOSIS — F319 Bipolar disorder, unspecified: Secondary | ICD-10-CM | POA: Diagnosis not present

## 2022-11-02 DIAGNOSIS — Z86010 Personal history of colonic polyps: Secondary | ICD-10-CM

## 2022-11-02 MED ORDER — SODIUM CHLORIDE 0.9 % IV SOLN: 500.0000 mL | INTRAVENOUS | Status: DC

## 2022-11-02 NOTE — Patient Instructions (Signed)
Handouts provided on polyps and diverticulosis.   Follow a high fiber diet (see handout). Drink at least 64 ounces of water daily. Add a daily stool bulking agent such as psyllium (an example would be Metamucil).  Continue present medications. Await pathology results.  Repeat colonoscopy in 5 years for surveillance, earlier with new symptoms.    YOU HAD AN ENDOSCOPIC PROCEDURE TODAY AT [facility]:   Refer to the procedure report that was given to you for any specific questions about what was found during the examination.  If the procedure report does not answer your questions, please call your gastroenterologist to clarify.  If you requested that your care partner not be given the details of your procedure findings, then the procedure report has been included in a sealed envelope for you to review at your convenience later.  YOU SHOULD EXPECT: Some feelings of bloating in the abdomen. Passage of more gas than usual.  Walking can help get rid of the air that was put into your GI tract during the procedure and reduce the bloating. If you had a lower endoscopy (such as a colonoscopy or flexible sigmoidoscopy) you may notice spotting of blood in your stool or on the toilet paper. If you underwent a bowel prep for your procedure, you may not have a normal bowel movement for a few days.  Please Note:  You might notice some irritation and congestion in your nose or some drainage.  This is from the oxygen used during your procedure.  There is no need for concern and it should clear up in a day or so.  SYMPTOMS TO REPORT IMMEDIATELY:  Following lower endoscopy (colonoscopy or flexible sigmoidoscopy):  Excessive amounts of blood in the stool  Significant tenderness or worsening of abdominal pains  Swelling of the abdomen that is new, acute  Fever of 100F or higher  For urgent or emergent issues, a gastroenterologist can be reached at any hour by calling [phone]. Do not use  MyChart messaging for urgent concerns.    DIET:  We do recommend a small meal at first, but then you may proceed to your regular diet.  Drink plenty of fluids but you should avoid alcoholic beverages for 24 hours.  ACTIVITY:  You should plan to take it easy for the rest of today and you should NOT DRIVE or use heavy machinery until tomorrow (because of the sedation medicines used during the test).    FOLLOW UP: Our staff will call the number listed on your records the next business day following your procedure.  We will call around 7:15- 8:00 am to check on you and address any questions or concerns that you may have regarding the information given to you following your procedure. If we do not reach you, we will leave a message.     If any biopsies were taken you will be contacted by phone or by letter within the next 1-3 weeks.  Please call us at [phone] if you have not heard about the biopsies in 3 weeks.    SIGNATURES/CONFIDENTIALITY: You and/or your care partner have signed paperwork which will be entered into your electronic medical record.  These signatures attest to the fact that that the information above on your After Visit Summary has been reviewed and is understood.  Full responsibility of the confidentiality of this discharge information lies with you and/or your care-partner.

## 2022-11-02 NOTE — Progress Notes (Signed)
Referring Provider: [name], PA Primary Care Physician:  [name], PA  Indication for Procedure:  Colon cancer Surveillance   IMPRESSION:  Need for colon cancer surveillance Appropriate candidate for monitored anesthesia care  PLAN: Colonoscopy in the [place] today   HPI: [name] is a 57 y.o. female presents for surveillance colonoscopy.  Prior endoscopic history: Colonoscopy 12/04/2018: left-sided diverticulosis, 3 tubular adenomas   Past Medical History:  Diagnosis Date   Anemia    Anxiety    Asthma    Bipolar 1 disorder ([place])    Depression    Diabetes mellitus without complication ([place])    Dyspnea 2020   with exersion   GERD (gastroesophageal reflux disease)    Glaucoma    Heart murmur    HISTORY OF CLOSED IN 2006   History of chicken pox    History of Helicobacter pylori infection    History of migraine    Hypertension    Obesity    PFO (patent foramen ovale)    Renal disorder    Sleep apnea    does not use cpap   Vitamin D deficiency     Past Surgical History:  Procedure Laterality Date   BIOPSY  12/04/2018   Procedure: BIOPSY;  Surgeon: Thornton Park, MD;  [facility];  Service: Gastroenterology;;  EGD and Colon   BREAST REDUCTION SURGERY  1986   CHOLECYSTECTOMY N/A 03/23/2019   Procedure: LAPAROSCOPIC CHOLECYSTECTOMY;  Surgeon: Clovis Riley, MD;  [facility]: [facility];  Service: General;  Laterality: N/A;   COLONOSCOPY WITH PROPOFOL N/A 12/04/2018   Procedure: COLONOSCOPY WITH PROPOFOL;  Surgeon: Thornton Park, MD;  [facility];  Service: Gastroenterology;  Laterality: N/A;   ESOPHAGOGASTRODUODENOSCOPY (EGD) WITH PROPOFOL N/A 12/04/2018   Procedure: ESOPHAGOGASTRODUODENOSCOPY (EGD) WITH PROPOFOL;  Surgeon: Thornton Park, MD;  [facility];  Service: Gastroenterology;  Laterality: N/A;   PATENT FORAMEN OVALE(PFO) CLOSURE  2006   REDUCTION MAMMAPLASTY     TONSILLECTOMY     WISDOM TOOTH  EXTRACTION      Current Outpatient Medications  Medication Sig Dispense Refill   ARIPiprazole (ABILIFY) 10 MG tablet Take 1 tablet (10 mg total) by mouth daily. 90 tablet 0   cholecalciferol (VITAMIN D3) 25 MCG (1000 UNIT) tablet Take 5,000 Units by mouth daily.     ferrous sulfate 325 (65 FE) MG tablet Take 325 mg by mouth daily with breakfast.     hydrOXYzine (ATARAX) 25 MG tablet Take 25 mg by mouth 3 (three) times daily.     latanoprost (XALATAN) 0.005 % ophthalmic solution INSTILL 1 DROP INTO BOTH EYES NIGHTLY     lisinopril-hydrochlorothiazide (ZESTORETIC) 20-25 MG tablet TAKE ONE TABLET BY MOUTH DAILY 90 tablet 3   metFORMIN (GLUCOPHAGE) 500 MG tablet TAKE 1 TABLET BY MOUTH TWICE A DAY WITH MEALS 180 tablet 0   rosuvastatin (CRESTOR) 5 MG tablet TAKE 1 TABLET BY MOUTH 3 TIMES A WEEK 36 tablet 3   traZODone (DESYREL) 100 MG tablet Take 1 tablet (100 mg total) by mouth at bedtime. 90 tablet 0   vitamin C (ASCORBIC ACID) 500 MG tablet Take 500 mg by mouth daily.     albuterol (VENTOLIN HFA) 108 (90 Base) MCG/ACT inhaler Inhale 1-2 puffs into the lungs every 6 (six) hours as needed for wheezing or shortness of breath. 18 g 2   Current Facility-Administered Medications  Medication Dose Route Frequency Provider Last Rate Last Admin   0.9 %  sodium chloride infusion  500  mL Intravenous Continuous Thornton Park, MD        Allergies as of 11/02/2022   (No Known Allergies)    Family History  Problem Relation Age of Onset   Diabetes Mother    Hypertension Mother    Stroke Father    Breast cancer Maternal Aunt    Diabetes Maternal Grandmother    CVA Maternal Grandmother    Diabetes Maternal Grandfather    Colon cancer Paternal Grandmother    Diabetes Paternal Grandfather    Asthma Other    Cancer Other    COPD Other    Esophageal cancer Neg Hx    Rectal cancer Neg Hx    Stomach cancer Neg Hx    Liver disease Neg Hx    Colon polyps Neg Hx      Physical Exam: General:    Alert,  well-nourished, pleasant and cooperative in NAD Head:  Normocephalic and atraumatic. Eyes:  Sclera clear, no icterus.   Conjunctiva pink. Mouth:  No deformity or lesions.   Neck:  Supple; no masses or thyromegaly. Lungs:  Clear throughout to auscultation.   No wheezes. Heart:  Regular rate and rhythm; no murmurs. Abdomen:  Soft, non-tender, nondistended, normal bowel sounds, no rebound or guarding.  Msk:  Symmetrical. No boney deformities LAD: No inguinal or umbilical LAD Extremities:  No clubbing or edema. Neurologic:  Alert and  oriented x4;  grossly nonfocal Skin:  No obvious rash or bruise. Psych:  Alert and cooperative. Normal mood and affect.     Studies/Results: No results found.    Laken Rog L. Tarri Glenn, MD, MPH 11/02/2022, 9:21 AM

## 2022-11-02 NOTE — Progress Notes (Signed)
Report to PACU RN, VSS, care resumed by RN

## 2022-11-02 NOTE — Progress Notes (Signed)
Pt's states no medical or surgical changes since previsit or office visit. 

## 2022-11-02 NOTE — Op Note (Signed)
[facility] Patient Name: [name] Procedure Date: 11/02/2022 9:21 AM MRN: [id number] Endoscopist: Thornton Park MD, MD, [id number] Age: 57 Referring MD:  Date of Birth: 03/28/1966 Gender: Female Account #: [id number] Procedure:                Colonoscopy Indications:              High risk colon cancer surveillance: Personal                            history of multiple (3 or more) adenomas                           Three tubular adenomas removed in 2020 Medicines:                Monitored Anesthesia Care Procedure:                Pre-Anesthesia Assessment:                           - Prior to the procedure, a History and Physical                            was performed, and patient medications and                            allergies were reviewed. The patient's tolerance of                            previous anesthesia was also reviewed. The risks                            and benefits of the procedure and the sedation                            options and risks were discussed with the patient.                            All questions were answered, and informed consent                            was obtained. Prior Anticoagulants: The patient has                            taken no anticoagulant or antiplatelet agents. ASA                            Grade Assessment: III - A patient with severe                            systemic disease. After reviewing the risks and                            benefits, the patient was deemed in satisfactory  condition to undergo the procedure.                           After obtaining informed consent, the colonoscope                            was passed under direct vision. Throughout the                            procedure, the patient's blood pressure, pulse, and                            oxygen saturations were monitored continuously. The                            CF [id number] #[id number] was  introduced through the anus                            and advanced to the 3 cm into the ileum. A second                            forward view of the right colon was performed. The                            colonoscopy was performed without difficulty. The                            patient tolerated the procedure well. The quality                            of the bowel preparation was good. The terminal                            ileum, ileocecal valve, appendiceal orifice, and                            rectum were photographed. Scope In: 9:31:02 AM Scope Out: 9:48:17 AM Scope Withdrawal Time: 0 hours 8 minutes 56 seconds  Total Procedure Duration: 0 hours 17 minutes 15 seconds  Findings:                 The perianal and digital rectal examinations were                            normal.                           Multiple medium-mouthed and small-mouthed                            diverticula were found in the sigmoid colon,                            descending colon, transverse colon and ascending  colon.                           A 6 mm polyp was found in the ascending colon. The                            polyp was sessile. The polyp was removed with a                            cold snare. Resection and retrieval were complete.                            Estimated blood loss was minimal.                           The exam was otherwise without abnormality on                            direct and retroflexion views. Complications:            No immediate complications. Estimated Blood Loss:     Estimated blood loss was minimal. Impression:               - Diverticulosis in the sigmoid colon, in the                            descending colon, in the transverse colon and in                            the ascending colon.                           - One 6 mm polyp in the ascending colon, removed                            with a cold snare. Resected and  retrieved.                           - The examination was otherwise normal on direct                            and retroflexion views. Recommendation:           - Patient has a contact number available for                            emergencies. The signs and symptoms of potential                            delayed complications were discussed with the                            patient. Return to normal activities tomorrow.  Written discharge instructions were provided to the                            patient.                           - High fiber diet.                           - Continue present medications.                           - Await pathology results.                           - Repeat colonoscopy in 5 years for surveillance,                            earlier with new symptoms.                           - Follow a high fiber diet. Drink at least 64                            ounces of water daily. Add a daily stool bulking                            agent such as psyllium (an exampled would be                            Metamucil).                           - Emerging evidence supports eating a diet of                            fruits, vegetables, grains, calcium, and yogurt                            while reducing red meat and alcohol may reduce the                            risk of colon cancer.                           - Thank you for allowing me to be involved in your                            colon cancer prevention. Thornton Park MD, MD 11/02/2022 9:55:51 AM This report has been signed electronically.

## 2022-11-02 NOTE — Progress Notes (Signed)
Called to room to assist during endoscopic procedure.  Patient ID and intended procedure confirmed with present staff. Received instructions for my participation in the procedure from the performing physician.  

## 2022-11-03 ENCOUNTER — Telehealth: Payer: Self-pay

## 2022-11-03 NOTE — Telephone Encounter (Signed)
  Follow up Call-     11/02/2022    8:43 AM  Call back number  Post procedure Call Back phone  # [phone]  Permission to leave phone message Yes     Patient questions:  Do you have a fever, pain , or abdominal swelling? No. Pain Score  0 *  Have you tolerated food without any problems? Yes.    Have you been able to return to your normal activities? Yes.    Do you have any questions about your discharge instructions: Diet   No. Medications  No. Follow up visit  No.  Do you have questions or concerns about your Care? No.  Actions: * If pain score is 4 or above: No action needed, pain <4.

## 2022-11-04 ENCOUNTER — Encounter: Payer: Self-pay | Admitting: Gastroenterology

## 2022-11-25 ENCOUNTER — Emergency Department (HOSPITAL_BASED_OUTPATIENT_CLINIC_OR_DEPARTMENT_OTHER)
Admission: EM | Admit: 2022-11-25 | Discharge: 2022-11-25 | Disposition: A | Discharge status: Home / Self Care | Payer: PPO | Attending: Emergency Medicine | Admitting: Emergency Medicine

## 2022-11-25 ENCOUNTER — Emergency Department (HOSPITAL_BASED_OUTPATIENT_CLINIC_OR_DEPARTMENT_OTHER): Payer: PPO

## 2022-11-25 ENCOUNTER — Telehealth: Payer: Self-pay | Admitting: Physician Assistant

## 2022-11-25 ENCOUNTER — Other Ambulatory Visit: Payer: Self-pay

## 2022-11-25 ENCOUNTER — Encounter (HOSPITAL_BASED_OUTPATIENT_CLINIC_OR_DEPARTMENT_OTHER): Payer: Self-pay

## 2022-11-25 DIAGNOSIS — J45909 Unspecified asthma, uncomplicated: Secondary | ICD-10-CM | POA: Insufficient documentation

## 2022-11-25 DIAGNOSIS — I10 Essential (primary) hypertension: Secondary | ICD-10-CM | POA: Diagnosis not present

## 2022-11-25 DIAGNOSIS — Z7951 Long term (current) use of inhaled steroids: Secondary | ICD-10-CM | POA: Insufficient documentation

## 2022-11-25 DIAGNOSIS — R109 Unspecified abdominal pain: Secondary | ICD-10-CM | POA: Diagnosis not present

## 2022-11-25 DIAGNOSIS — Z7984 Long term (current) use of oral hypoglycemic drugs: Secondary | ICD-10-CM | POA: Insufficient documentation

## 2022-11-25 DIAGNOSIS — E119 Type 2 diabetes mellitus without complications: Secondary | ICD-10-CM | POA: Diagnosis not present

## 2022-11-25 DIAGNOSIS — Z79899 Other long term (current) drug therapy: Secondary | ICD-10-CM | POA: Insufficient documentation

## 2022-11-25 DIAGNOSIS — R1011 Right upper quadrant pain: Secondary | ICD-10-CM | POA: Insufficient documentation

## 2022-11-25 LAB — COMPREHENSIVE METABOLIC PANEL
ALT: 9 U/L (ref 0–44)
AST: 11 U/L — ABNORMAL LOW (ref 15–41)
Albumin: 4.1 g/dL (ref 3.5–5.0)
Alkaline Phosphatase: 66 U/L (ref 38–126)
Anion gap: 13 (ref 5–15)
BUN: 19 mg/dL (ref 6–20)
CO2: 26 mmol/L (ref 22–32)
Calcium: 9.4 mg/dL (ref 8.9–10.3)
Chloride: 102 mmol/L (ref 98–111)
Creatinine, Ser: 0.85 mg/dL (ref 0.44–1.00)
GFR, Estimated: >60 mL/min (ref >60)
Glucose, Bld: 106 mg/dL — ABNORMAL HIGH (ref 70–99)
Potassium: 3.7 mmol/L (ref 3.5–5.1)
Sodium: 141 mmol/L (ref 135–145)
Total Bilirubin: 0.7 mg/dL (ref 0.3–1.2)
Total Protein: 8 g/dL (ref 6.5–8.1)

## 2022-11-25 LAB — CBC
HCT: 32.7 % — ABNORMAL LOW (ref 36.0–46.0)
Hemoglobin: 10.2 g/dL — ABNORMAL LOW (ref 12.0–15.0)
MCH: 26.6 pg (ref 26.0–34.0)
MCHC: 31.2 g/dL (ref 30.0–36.0)
MCV: 85.2 fL (ref 80.0–100.0)
Platelets: 314 10*3/uL (ref 150–400)
RBC: 3.84 MIL/uL — ABNORMAL LOW (ref 3.87–5.11)
RDW: 15.4 % (ref 11.5–15.5)
WBC: 8.4 10*3/uL (ref 4.0–10.5)
nRBC: 0 % (ref 0.0–0.2)

## 2022-11-25 LAB — URINALYSIS, ROUTINE W REFLEX MICROSCOPIC
Bilirubin Urine: NEGATIVE
Glucose, UA: NEGATIVE mg/dL
Hgb urine dipstick: NEGATIVE
Leukocytes,Ua: NEGATIVE
Nitrite: NEGATIVE
Specific Gravity, Urine: 1.032 — ABNORMAL HIGH (ref 1.005–1.030)
pH: 5.5 (ref 5.0–8.0)

## 2022-11-25 LAB — LIPASE, BLOOD: Lipase: 18 U/L (ref 11–51)

## 2022-11-25 MED ORDER — IOHEXOL 300 MG/ML  SOLN
100.0000 mL | Freq: Once | INTRAMUSCULAR | Status: AC | PRN
  Administered 2022-11-25: 100 mL via INTRAVENOUS

## 2022-11-25 NOTE — Telephone Encounter (Signed)
Patient states she has severe pain (level 9) on her right side, especially when moving or breathing inward.   Transferred to Triage Cristie Hem)

## 2022-11-25 NOTE — ED Provider Notes (Signed)
[facility] Provider Note   CSN: [id number] Arrival date & time: 11/25/22  1612     History  Chief Complaint  Patient presents with   Flank Pain    [name] is a 57 y.o. female with history of HTN, bipolar 1 disorder, sleep apnea, asthma, anxiety, depression, diabetes, GERD, migraines who presents to the ER who complains of intermittent right sided flank pain x 1 month. Woke up this morning with severe right sided abdominal pain, worse with moving and taking deep breaths. Took some ibuprofen without relief. Called her doctor's office and they recommended she come to the ER for evaluation. Has follow up appointment scheduled for tomorrow morning. Says her pain is mostly resolved if she lays still. No associated symptoms. Hx of cholecystectomy, no other abdominal surgeries.    Flank Pain Associated symptoms include abdominal pain. Pertinent negatives include no chest pain.       Home Medications Prior to Admission medications   Medication Sig Start Date End Date Taking? Authorizing Provider  albuterol (VENTOLIN HFA) 108 (90 Base) MCG/ACT inhaler Inhale 1-2 puffs into the lungs every 6 (six) hours as needed for wheezing or shortness of breath. 02/02/21   [name], PA  ARIPiprazole (ABILIFY) 10 MG tablet Take 1 tablet (10 mg total) by mouth daily. 09/02/21   [name], PA  cholecalciferol (VITAMIN D3) 25 MCG (1000 UNIT) tablet Take 5,000 Units by mouth daily.    [provider]  ferrous sulfate 325 (65 FE) MG tablet Take 325 mg by mouth daily with breakfast.    [provider]  hydrOXYzine (ATARAX) 25 MG tablet Take 25 mg by mouth 3 (three) times daily.    [provider]  latanoprost (XALATAN) 0.005 % ophthalmic solution INSTILL 1 DROP INTO BOTH EYES NIGHTLY 07/19/22   [provider]  lisinopril-hydrochlorothiazide (ZESTORETIC) 20-25 MG tablet TAKE ONE TABLET BY MOUTH DAILY 03/19/22    [name], PA  metFORMIN (GLUCOPHAGE) 500 MG tablet TAKE 1 TABLET BY MOUTH TWICE A DAY WITH MEALS 09/08/22   [name], PA  rosuvastatin (CRESTOR) 5 MG tablet TAKE 1 TABLET BY MOUTH 3 TIMES A WEEK 03/25/22   [name], PA  traZODone (DESYREL) 100 MG tablet Take 1 tablet (100 mg total) by mouth at bedtime. 09/02/21   [name], PA  vitamin C (ASCORBIC ACID) 500 MG tablet Take 500 mg by mouth daily.    [provider]      Allergies    Patient has no known allergies.    Review of Systems   Review of Systems  Constitutional:  Negative for chills and fever.  Cardiovascular:  Negative for chest pain.  Gastrointestinal:  Positive for abdominal pain. Negative for diarrhea, nausea and vomiting.  Genitourinary:  Positive for flank pain. Negative for dysuria, hematuria, vaginal bleeding and vaginal discharge.  All other systems reviewed and are negative.   Physical Exam Updated Vital Signs BP (!) 148/84 (BP Location: Right Wrist)   Pulse (!) 114   Temp 99.5 F (37.5 C) (Oral)   Resp 18   Ht 5' (1.524 m)   Wt 112 kg   LMP 07/10/2020   SpO2 99%   BMI 48.24 kg/m  Physical Exam Vitals and nursing note reviewed.  Constitutional:      Appearance: Normal appearance.  HENT:     Head: Normocephalic and atraumatic.  Eyes:     Conjunctiva/sclera: Conjunctivae normal.  Cardiovascular:     Rate and  Rhythm: Normal rate and regular rhythm.  Pulmonary:     Effort: Pulmonary effort is normal. No respiratory distress.     Breath sounds: Normal breath sounds.  Abdominal:     General: There is no distension.     Palpations: Abdomen is soft.     Tenderness: There is abdominal tenderness in the right upper quadrant. There is no right CVA tenderness or guarding.     Comments: Right upper quadrant and right lateral side pain, no CVA tenderness  Skin:    General: Skin is warm and dry.  Neurological:     General: No focal deficit present.     Mental Status: She is  alert.     ED Results / Procedures / Treatments   Labs (all labs ordered are listed, but only abnormal results are displayed) Labs Reviewed  COMPREHENSIVE METABOLIC PANEL - Abnormal; Notable for the following components:      Result Value   Glucose, Bld 106 (*)    AST 11 (*)    All other components within normal limits  CBC - Abnormal; Notable for the following components:   RBC 3.84 (*)    Hemoglobin 10.2 (*)    HCT 32.7 (*)    All other components within normal limits  URINALYSIS, ROUTINE W REFLEX MICROSCOPIC - Abnormal; Notable for the following components:   Specific Gravity, Urine 1.032 (*)    Ketones, ur TRACE (*)    Protein, ur TRACE (*)    All other components within normal limits  LIPASE, BLOOD    EKG None  Radiology CT ABDOMEN PELVIS W CONTRAST  Result Date: 11/25/2022 CLINICAL DATA:  Acute nonlocalized abdominal pain. Right flank pain off and on for a month. EXAM: CT ABDOMEN AND PELVIS WITH CONTRAST TECHNIQUE: Multidetector CT imaging of the abdomen and pelvis was performed using the standard protocol following bolus administration of intravenous contrast. RADIATION DOSE REDUCTION: This exam was performed according to the departmental dose-optimization program which includes automated exposure control, adjustment of the mA and/or kV according to patient size and/or use of iterative reconstruction technique. CONTRAST:  139m OMNIPAQUE IOHEXOL 300 MG/ML  SOLN COMPARISON:  None Available. FINDINGS: Lower chest: Lung bases are clear. Hepatobiliary: No focal liver abnormality is seen. Status post cholecystectomy. No biliary dilatation. Pancreas: Unremarkable. No pancreatic ductal dilatation or surrounding inflammatory changes. Spleen: Normal in size without focal abnormality. Adrenals/Urinary Tract: Adrenal glands are unremarkable. Kidneys are normal, without renal calculi, focal lesion, or hydronephrosis. Bladder is unremarkable. Stomach/Bowel: Stomach is within normal limits.  Appendix appears normal. No evidence of bowel wall thickening, distention, or inflammatory changes. Vascular/Lymphatic: No significant vascular findings are present. No enlarged abdominal or pelvic lymph nodes. Reproductive: Uterus and bilateral adnexa are unremarkable. Other: No abdominal wall hernia or abnormality. No abdominopelvic ascites. Musculoskeletal: Degenerative changes in the spine. IMPRESSION: No acute process demonstrated in the abdomen or pelvis. No evidence of bowel obstruction or inflammation. Appendix is normal. Electronically Signed   By: WLucienne CapersM.D.   On: 11/25/2022 18:42    Procedures Procedures    Medications Ordered in ED Medications  iohexol (OMNIPAQUE) 300 MG/ML solution 100 mL (100 mLs Intravenous Contrast Given 11/25/22 1816)    ED Course/ Medical Decision Making/ A&P                             Medical Decision Making Amount and/or Complexity of Data Reviewed Labs: ordered.   This patient is a  57 y.o. female  who presents to the ED for concern of right sided abdominal and flank pain. Hx of cholecystectomy.   Differential diagnoses prior to evaluation: The emergent differential diagnosis includes, but is not limited to,  appendicitis, diverticulitis, gastroenteritis, nephrolithiasis, pancreatitis, constipation, UTI, bowel obstruction, biliary disease, IBD, PUD, hepatitis, ovarian torsion, PID, pneumonia. This is not an exhaustive differential.   Past Medical History / Co-morbidities:  HTN, bipolar 1 disorder, sleep apnea, asthma, anxiety, depression, diabetes, GERD, migraines  Additional history: Chart reviewed. Pertinent results include: Phone encounter from PCP office viewed from this morning in which patient was complaining of right-sided abdominal pain, severely worsening this morning.  They discussed with physician who recommended she go to the ER for evaluation.   Physical Exam: Physical exam performed. The pertinent findings include:  Initially tachycardic, improved after laying still for some time. Mildly hypertensive, otherwise normal vital signs. RUQ mild tenderness and right lateral trunk tenderness without guarding or rebound. No CVA tenderness.  Lab Tests/Imaging studies: I personally interpreted labs/imaging and the pertinent results include:  No leukocytosis, stable hemoglobin compared to prior. CMP unremarkable, normal kidney and liver function.Normal lipase.    CT abdomen pelvis without acute findings. I agree with the radiologist interpretation.  Disposition: After consideration of the diagnostic results and the patients response to treatment, I feel that emergency department workup does not suggest an emergent condition requiring admission or immediate intervention beyond what has been performed at this time. The plan is: discharge to home with reassurance regarding today's workup. Could be related to musculoskeletal pain, recommend anti inflammatories, heating pad, and PCP follow up. The patient is safe for discharge and has been instructed to return immediately for worsening symptoms, change in symptoms or any other concerns.  Final Clinical Impression(s) / ED Diagnoses Final diagnoses:  Right lateral abdominal pain    Rx / DC Orders ED Discharge Orders     None      Portions of this report may have been transcribed using voice recognition software. Every effort was made to ensure accuracy; however, inadvertent computerized transcription errors may be present.    Estill Cotta 11/25/22 Otis Peak, MD 11/25/22 [phone]

## 2022-11-25 NOTE — ED Triage Notes (Signed)
Pt to er room number 37, pt states that she has been having some R flank pain off and on for the past month.  Pt states that movement makes the pain worse, states that deep breathing makes the pain worse.  Pt denies sob.

## 2022-11-25 NOTE — Telephone Encounter (Signed)
Spoke to pt c/o pain right side of abdomen x 1 month, woke up today with severe pain on right side when moving and taking a deep breath. Pt said she took Ibuprofen but no relief. Told pt discussed with Dr. Jerline Pain and he said you should go to the ED to be evaluated. Told her go to ED at [facility], address given to patient. Pt verbalized understanding.

## 2022-11-25 NOTE — Telephone Encounter (Signed)
Patient was cold transferred back to PCP office line. Patient states nurse informed her to be seen within 24 hours. Patient has been scheduled for 11/26/22 @ 11am. Awaiting triage note.

## 2022-11-25 NOTE — Discharge Instructions (Addendum)
You were seen in the ER for side pain.  As we discussed, your blood work and CT imaging was unremarkable today. We did not find an emergent etiology for your symptoms.  I recommend continuing taking some anti-inflammatories (ibuprofen, tylenol, meloxicam) as needed and follow up with your primary doctor so they can discuss if your symptoms are lingering.  You could try using a heating pad as well.   Continue to monitor how you're doing and return to the ER for new or worsening symptoms.

## 2022-11-26 ENCOUNTER — Ambulatory Visit: Payer: PPO | Admitting: Family

## 2022-11-26 NOTE — Telephone Encounter (Signed)
Outcome see pcp within 24hrs/ pt went to ed    Patient Name: [name] Gender: Female DOB: [birth year] Age: 57 Y 10 M 24 D Return Phone Number: [phone] (Primary) Address: City/ State/ Zip: [place]  [address] Client [place] [facility] [place] Client Site [place] [facility] Provider [place], [place] Contact Type Call Who Is Calling Patient / Member / Family / Caregiver Call Type Triage / Clinical Relationship To Patient Self Return Phone Number [phone] (Primary) Chief Complaint SEVERE ABDOMINAL PAIN - Severe pain in abdomen Reason for Call Symptomatic / Request for [place] states she has severe pain on her right side, especially when moving or inhaling. She states it is a 9/10 Translation No Nurse Assessment Nurse: [name], RN, Sherrie Date/Time (Eastern Time): 11/25/2022 2:11:44 PM Confirm and document reason for call. If symptomatic, describe symptoms. ---Caller states having severe pain below belly button on the right side. Hurts worse when moving or taking a deep breath. States pain started about a month ago. States pain is consistent staying there. Does not hurt unless moves. When moves pain is a 9/10. +fluids, +UOP in last 8 hrs. No fever. No vomiting, No diarrhea. Does the patient have any new or worsening symptoms? ---Yes Will a triage be completed? ---Yes Related visit to physician within the last 2 weeks? ---No Does the PT have any chronic conditions? (i.[name]. diabetes, asthma, this includes High risk factors for pregnancy, etc.) ---Yes List chronic conditions. ---Pre Diabetic, HTN Is the patient pregnant or possibly pregnant? (Ask all females between the ages of 12-55) ---No Is this a behavioral health or substance abuse call? ---No PLEASE NOTE: All timestamps contained within this report are represented as [place] Standard Time. CONFIDENTIALTY NOTICE: This  fax transmission is intended only for the addressee. It contains information that is legally privileged, confidential or otherwise protected from use or disclosure. If you are not the intended recipient, you are strictly prohibited from reviewing, disclosing, copying using or disseminating any of this information or taking any action in reliance on or regarding this information. If you have received this fax in error, please notify us immediately by telephone so that we can arrange for its return to [place]. Phone: [phone], Toll-Free: [phone], Fax: [phone] [place]: 2 of 2 Call Id: [id number] Guidelines Guideline Title Affirmed Question Affirmed Notes Nurse Date/Time Eilene Ghazi Time) Abdominal Pain - Female [1] MODERATE pain ([name].g., interferes with normal activities) AND [2] pain comes and goes (cramps) AND [3] present > 24 hours (Exception: Pain with Vomiting or Diarrhea - see that Guideline.) [name], RN, [place] 11/25/2022 2:15:20 PM Disp. Time Eilene Ghazi Time) Disposition Final User 11/25/2022 2:05:54 PM Send to Urgent Queue Susette Racer 11/25/2022 2:18:16 PM See PCP within 24 Hours Yes [name], RN, [place] Final Disposition 11/25/2022 2:18:16 PM See PCP within 24 Hours Yes [name], RN, Sherrie Caller Disagree/Comply Comply Caller Understands Yes PreDisposition Go to ED Care Advice Given Per Guideline SEE PCP WITHIN 24 HOURS: * IF OFFICE WILL BE OPEN: You need to be examined within the next 24 hours. Call your doctor (or NP/PA) when the office opens and make an appointment. * Drink adequate fluids. Eat a bland diet. * Avoid greasy or fatty foods. CALL BACK IF: * Severe pain lasts over 1 hour * Constant pain lasts over 2 hours * You become worse Referrals REFERRED TO PCP OFFIC

## 2022-11-29 NOTE — Telephone Encounter (Signed)
FYI, pt went to ED. See Triage note.

## 2022-11-30 DIAGNOSIS — F3132 Bipolar disorder, current episode depressed, moderate: Secondary | ICD-10-CM | POA: Diagnosis not present

## 2023-01-17 DIAGNOSIS — H401131 Primary open-angle glaucoma, bilateral, mild stage: Secondary | ICD-10-CM | POA: Diagnosis not present

## 2023-01-25 DIAGNOSIS — F3132 Bipolar disorder, current episode depressed, moderate: Secondary | ICD-10-CM | POA: Diagnosis not present

## 2023-03-22 DIAGNOSIS — F3132 Bipolar disorder, current episode depressed, moderate: Secondary | ICD-10-CM | POA: Diagnosis not present

## 2023-04-19 DIAGNOSIS — F3132 Bipolar disorder, current episode depressed, moderate: Secondary | ICD-10-CM | POA: Diagnosis not present

## 2023-05-16 ENCOUNTER — Ambulatory Visit (INDEPENDENT_AMBULATORY_CARE_PROVIDER_SITE_OTHER): Payer: PPO | Admitting: Physician Assistant

## 2023-05-16 ENCOUNTER — Encounter: Payer: Self-pay | Admitting: Physician Assistant

## 2023-05-16 VITALS — BP 130/86 | HR 82 | Temp 97.5°F | Ht 60.0 in | Wt 257.4 lb

## 2023-05-16 DIAGNOSIS — H401131 Primary open-angle glaucoma, bilateral, mild stage: Secondary | ICD-10-CM | POA: Diagnosis not present

## 2023-05-16 DIAGNOSIS — Z0000 Encounter for general adult medical examination without abnormal findings: Secondary | ICD-10-CM | POA: Diagnosis not present

## 2023-05-16 DIAGNOSIS — Z1159 Encounter for screening for other viral diseases: Secondary | ICD-10-CM | POA: Diagnosis not present

## 2023-05-16 DIAGNOSIS — R109 Unspecified abdominal pain: Secondary | ICD-10-CM | POA: Diagnosis not present

## 2023-05-16 DIAGNOSIS — E88819 Insulin resistance, unspecified: Secondary | ICD-10-CM | POA: Diagnosis not present

## 2023-05-16 DIAGNOSIS — I10 Essential (primary) hypertension: Secondary | ICD-10-CM | POA: Diagnosis not present

## 2023-05-16 DIAGNOSIS — E785 Hyperlipidemia, unspecified: Secondary | ICD-10-CM

## 2023-05-16 DIAGNOSIS — F319 Bipolar disorder, unspecified: Secondary | ICD-10-CM | POA: Diagnosis not present

## 2023-05-16 LAB — CBC WITH DIFFERENTIAL/PLATELET
Basophils Absolute: 0 10*3/uL (ref 0.0–0.1)
Basophils Relative: 0.2 % (ref 0.0–3.0)
Eosinophils Absolute: 0 10*3/uL (ref 0.0–0.7)
Eosinophils Relative: 0.8 % (ref 0.0–5.0)
HCT: 31.3 % — ABNORMAL LOW (ref 36.0–46.0)
Hemoglobin: 9.9 g/dL — ABNORMAL LOW (ref 12.0–15.0)
Lymphocytes Relative: 20.6 % (ref 12.0–46.0)
Lymphs Abs: 1.1 10*3/uL (ref 0.7–4.0)
MCHC: 31.7 g/dL (ref 30.0–36.0)
MCV: 83.4 fl (ref 78.0–100.0)
Monocytes Absolute: 0.4 10*3/uL (ref 0.1–1.0)
Monocytes Relative: 7.2 % (ref 3.0–12.0)
Neutro Abs: 3.7 10*3/uL (ref 1.4–7.7)
Neutrophils Relative %: 71.2 % (ref 43.0–77.0)
Platelets: 315 10*3/uL (ref 150.0–400.0)
RBC: 3.75 Mil/uL — ABNORMAL LOW (ref 3.87–5.11)
RDW: 16.5 % — ABNORMAL HIGH (ref 11.5–15.5)
WBC: 5.2 10*3/uL (ref 4.0–10.5)

## 2023-05-16 LAB — HEMOGLOBIN A1C: Hgb A1c MFr Bld: 5.5 % (ref 4.6–6.5)

## 2023-05-16 LAB — COMPREHENSIVE METABOLIC PANEL
ALT: 18 U/L (ref 0–35)
AST: 19 U/L (ref 0–37)
Albumin: 4 g/dL (ref 3.5–5.2)
Alkaline Phosphatase: 65 U/L (ref 39–117)
BUN: 11 mg/dL (ref 6–23)
CO2: 27 mEq/L (ref 19–32)
Calcium: 9.3 mg/dL (ref 8.4–10.5)
Chloride: 101 mEq/L (ref 96–112)
Creatinine, Ser: 0.83 mg/dL (ref 0.40–1.20)
GFR: 79.39 mL/min (ref >60.00)
Glucose, Bld: 92 mg/dL (ref 70–99)
Potassium: 3.8 mEq/L (ref 3.5–5.1)
Sodium: 137 mEq/L (ref 135–145)
Total Bilirubin: 0.6 mg/dL (ref 0.2–1.2)
Total Protein: 7.8 g/dL (ref 6.0–8.3)

## 2023-05-16 LAB — LIPID PANEL
Cholesterol: 137 mg/dL (ref 0–200)
HDL: 44.7 mg/dL (ref >39.00)
LDL Cholesterol: 74 mg/dL (ref 0–99)
NonHDL: 92.56
Total CHOL/HDL Ratio: 3
Triglycerides: 93 mg/dL (ref 0.0–149.0)
VLDL: 18.6 mg/dL (ref 0.0–40.0)

## 2023-05-16 NOTE — Progress Notes (Signed)
Subjective:    [name] is a 57 y.o. female and is here for a comprehensive physical exam.  HPI  Health Maintenance Due  Topic Date Due   Hepatitis C Screening  Never done   Zoster Vaccines- Shingrix (1 of 2) Never done   COVID-19 Vaccine (4 - 2023-24 season) 06/04/2022    Acute Concerns: Pain on the right side She has been experiencing pain on her lower right side that has been persisted for about 8 months.  Her pain tends to wax and wanes and would last for few constant days at time. She occasionally has accompanying back pain and states that her pain is worsened when trying to get up from laying down.   She visited the ED due to her pain. Had negative CT. She also complains of painful intercourse.  She states that she will be following up with her [facility].  She denies any vaginal bleeding or constipation.   Chronic Issues: Obesity Her weight has been fluctuating as she has not been complaint with her exercise and diet.  She is working on restarting her diet and exercise. She continues to take metformin 500 mg daily.   She is not interested in GLP-1  Hyperlipidemia She is complain with Crestor 5 mg thrice weekly but is wondering it has been effecting her levels.   Glaucoma Her glaucoma has been mostly stable.  She would occasionally have dry eyes.   She has a visit with her specialist in October.   Bipolar 1 Disorder Her mood and mental health is currently table.  She is regularly following up with her psychiatrist via tele medicine.   Sleep Apnea She has not been regularly using her CPAP.   Health Maintenance: Immunizations -- n/a Colonoscopy -- Last done on 11-02-22. One polyp and diverticulosis throughout the colon.  Mammogram -- Last done on 10-05-22. Normal PAP -- Last done 10-10-15 Bone Density -- n/a Diet -- typical diet Exercise -- Minimal exercise that includes walking for about 30 mins and chair yoga.   Sleep habits -- no complaints Mood --  stable  UTD with dentist? - yes UTD with eye doctor? - yes  Weight history: Wt Readings from Last 10 Encounters:  11/25/22 247 lb (112 kg)  11/02/22 246 lb 9.6 oz (111.9 kg)  10/19/22 247 lb (112 kg)  09/23/22 246 lb (111.6 kg)  12/18/21 243 lb 3.2 oz (110.3 kg)  02/02/21 236 lb (107 kg)  02/02/21 232 lb (105.2 kg)  03/27/20 243 lb (110.2 kg)  07/30/19 270 lb (122.5 kg)  03/20/19 294 lb (133.4 kg)   There is no height or weight on file to calculate BMI. Patient's last menstrual period was 07/10/2020.  Alcohol use:  reports current alcohol use of about 1.0 - 2.0 standard drink of alcohol per week.  Tobacco use:  Tobacco Use: Low Risk  (01/17/2023)   [facility] History    Smoking Tobacco Use: Never    Smokeless Tobacco Use: Never    Passive Exposure: Not on file   Eligible for lung cancer screening? no     09/23/2022    9:27 AM  Depression screen PHQ 2/9  Decreased Interest 0  Down, Depressed, Hopeless 1  PHQ - 2 Score 1     Other providers/specialists: Patient Care Team: Jarold Motto, [place] as PCP - General (Physician Assistant) Tressia Danas, MD as Consulting Physician (Gastroenterology) Rene Kocher, Bo Mcclintock, MD as Consulting Physician (Psychiatry) Willa Frater  L, RPH (Inactive) as Pharmacist (Pharmacist)    PMHx, SurgHx, SocialHx, Medications, and Allergies were reviewed in the Visit Navigator and updated as appropriate.   Past Medical History:  Diagnosis Date   Anemia    Anxiety    Asthma    Bipolar 1 disorder (HCC)    Depression    Diabetes mellitus without complication (HCC)    Dyspnea 2020   with exersion   GERD (gastroesophageal reflux disease)    Glaucoma    Heart murmur    HISTORY OF CLOSED IN 2006   History of chicken pox    History of Helicobacter pylori infection    History of migraine    Hypertension    Obesity    PFO (patent foramen ovale)    Renal disorder    Sleep apnea    does  not use cpap   Vitamin D deficiency      Past Surgical History:  Procedure Laterality Date   BIOPSY  12/04/2018   Procedure: BIOPSY;  Surgeon: Tressia Danas, MD;  Location: [facility];  Service: Gastroenterology;;  EGD and Colon   BREAST REDUCTION SURGERY  1986   CHOLECYSTECTOMY N/A 03/23/2019   Procedure: LAPAROSCOPIC CHOLECYSTECTOMY;  Surgeon: Berna Bue, MD;  Location: [facility];  Service: General;  Laterality: N/A;   COLONOSCOPY WITH PROPOFOL N/A 12/04/2018   Procedure: COLONOSCOPY WITH PROPOFOL;  Surgeon: Tressia Danas, MD;  Location: [facility];  Service: Gastroenterology;  Laterality: N/A;   ESOPHAGOGASTRODUODENOSCOPY (EGD) WITH PROPOFOL N/A 12/04/2018   Procedure: ESOPHAGOGASTRODUODENOSCOPY (EGD) WITH PROPOFOL;  Surgeon: Tressia Danas, MD;  Location: [facility];  Service: Gastroenterology;  Laterality: N/A;   PATENT FORAMEN OVALE(PFO) CLOSURE  2006   REDUCTION MAMMAPLASTY     TONSILLECTOMY     WISDOM TOOTH EXTRACTION       Family History  Problem Relation Age of Onset   Diabetes Mother    Hypertension Mother    Stroke Father    Breast cancer Maternal Aunt    Diabetes Maternal Grandmother    CVA Maternal Grandmother    Diabetes Maternal Grandfather    Colon cancer Paternal Grandmother    Diabetes Paternal Grandfather    Asthma Other    Cancer Other    COPD Other    Esophageal cancer Neg Hx    Rectal cancer Neg Hx    Stomach cancer Neg Hx    Liver disease Neg Hx    Colon polyps Neg Hx     Social History   Tobacco Use   Smoking status: Never   Smokeless tobacco: Never  Vaping Use   Vaping status: Never Used  Substance Use Topics   Alcohol use: Yes    Alcohol/week: 1.0 - 2.0 standard drink of alcohol    Types: 1 - 2 Glasses of wine per week   Drug use: No    Review of Systems:   Review of Systems  Gastrointestinal:  Negative for constipation.  Genitourinary:        -vaginal bleeding  +painful intercourse  Musculoskeletal:   Positive for back pain.       +right side pain  Psychiatric/Behavioral:  The patient has insomnia.     Objective:   LMP 07/10/2020  There is no height or weight on file to calculate BMI.   General Appearance:    Alert, cooperative, no distress, appears stated age  Head:    Normocephalic, without obvious abnormality, atraumatic  Eyes:    PERRL, conjunctiva/corneas clear, EOM's intact, fundi  benign, both eyes  Ears:    Normal TM's and external ear canals, both ears  Nose:   Nares normal, septum midline, mucosa normal, no drainage    or sinus tenderness  Throat:   Lips, mucosa, and tongue normal; teeth and gums normal  Neck:   Supple, symmetrical, trachea midline, no adenopathy;    thyroid:  no enlargement/tenderness/nodules; no carotid   bruit or JVD  Back:     Symmetric, no curvature, ROM normal, no CVA tenderness  Lungs:     Clear to auscultation bilaterally, respirations unlabored  Chest Wall:    No tenderness or deformity   Heart:    Regular rate and rhythm, S1 and S2 normal, no murmur, rub or gallop  Breast Exam:    Deferred   Abdomen:     Soft, non-tender, bowel sounds active all four quadrants,    no masses, no organomegaly  Genitalia:    Deferred  Extremities:   Extremities normal, atraumatic, no cyanosis or edema  Pulses:   2+ and symmetric all extremities  Skin:   Skin color, texture, turgor normal, no rashes or lesions  Lymph nodes:   Cervical, supraclavicular, and axillary nodes normal  Neurologic:   CNII-XII intact, normal strength, sensation and reflexes    throughout    Assessment/Plan:   Routine physical examination Today patient counseled on age appropriate routine health concerns for screening and prevention, each reviewed and up to date or declined. Immunizations reviewed and up to date or declined. Labs ordered and reviewed. Risk factors for depression reviewed and negative. Hearing function and visual acuity are intact. ADLs screened and addressed as  needed. Functional ability and level of safety reviewed and appropriate. Education, counseling and referrals performed based on assessed risks today. Patient provided with a copy of personalized plan for preventive services.  Insulin resistance Update A1c and adjust metformin 500 mg daily as indicated Not interested in GLP-1  Hyperlipidemia, unspecified hyperlipidemia type Update lipid panel and adjust crestor 5 mg thrice weekly as indicated  Bipolar 1 disorder (HCC) Overall controlled per patient request Denies SI/HI  Primary hypertension Normotensive Continue lisinopril-hydrochloroTHIAZIDE 20-25 mg daily Follow-up in 1 year, sooner if concerns  Encounter for screening for other viral diseases Update hepatitis C  Primary open angle glaucoma (POAG) of both eyes, mild stage Added to problem list Avoid steroids in future if able  Right flank discomfort Possibly related to her back -- 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   I, Jarold Motto, [place], have reviewed all documentation for this visit. The documentation on 05/16/23 for the exam, diagnosis, procedures, and orders are all accurate and complete.   Jarold Motto, PA-C [facility]

## 2023-05-16 NOTE — Patient Instructions (Addendum)
It was great to see you!  Goal for exercise moderate walking, 3-5 times/week for 30-50 minutes each session. Aim for at least 150 minutes.week. Goal should be pace of 3 miles/hours, or walking 1.5 miles in 30 minutes  Please go to the lab for blood work.   Our office will call you with your results unless you have chosen to receive results via MyChart.  If your blood work is normal we will follow-up each year for physicals and as scheduled for chronic medical problems.  If anything is abnormal we will treat accordingly and get you in for a follow-up.  Take care,  Lelon Mast

## 2023-05-17 ENCOUNTER — Encounter: Payer: Self-pay | Admitting: Physician Assistant

## 2023-05-18 ENCOUNTER — Other Ambulatory Visit: Payer: Self-pay | Admitting: *Deleted

## 2023-05-18 DIAGNOSIS — E785 Hyperlipidemia, unspecified: Secondary | ICD-10-CM

## 2023-05-18 MED ORDER — ROSUVASTATIN CALCIUM 5 MG PO TABS: 5.0000 mg | ORAL_TABLET | ORAL | 5 refills | Status: DC

## 2023-05-30 DIAGNOSIS — N760 Acute vaginitis: Secondary | ICD-10-CM | POA: Diagnosis not present

## 2023-05-30 DIAGNOSIS — N941 Unspecified dyspareunia: Secondary | ICD-10-CM | POA: Diagnosis not present

## 2023-05-30 DIAGNOSIS — B3731 Acute candidiasis of vulva and vagina: Secondary | ICD-10-CM | POA: Diagnosis not present

## 2023-06-02 DIAGNOSIS — F3132 Bipolar disorder, current episode depressed, moderate: Secondary | ICD-10-CM | POA: Diagnosis not present

## 2023-06-19 ENCOUNTER — Other Ambulatory Visit: Payer: Self-pay | Admitting: Physician Assistant

## 2023-06-30 DIAGNOSIS — Z01419 Encounter for gynecological examination (general) (routine) without abnormal findings: Secondary | ICD-10-CM | POA: Diagnosis not present

## 2023-06-30 DIAGNOSIS — Z113 Encounter for screening for infections with a predominantly sexual mode of transmission: Secondary | ICD-10-CM | POA: Diagnosis not present

## 2023-06-30 DIAGNOSIS — Z1331 Encounter for screening for depression: Secondary | ICD-10-CM | POA: Diagnosis not present

## 2023-06-30 DIAGNOSIS — Z124 Encounter for screening for malignant neoplasm of cervix: Secondary | ICD-10-CM | POA: Diagnosis not present

## 2023-06-30 DIAGNOSIS — Z01411 Encounter for gynecological examination (general) (routine) with abnormal findings: Secondary | ICD-10-CM | POA: Diagnosis not present

## 2023-07-07 LAB — HM PAP SMEAR: HPV, high-risk: NEGATIVE

## 2023-07-14 ENCOUNTER — Other Ambulatory Visit: Payer: Self-pay | Admitting: Physician Assistant

## 2023-07-15 DIAGNOSIS — H401131 Primary open-angle glaucoma, bilateral, mild stage: Secondary | ICD-10-CM | POA: Diagnosis not present

## 2023-07-15 DIAGNOSIS — H2513 Age-related nuclear cataract, bilateral: Secondary | ICD-10-CM | POA: Diagnosis not present

## 2023-07-19 DIAGNOSIS — R87612 Low grade squamous intraepithelial lesion on cytologic smear of cervix (LGSIL): Secondary | ICD-10-CM | POA: Diagnosis not present

## 2023-07-19 DIAGNOSIS — Z3202 Encounter for pregnancy test, result negative: Secondary | ICD-10-CM | POA: Diagnosis not present

## 2023-07-19 DIAGNOSIS — N882 Stricture and stenosis of cervix uteri: Secondary | ICD-10-CM | POA: Diagnosis not present

## 2023-07-25 DIAGNOSIS — R87612 Low grade squamous intraepithelial lesion on cytologic smear of cervix (LGSIL): Secondary | ICD-10-CM | POA: Diagnosis not present

## 2023-08-25 DIAGNOSIS — F3132 Bipolar disorder, current episode depressed, moderate: Secondary | ICD-10-CM | POA: Diagnosis not present

## 2023-09-27 ENCOUNTER — Ambulatory Visit: Payer: PPO

## 2023-09-27 VITALS — Wt 257.0 lb

## 2023-09-27 DIAGNOSIS — Z0000 Encounter for general adult medical examination without abnormal findings: Secondary | ICD-10-CM | POA: Diagnosis not present

## 2023-09-27 NOTE — Progress Notes (Deleted)
Subjective:   [name] is a 57 y.o. female who presents for Medicare Annual (Subsequent) preventive examination.  Visit Complete: Virtual I connected with  [name] on 09/27/23 by a audio enabled telemedicine application and verified that I am speaking with the correct person using two identifiers.  Patient Location: Home  Provider Location: Office/Clinic  I discussed the limitations of evaluation and management by telemedicine. The patient expressed understanding and agreed to proceed.  Vital Signs: Because this visit was a virtual/telehealth visit, some criteria may be missing or patient reported. Any vitals not documented were not able to be obtained and vitals that have been documented are patient reported.  Patient Medicare AWV questionnaire was completed by the patient on 09/23/23; I have confirmed that all information answered by patient is correct and no changes since this date.  Cardiac Risk Factors include: advanced age (>106men, >53 women);hypertension;obesity (BMI >30kg/m2)     Objective:    Today's Vitals   09/27/23 1005  Weight: 257 lb (116.6 kg)   Body mass index is 50.19 kg/m.     09/27/2023   10:10 AM 11/25/2022    4:42 PM 09/23/2022    9:29 AM 09/14/2021    8:56 AM 09/05/2020    9:04 AM 07/30/2019   12:39 PM 03/20/2019   10:43 AM  Advanced Directives  Does Patient Have a Medical Advance Directive? No No No Yes No No No  Type of Print production planner of Healthcare Power of Attorney in Chart?    No - copy requested     Would patient like information on creating a medical advance directive? No - Patient declined  No - Patient declined  Yes (MAU/Ambulatory/Procedural Areas - Information given) Yes (MAU/Ambulatory/Procedural Areas - Information given) No - Patient declined    Current Medications (verified) Outpatient Encounter Medications as of 09/27/2023  Medication Sig   albuterol (VENTOLIN HFA) 108 (90 Base)  MCG/ACT inhaler Inhale 1-2 puffs into the lungs every 6 (six) hours as needed for wheezing or shortness of breath.   ARIPiprazole (ABILIFY) 15 MG tablet Take 15 mg by mouth daily.   cholecalciferol (VITAMIN D3) 25 MCG (1000 UNIT) tablet Take 5,000 Units by mouth daily.   ferrous sulfate 325 (65 FE) MG tablet Take 325 mg by mouth daily with breakfast.   folic acid (FOLVITE) 1 MG tablet Take 1 mg by mouth daily.   hydrOXYzine (ATARAX) 25 MG tablet Take 25 mg by mouth 3 (three) times daily.   latanoprost (XALATAN) 0.005 % ophthalmic solution INSTILL 1 DROP INTO BOTH EYES NIGHTLY   lisinopril-hydrochlorothiazide (ZESTORETIC) 20-25 MG tablet TAKE 1 TABLET BY MOUTH DAILY   metFORMIN (GLUCOPHAGE) 500 MG tablet TAKE 1 TABLET BY MOUTH TWICE A DAY WITH A MEAL   rosuvastatin (CRESTOR) 5 MG tablet Take 1 tablet (5 mg total) by mouth 3 (three) times a week.   SHINGRIX injection    traZODone (DESYREL) 100 MG tablet Take 1 tablet (100 mg total) by mouth at bedtime.   vitamin C (ASCORBIC ACID) 500 MG tablet Take 500 mg by mouth daily.   No facility-administered encounter medications on file as of 09/27/2023.    Allergies (verified) Patient has no known allergies.   History: Past Medical History:  Diagnosis Date   Anemia    Anxiety    Asthma    Bipolar 1 disorder (HCC)    Depression    Diabetes mellitus without complication (HCC)  Dyspnea 2020   with exersion   GERD (gastroesophageal reflux disease)    Glaucoma    Heart murmur    HISTORY OF CLOSED IN 2006   History of chicken pox    History of Helicobacter pylori infection    History of migraine    Hypertension    Obesity    PFO (patent foramen ovale)    Renal disorder    Sleep apnea    does not use cpap   Vitamin D deficiency    Past Surgical History:  Procedure Laterality Date   BIOPSY  12/04/2018   Procedure: BIOPSY;  Surgeon: Tressia Danas, MD;  Location: [facility];  Service: Gastroenterology;;  EGD and Colon   BREAST  REDUCTION SURGERY  1986   CHOLECYSTECTOMY N/A 03/23/2019   Procedure: LAPAROSCOPIC CHOLECYSTECTOMY;  Surgeon: Berna Bue, MD;  Location: [facility];  Service: General;  Laterality: N/A;   COLONOSCOPY WITH PROPOFOL N/A 12/04/2018   Procedure: COLONOSCOPY WITH PROPOFOL;  Surgeon: Tressia Danas, MD;  Location: [facility];  Service: Gastroenterology;  Laterality: N/A;   ESOPHAGOGASTRODUODENOSCOPY (EGD) WITH PROPOFOL N/A 12/04/2018   Procedure: ESOPHAGOGASTRODUODENOSCOPY (EGD) WITH PROPOFOL;  Surgeon: Tressia Danas, MD;  Location: [facility];  Service: Gastroenterology;  Laterality: N/A;   PATENT FORAMEN OVALE(PFO) CLOSURE  2006   REDUCTION MAMMAPLASTY     TONSILLECTOMY     WISDOM TOOTH EXTRACTION     Family History  Problem Relation Age of Onset   Diabetes Mother    Hypertension Mother    COPD Mother    Obesity Mother    Stroke Father    Hypertension Father    Hypertension Sister    Diabetes Maternal Grandmother    CVA Maternal Grandmother    Diabetes Maternal Grandfather    Colon cancer Paternal Grandmother    Diabetes Paternal Grandfather    Breast cancer Maternal Aunt    Asthma Other    Cancer Other    COPD Other    Esophageal cancer Neg Hx    Rectal cancer Neg Hx    Stomach cancer Neg Hx    Liver disease Neg Hx    Colon polyps Neg Hx    Social History   Socioeconomic History   Marital status: Single    Spouse name: Not on file   Number of children: Not on file   Years of education: Not on file   Highest education level: Not on file  Occupational History   Occupation: disability  Tobacco Use   Smoking status: Never   Smokeless tobacco: Never  Vaping Use   Vaping status: Never Used  Substance and Sexual Activity   Alcohol use: Yes    Alcohol/week: 1.0 - 2.0 standard drink of alcohol    Types: 1 - 2 Standard drinks or equivalent per week    Comment: very rare 1-2 drinks per month   Drug use: No   Sexual activity: Yes    Birth control/protection:  None  Other Topics Concern   Not on file  Social History Narrative   Lives in [place], was living in [place], [place] for 20 years   No children, not married   Single   Family is nearby, mom and sister nearby   Currently on disability: 4 years ago she had an episode of psychosis (states that she didn't sleep for ~6-8 days prior to) and was in and out of mental institution for 6 months, lost her job and had to move back to [place] to be near family  Social Drivers of Corporate investment banker Strain: Low Risk  (09/27/2023)   Overall Financial Resource Strain (CARDIA)    Difficulty of Paying Living Expenses: Not hard at all  Food Insecurity: No Food Insecurity (09/27/2023)   Hunger Vital Sign    Worried About Running Out of Food in the Last Year: Never true    Ran Out of Food in the Last Year: Never true  Transportation Needs: No Transportation Needs (09/27/2023)   PRAPARE - Administrator, Civil Service (Medical): No    Lack of Transportation (Non-Medical): No  Physical Activity: Insufficiently Active (09/27/2023)   Exercise Vital Sign    Days of Exercise per Week: 3 days    Minutes of Exercise per Session: 30 min  Stress: No Stress Concern Present (09/27/2023)   [employer] of Occupational Health - Occupational Stress Questionnaire    Feeling of Stress : Not at all  Social Connections: Socially Isolated (09/27/2023)   Social Connection and Isolation Panel [NHANES]    Frequency of Communication with Friends and Family: More than three times a week    Frequency of Social Gatherings with Friends and Family: More than three times a week    Attends Religious Services: Never    Database administrator or Organizations: No    Attends Engineer, structural: Never    Marital Status: Never married    Tobacco Counseling Counseling given: Not Answered   Clinical Intake:  Pre-visit preparation completed: Yes  Pain : No/denies pain     BMI - recorded:  50.19 Nutritional Status: BMI > 30  Obese Nutritional Risks: None Diabetes: No  How often do you need to have someone help you when you read instructions, pamphlets, or other written materials from your doctor or pharmacy?: 1 - Never  Interpreter Needed?: No  Information entered by :: Lanier Ensign, LPN   Activities of Daily Living    09/23/2023    9:15 AM  In your present state of health, do you have any difficulty performing the following activities:  Hearing? 0  Vision? 0  Difficulty concentrating or making decisions? 1  Comment at times  Walking or climbing stairs? 1  Comment knees and distance get winded  Dressing or bathing? 0  Doing errands, shopping? 0  Preparing Food and eating ? N  Using the Toilet? N  In the past six months, have you accidently leaked urine? Y  Comment wears a pad  Do you have problems with loss of bowel control? N  Managing your Medications? N  Managing your Finances? N  Housekeeping or managing your Housekeeping? N    Patient Care Team: Jarold Motto, [place] as PCP - General (Physician Assistant) Tressia Danas, MD (Inactive) as Consulting Physician (Gastroenterology) Rene Kocher Bo Mcclintock, MD as Consulting Physician (Psychiatry) Erroll Luna, [facility] (Inactive) as Pharmacist (Pharmacist)  Indicate any recent Medical Services you may have received from other than [facility] providers in the past year (date may be approximate).     Assessment:   This is a routine wellness examination for [place].  Hearing/Vision screen Hearing Screening - Comments:: Pt denies any hearing issues  Vision Screening - Comments:: Pt follows up with Dr Sallye Lat for annual eye exams    Goals Addressed             This Visit's Progress    Patient Stated       Work on losing 50 lbs        Depression  Screen    09/27/2023   10:09 AM 05/16/2023   10:50 AM 09/23/2022    9:27 AM 09/14/2021    8:54 AM 09/05/2020    9:02 AM 07/30/2019   12:39 PM  07/06/2018    9:56 AM  PHQ 2/9 Scores  PHQ - 2 Score 1 2 1 2 1  0 1  PHQ- 9 Score  5  4       Fall Risk    09/23/2023    9:15 AM 09/23/2022    9:30 AM 09/19/2022    9:48 AM 09/14/2021    8:58 AM 09/05/2020    9:05 AM  Fall Risk   Falls in the past year? 0 0 0 0 0  Number falls in past yr: 0 0 0 0 0  Injury with Fall? 0 0 0 0 0  Risk for fall due to : Orthopedic patient;Impaired mobility;Impaired balance/gait Impaired vision  Impaired vision Impaired vision  Follow up Falls prevention discussed Falls prevention discussed  Falls prevention discussed Falls prevention discussed    MEDICARE RISK AT HOME: Medicare Risk at Home Any stairs in or around the home?: (Patient-Rptd) No If so, are there any without handrails?: (Patient-Rptd) No Home free of loose throw rugs in walkways, pet beds, electrical cords, etc?: (Patient-Rptd) Yes Adequate lighting in your home to reduce risk of falls?: (Patient-Rptd) Yes Life alert?: (Patient-Rptd) No Use of a cane, walker or w/c?: (Patient-Rptd) No Grab bars in the bathroom?: (Patient-Rptd) No Shower chair or bench in shower?: (Patient-Rptd) No Elevated toilet seat or a handicapped toilet?: (Patient-Rptd) No  TIMED UP AND GO:  Was the test performed?  No    Cognitive Function:        09/23/2022    9:30 AM 09/14/2021    9:00 AM 09/05/2020    9:08 AM  6CIT Screen  What Year? 0 points 0 points 0 points  What month? 0 points 0 points 0 points  What time? 0 points 0 points   Count back from 20 0 points 0 points 0 points  Months in reverse 0 points 0 points 0 points  Repeat phrase 2 points 0 points 0 points  Total Score 2 points 0 points     Immunizations Immunization History  Administered Date(s) Administered   PFIZER(Purple Top)SARS-COV-2 Vaccination 01/05/2020, 01/29/2020, 08/11/2020   Tdap 05/12/2018   Zoster Recombinant(Shingrix) 04/18/2023, 05/19/2023    TDAP status: Up to date  Flu Vaccine status: Declined, Education has  been provided regarding the importance of this vaccine but patient still declined. Advised may receive this vaccine at local pharmacy or Health Dept. Aware to provide a copy of the vaccination record if obtained from local pharmacy or Health Dept. Verbalized acceptance and understanding.    Covid-19 vaccine status: Information provided on how to obtain vaccines.   Qualifies for Shingles Vaccine? Yes   Zostavax completed Yes   Shingrix Completed?: Yes  Screening Tests Health Maintenance  Topic Date Due   Cervical Cancer Screening (HPV/Pap Cotest)  10/09/2018   COVID-19 Vaccine (4 - 2024-25 season) 06/05/2023   INFLUENZA VACCINE  01/02/2024 (Originally 05/05/2023)   Medicare Annual Wellness (AWV)  09/26/2024   MAMMOGRAM  10/05/2024   Colonoscopy  11/03/2027   DTaP/Tdap/Td (2 - Td or Tdap) 05/12/2028   Hepatitis C Screening  Completed   HIV Screening  Completed   Zoster Vaccines- Shingrix  Completed   HPV VACCINES  Aged Out    Health Maintenance  Health Maintenance Due  Topic Date Due  Cervical Cancer Screening (HPV/Pap Cotest)  10/09/2018   COVID-19 Vaccine (4 - 2024-25 season) 06/05/2023    Colorectal cancer screening: Type of screening: Colonoscopy. Completed 11/02/22. Repeat every 5 years  Mammogram status: Completed 10/05/22. Repeat every year   Additional Screening:  Hepatitis C Screening:  Completed 05/16/23  Vision Screening: Recommended annual ophthalmology exams for early detection of glaucoma and other disorders of the eye. Is the patient up to date with their annual eye exam?  Yes  Who is the provider or what is the name of the office in which the patient attends annual eye exams? Dr Sallye Lat  If pt is not established with a provider, would they like to be referred to a provider to establish care? No .   Dental Screening: Recommended annual dental exams for proper oral hygiene   Community Resource Referral / Chronic Care Management: CRR required this  visit?  No   CCM required this visit?  No     Plan:     I have personally reviewed and noted the following in the patient's chart:   Medical and social history Use of alcohol, tobacco or illicit drugs  Current medications and supplements including opioid prescriptions. Patient is not currently taking opioid prescriptions. Functional ability and status Nutritional status Physical activity Advanced directives List of other physicians Hospitalizations, surgeries, and ER visits in previous 12 months Vitals Screenings to include cognitive, depression, and falls Referrals and appointments  In addition, I have reviewed and discussed with patient certain preventive protocols, quality metrics, and best practice recommendations. A written personalized care plan for preventive services as well as general preventive health recommendations were provided to patient.     Marzella Schlein, LPN   [id number]   After Visit Summary: (MyChart) Due to this being a telephonic visit, the after visit summary with patients personalized plan was offered to patient via MyChart   Nurse Notes: none

## 2023-09-27 NOTE — Patient Instructions (Signed)
[name] , Thank you for taking time to come for your Medicare Wellness Visit. I appreciate your ongoing commitment to your health goals. Please review the following plan we discussed and let me know if I can assist you in the future.   Referrals/Orders/Follow-Ups/Clinician Recommendations: Aim for 30 minutes of exercise or brisk walking, 6-8 glasses of water, and 5 servings of fruits and vegetables each day.   This is a list of the screening recommended for you and due dates:  Health Maintenance  Topic Date Due   Pap with HPV screening  10/09/2018   COVID-19 Vaccine (4 - 2024-25 season) 06/05/2023   Medicare Annual Wellness Visit  09/24/2023   Flu Shot  01/02/2024*   Mammogram  10/05/2024   Colon Cancer Screening  11/03/2027   DTaP/Tdap/Td vaccine (2 - Td or Tdap) 05/12/2028   Hepatitis C Screening  Completed   HIV Screening  Completed   Zoster (Shingles) Vaccine  Completed   HPV Vaccine  Aged Out  *Topic was postponed. The date shown is not the original due date.    Advanced directives: (Declined) Advance directive discussed with you today. Even though you declined this today, please call our office should you change your mind, and we can give you the proper paperwork for you to fill out.  Next Medicare Annual Wellness Visit scheduled for next year: Yes

## 2023-10-03 NOTE — Progress Notes (Signed)
Subjective:   [name] is a 57 y.o. female who presents for Medicare Annual (Subsequent) preventive examination.  Visit Complete: Virtual I connected with  [name] on 10/03/23 by a audio enabled telemedicine application and verified that I am speaking with the correct person using two identifiers.  Patient Location: Home  Provider Location: Office/Clinic  I discussed the limitations of evaluation and management by telemedicine. The patient expressed understanding and agreed to proceed.  Vital Signs: Because this visit was a virtual/telehealth visit, some criteria may be missing or patient reported. Any vitals not documented were not able to be obtained and vitals that have been documented are patient reported.  Patient Medicare AWV questionnaire was completed by the patient on 09/23/23; I have confirmed that all information answered by patient is correct and no changes since this date.  Cardiac Risk Factors include: advanced age (>19men, >52 women);hypertension;obesity (BMI >30kg/m2)     Objective:    Today's Vitals   09/27/23 1005  Weight: 257 lb (116.6 kg)   Body mass index is 50.19 kg/m.     09/27/2023   10:10 AM 11/25/2022    4:42 PM 09/23/2022    9:29 AM 09/14/2021    8:56 AM 09/05/2020    9:04 AM 07/30/2019   12:39 PM 03/20/2019   10:43 AM  Advanced Directives  Does Patient Have a Medical Advance Directive? No No No Yes No No No  Type of Print production planner of Healthcare Power of Attorney in Chart?    No - copy requested     Would patient like information on creating a medical advance directive? No - Patient declined  No - Patient declined  Yes (MAU/Ambulatory/Procedural Areas - Information given) Yes (MAU/Ambulatory/Procedural Areas - Information given) No - Patient declined    Current Medications (verified) Outpatient Encounter Medications as of 09/27/2023  Medication Sig   albuterol (VENTOLIN HFA) 108 (90 Base)  MCG/ACT inhaler Inhale 1-2 puffs into the lungs every 6 (six) hours as needed for wheezing or shortness of breath.   ARIPiprazole (ABILIFY) 15 MG tablet Take 15 mg by mouth daily.   cholecalciferol (VITAMIN D3) 25 MCG (1000 UNIT) tablet Take 5,000 Units by mouth daily.   ferrous sulfate 325 (65 FE) MG tablet Take 325 mg by mouth daily with breakfast.   folic acid (FOLVITE) 1 MG tablet Take 1 mg by mouth daily.   hydrOXYzine (ATARAX) 25 MG tablet Take 25 mg by mouth 3 (three) times daily.   latanoprost (XALATAN) 0.005 % ophthalmic solution INSTILL 1 DROP INTO BOTH EYES NIGHTLY   lisinopril-hydrochlorothiazide (ZESTORETIC) 20-25 MG tablet TAKE 1 TABLET BY MOUTH DAILY   metFORMIN (GLUCOPHAGE) 500 MG tablet TAKE 1 TABLET BY MOUTH TWICE A DAY WITH A MEAL   rosuvastatin (CRESTOR) 5 MG tablet Take 1 tablet (5 mg total) by mouth 3 (three) times a week.   SHINGRIX injection    traZODone (DESYREL) 100 MG tablet Take 1 tablet (100 mg total) by mouth at bedtime.   vitamin C (ASCORBIC ACID) 500 MG tablet Take 500 mg by mouth daily.   No facility-administered encounter medications on file as of 09/27/2023.    Allergies (verified) Patient has no known allergies.   History: Past Medical History:  Diagnosis Date   Anemia    Anxiety    Asthma    Bipolar 1 disorder (HCC)    Depression    Diabetes mellitus without complication (HCC)  Dyspnea 2020   with exersion   GERD (gastroesophageal reflux disease)    Glaucoma    Heart murmur    HISTORY OF CLOSED IN 2006   History of chicken pox    History of Helicobacter pylori infection    History of migraine    Hypertension    Obesity    PFO (patent foramen ovale)    Renal disorder    Sleep apnea    does not use cpap   Vitamin D deficiency    Past Surgical History:  Procedure Laterality Date   BIOPSY  12/04/2018   Procedure: BIOPSY;  Surgeon: Tressia Danas, MD;  Location: [facility];  Service: Gastroenterology;;  EGD and Colon   BREAST  REDUCTION SURGERY  1986   CHOLECYSTECTOMY N/A 03/23/2019   Procedure: LAPAROSCOPIC CHOLECYSTECTOMY;  Surgeon: Berna Bue, MD;  Location: [facility];  Service: General;  Laterality: N/A;   COLONOSCOPY WITH PROPOFOL N/A 12/04/2018   Procedure: COLONOSCOPY WITH PROPOFOL;  Surgeon: Tressia Danas, MD;  Location: [facility];  Service: Gastroenterology;  Laterality: N/A;   ESOPHAGOGASTRODUODENOSCOPY (EGD) WITH PROPOFOL N/A 12/04/2018   Procedure: ESOPHAGOGASTRODUODENOSCOPY (EGD) WITH PROPOFOL;  Surgeon: Tressia Danas, MD;  Location: [facility];  Service: Gastroenterology;  Laterality: N/A;   PATENT FORAMEN OVALE(PFO) CLOSURE  2006   REDUCTION MAMMAPLASTY     TONSILLECTOMY     WISDOM TOOTH EXTRACTION     Family History  Problem Relation Age of Onset   Diabetes Mother    Hypertension Mother    COPD Mother    Obesity Mother    Stroke Father    Hypertension Father    Hypertension Sister    Diabetes Maternal Grandmother    CVA Maternal Grandmother    Diabetes Maternal Grandfather    Colon cancer Paternal Grandmother    Diabetes Paternal Grandfather    Breast cancer Maternal Aunt    Asthma Other    Cancer Other    COPD Other    Esophageal cancer Neg Hx    Rectal cancer Neg Hx    Stomach cancer Neg Hx    Liver disease Neg Hx    Colon polyps Neg Hx    Social History   Socioeconomic History   Marital status: Single    Spouse name: Not on file   Number of children: Not on file   Years of education: Not on file   Highest education level: Not on file  Occupational History   Occupation: disability  Tobacco Use   Smoking status: Never   Smokeless tobacco: Never  Vaping Use   Vaping status: Never Used  Substance and Sexual Activity   Alcohol use: Yes    Alcohol/week: 1.0 - 2.0 standard drink of alcohol    Types: 1 - 2 Standard drinks or equivalent per week    Comment: very rare 1-2 drinks per month   Drug use: No   Sexual activity: Yes    Birth control/protection:  None  Other Topics Concern   Not on file  Social History Narrative   Lives in [place], was living in [place], [place] for 20 years   No children, not married   Single   Family is nearby, mom and sister nearby   Currently on disability: 4 years ago she had an episode of psychosis (states that she didn't sleep for ~6-8 days prior to) and was in and out of mental institution for 6 months, lost her job and had to move back to [place] to be near family  Social Drivers of Corporate investment banker Strain: Low Risk  (09/27/2023)   Overall Financial Resource Strain (CARDIA)    Difficulty of Paying Living Expenses: Not hard at all  Food Insecurity: No Food Insecurity (09/27/2023)   Hunger Vital Sign    Worried About Running Out of Food in the Last Year: Never true    Ran Out of Food in the Last Year: Never true  Transportation Needs: No Transportation Needs (09/27/2023)   PRAPARE - Administrator, Civil Service (Medical): No    Lack of Transportation (Non-Medical): No  Physical Activity: Insufficiently Active (09/27/2023)   Exercise Vital Sign    Days of Exercise per Week: 3 days    Minutes of Exercise per Session: 30 min  Stress: No Stress Concern Present (09/27/2023)   [employer] of Occupational Health - Occupational Stress Questionnaire    Feeling of Stress : Not at all  Social Connections: Socially Isolated (09/27/2023)   Social Connection and Isolation Panel [NHANES]    Frequency of Communication with Friends and Family: More than three times a week    Frequency of Social Gatherings with Friends and Family: More than three times a week    Attends Religious Services: Never    Database administrator or Organizations: No    Attends Engineer, structural: Never    Marital Status: Never married    Tobacco Counseling Counseling given: Not Answered   Clinical Intake:  Pre-visit preparation completed: Yes  Pain : No/denies pain     BMI - recorded:  50.19 Nutritional Status: BMI > 30  Obese Nutritional Risks: None Diabetes: No  How often do you need to have someone help you when you read instructions, pamphlets, or other written materials from your doctor or pharmacy?: 1 - Never  Interpreter Needed?: No  Information entered by :: Lanier Ensign, LPN   Activities of Daily Living    09/23/2023    9:15 AM  In your present state of health, do you have any difficulty performing the following activities:  Hearing? 0  Vision? 0  Difficulty concentrating or making decisions? 1  Comment at times  Walking or climbing stairs? 1  Comment knees and distance get winded  Dressing or bathing? 0  Doing errands, shopping? 0  Preparing Food and eating ? N  Using the Toilet? N  In the past six months, have you accidently leaked urine? Y  Comment wears a pad  Do you have problems with loss of bowel control? N  Managing your Medications? N  Managing your Finances? N  Housekeeping or managing your Housekeeping? N    Patient Care Team: Jarold Motto, [place] as PCP - General (Physician Assistant) Tressia Danas, MD (Inactive) as Consulting Physician (Gastroenterology) Rene Kocher Bo Mcclintock, MD as Consulting Physician (Psychiatry) Erroll Luna, [facility] (Inactive) as Pharmacist (Pharmacist)  Indicate any recent Medical Services you may have received from other than [facility] providers in the past year (date may be approximate).     Assessment:   This is a routine wellness examination for [name].  Hearing/Vision screen Hearing Screening - Comments:: Pt denies any hearing issues  Vision Screening - Comments:: Pt follows up with Dr Sallye Lat for annual eye exams    Goals Addressed             This Visit's Progress    Patient Stated       Work on losing 50 lbs        Depression  Screen    09/27/2023   10:09 AM 05/16/2023   10:50 AM 09/23/2022    9:27 AM 09/14/2021    8:54 AM 09/05/2020    9:02 AM 07/30/2019   12:39 PM  07/06/2018    9:56 AM  PHQ 2/9 Scores  PHQ - 2 Score 1 2 1 2 1  0 1  PHQ- 9 Score  5  4       Fall Risk    09/23/2023    9:15 AM 09/23/2022    9:30 AM 09/19/2022    9:48 AM 09/14/2021    8:58 AM 09/05/2020    9:05 AM  Fall Risk   Falls in the past year? 0 0 0 0 0  Number falls in past yr: 0 0 0 0 0  Injury with Fall? 0 0 0 0 0  Risk for fall due to : Orthopedic patient;Impaired mobility;Impaired balance/gait Impaired vision  Impaired vision Impaired vision  Follow up Falls prevention discussed Falls prevention discussed  Falls prevention discussed Falls prevention discussed    MEDICARE RISK AT HOME: Medicare Risk at Home Any stairs in or around the home?: (Patient-Rptd) No If so, are there any without handrails?: (Patient-Rptd) No Home free of loose throw rugs in walkways, pet beds, electrical cords, etc?: (Patient-Rptd) Yes Adequate lighting in your home to reduce risk of falls?: (Patient-Rptd) Yes Life alert?: (Patient-Rptd) No Use of a cane, walker or w/c?: (Patient-Rptd) No Grab bars in the bathroom?: (Patient-Rptd) No Shower chair or bench in shower?: (Patient-Rptd) No Elevated toilet seat or a handicapped toilet?: (Patient-Rptd) No  TIMED UP AND GO:  Was the test performed?  No    Cognitive Function:        09/27/2023   10:21 AM 09/23/2022    9:30 AM 09/14/2021    9:00 AM 09/05/2020    9:08 AM  6CIT Screen  What Year? 0 points 0 points 0 points 0 points  What month? 0 points 0 points 0 points 0 points  What time? 0 points 0 points 0 points   Count back from 20 0 points 0 points 0 points 0 points  Months in reverse 0 points 0 points 0 points 0 points  Repeat phrase 0 points 2 points 0 points 0 points  Total Score 0 points 2 points 0 points     Immunizations Immunization History  Administered Date(s) Administered   PFIZER(Purple Top)SARS-COV-2 Vaccination 01/05/2020, 01/29/2020, 08/11/2020   Tdap 05/12/2018   Zoster Recombinant(Shingrix) 04/18/2023,  05/19/2023    TDAP status: Up to date  Flu Vaccine status: Declined, Education has been provided regarding the importance of this vaccine but patient still declined. Advised may receive this vaccine at local pharmacy or Health Dept. Aware to provide a copy of the vaccination record if obtained from local pharmacy or Health Dept. Verbalized acceptance and understanding.    Covid-19 vaccine status: Information provided on how to obtain vaccines.   Qualifies for Shingles Vaccine? Yes   Zostavax completed Yes   Shingrix Completed?: Yes  Screening Tests Health Maintenance  Topic Date Due   Cervical Cancer Screening (HPV/Pap Cotest)  10/09/2018   COVID-19 Vaccine (4 - 2024-25 season) 06/05/2023   INFLUENZA VACCINE  01/02/2024 (Originally 05/05/2023)   Medicare Annual Wellness (AWV)  09/26/2024   MAMMOGRAM  10/05/2024   Colonoscopy  11/03/2027   DTaP/Tdap/Td (2 - Td or Tdap) 05/12/2028   Hepatitis C Screening  Completed   HIV Screening  Completed   Zoster Vaccines- Shingrix  Completed  HPV VACCINES  Aged Out    Health Maintenance  Health Maintenance Due  Topic Date Due   Cervical Cancer Screening (HPV/Pap Cotest)  10/09/2018   COVID-19 Vaccine (4 - 2024-25 season) 06/05/2023    Colorectal cancer screening: Type of screening: Colonoscopy. Completed 11/02/22. Repeat every 5 years  Mammogram status: Completed 10/05/22. Repeat every year   Additional Screening:  Hepatitis C Screening:  Completed 05/16/23  Vision Screening: Recommended annual ophthalmology exams for early detection of glaucoma and other disorders of the eye. Is the patient up to date with their annual eye exam?  Yes  Who is the provider or what is the name of the office in which the patient attends annual eye exams? Dr Sallye Lat  If pt is not established with a provider, would they like to be referred to a provider to establish care? No .   Dental Screening: Recommended annual dental exams for proper oral  hygiene   Community Resource Referral / Chronic Care Management: CRR required this visit?  No   CCM required this visit?  No     Plan:     I have personally reviewed and noted the following in the patient's chart:   Medical and social history Use of alcohol, tobacco or illicit drugs  Current medications and supplements including opioid prescriptions. Patient is not currently taking opioid prescriptions. Functional ability and status Nutritional status Physical activity Advanced directives List of other physicians Hospitalizations, surgeries, and ER visits in previous 12 months Vitals Screenings to include cognitive, depression, and falls Referrals and appointments  In addition, I have reviewed and discussed with patient certain preventive protocols, quality metrics, and best practice recommendations. A written personalized care plan for preventive services as well as general preventive health recommendations were provided to patient.     Marzella Schlein, LPN   [id number]   After Visit Summary: (MyChart) Due to this being a telephonic visit, the after visit summary with patients personalized plan was offered to patient via MyChart   Nurse Notes: none

## 2023-10-20 DIAGNOSIS — F3132 Bipolar disorder, current episode depressed, moderate: Secondary | ICD-10-CM | POA: Diagnosis not present

## 2023-11-07 ENCOUNTER — Other Ambulatory Visit: Payer: Self-pay | Admitting: Physician Assistant

## 2023-11-07 DIAGNOSIS — Z1231 Encounter for screening mammogram for malignant neoplasm of breast: Secondary | ICD-10-CM

## 2023-11-14 DIAGNOSIS — H2513 Age-related nuclear cataract, bilateral: Secondary | ICD-10-CM | POA: Diagnosis not present

## 2023-11-14 DIAGNOSIS — H401131 Primary open-angle glaucoma, bilateral, mild stage: Secondary | ICD-10-CM | POA: Diagnosis not present

## 2023-11-17 DIAGNOSIS — F3132 Bipolar disorder, current episode depressed, moderate: Secondary | ICD-10-CM | POA: Diagnosis not present

## 2023-11-30 ENCOUNTER — Ambulatory Visit
Admission: RE | Admit: 2023-11-30 | Discharge: 2023-11-30 | Disposition: A | Discharge status: Home / Self Care | Payer: PPO | Source: Ambulatory Visit | Attending: Physician Assistant | Admitting: Physician Assistant

## 2023-11-30 DIAGNOSIS — Z1231 Encounter for screening mammogram for malignant neoplasm of breast: Secondary | ICD-10-CM | POA: Diagnosis not present

## 2024-01-17 DIAGNOSIS — F3132 Bipolar disorder, current episode depressed, moderate: Secondary | ICD-10-CM | POA: Diagnosis not present

## 2024-03-19 DIAGNOSIS — H2513 Age-related nuclear cataract, bilateral: Secondary | ICD-10-CM | POA: Diagnosis not present

## 2024-03-19 DIAGNOSIS — H401131 Primary open-angle glaucoma, bilateral, mild stage: Secondary | ICD-10-CM | POA: Diagnosis not present

## 2024-04-09 DIAGNOSIS — F3132 Bipolar disorder, current episode depressed, moderate: Secondary | ICD-10-CM | POA: Diagnosis not present

## 2024-04-12 ENCOUNTER — Ambulatory Visit (INDEPENDENT_AMBULATORY_CARE_PROVIDER_SITE_OTHER): Admitting: Physician Assistant

## 2024-04-12 ENCOUNTER — Ambulatory Visit (INDEPENDENT_AMBULATORY_CARE_PROVIDER_SITE_OTHER)

## 2024-04-12 ENCOUNTER — Ambulatory Visit: Admitting: Family Medicine

## 2024-04-12 ENCOUNTER — Encounter: Payer: Self-pay | Admitting: Physician Assistant

## 2024-04-12 ENCOUNTER — Encounter: Payer: Self-pay | Admitting: Family Medicine

## 2024-04-12 VITALS — BP 132/88 | HR 84 | Ht 60.0 in | Wt 272.0 lb

## 2024-04-12 VITALS — BP 132/88 | HR 84 | Temp 98.0°F | Ht 60.0 in | Wt 272.0 lb

## 2024-04-12 DIAGNOSIS — M545 Low back pain, unspecified: Secondary | ICD-10-CM

## 2024-04-12 DIAGNOSIS — M79605 Pain in left leg: Secondary | ICD-10-CM

## 2024-04-12 DIAGNOSIS — G8929 Other chronic pain: Secondary | ICD-10-CM | POA: Diagnosis not present

## 2024-04-12 DIAGNOSIS — M5442 Lumbago with sciatica, left side: Secondary | ICD-10-CM

## 2024-04-12 DIAGNOSIS — M48061 Spinal stenosis, lumbar region without neurogenic claudication: Secondary | ICD-10-CM | POA: Diagnosis not present

## 2024-04-12 DIAGNOSIS — M79604 Pain in right leg: Secondary | ICD-10-CM

## 2024-04-12 DIAGNOSIS — M4316 Spondylolisthesis, lumbar region: Secondary | ICD-10-CM | POA: Diagnosis not present

## 2024-04-12 DIAGNOSIS — M5441 Lumbago with sciatica, right side: Secondary | ICD-10-CM

## 2024-04-12 DIAGNOSIS — M4726 Other spondylosis with radiculopathy, lumbar region: Secondary | ICD-10-CM | POA: Diagnosis not present

## 2024-04-12 DIAGNOSIS — R939 Diagnostic imaging inconclusive due to excess body fat of patient: Secondary | ICD-10-CM | POA: Diagnosis not present

## 2024-04-12 MED ORDER — TIZANIDINE HCL 2 MG PO TABS: 2.0000 mg | ORAL_TABLET | Freq: Three times a day (TID) | ORAL | 1 refills | Status: DC | PRN

## 2024-04-12 MED ORDER — TRAMADOL HCL 50 MG PO TABS: 50.0000 mg | ORAL_TABLET | Freq: Three times a day (TID) | ORAL | 0 refills | Status: DC | PRN

## 2024-04-12 NOTE — Progress Notes (Signed)
 I, Leotis Batter, CMA acting as a scribe for Artist Lloyd, MD.  Pebbles Zeiders [name] is a 58 y.o. female who presents to [facility] at [facility] today for bilat leg pain x 3 months, worsening. Pt locates pain to bilat buttocks w/ radiating pain and weakness into both legs. Sudden onset of sx, worsening over time. Denies injury.  Pulling sensation from the gluteal region into the thighs. Minimal pain when laying down.   Radiating pain: B, R>L LE numbness/tingling: denies LE weakness: B, R>L Aggravates: sit-to-stand, ambulation Treatments tried: IBU, heating pad   Pertinent review of systems: No fevers or chills  Relevant historical information: Open-angle glaucoma.  Hypertension obesity bipolar disorder   Exam:  BP 132/88   Pulse 84   Ht 5' (1.524 m)   Wt 272 lb (123.4 kg)   LMP 07/10/2020   SpO2 98%   BMI 53.12 kg/m  General: Well Developed, well nourished, and in no acute distress.   MSK: L-spine: Normal appearing Nontender to palpation spinal midline.  Tender palpation lumbar paraspinal musculature. Lumbar range of motion limited pain with extension rotation and lateral flexion.  Pain is partially relieved by flexion. Patient does have some functional lower extremity weakness.  She struggles to stand from a seating position however with isolated strength testing I cannot appreciate any significant weakness in exam. Reflexes are diminished but equal bilateral lower extremities.    Lab and Radiology Results  X-ray images lumbar spine obtained today personally and independently interpreted. 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  No acute fractures are visible. Await formal radiology review     Assessment and Plan: 58 y.o. female with chronic low back pain.  Has been ongoing for 3 months worsening over the last 3 weeks.  Patient does note some weakness with standing and walking.  She does not have severe weakness on exam testing today but I may not be sensitive  enough with resisted testing to detect weakness.  She does have pain referred from her low back to the buttocks and proximal posterior thighs but does not have radicular pain radiating down her legs past her knees.  Plan for physical therapy heating pad muscle relaxers and tramadol .  If worsening she can let me know and I will obtain a urgent MRI.   PDMP reviewed during this encounter. Orders Placed This Encounter  Procedures   DG Lumbar Spine 2-3 Views    Standing Status:   Future    Number of Occurrences:   1    Expiration Date:   05/13/2024    Reason for Exam (SYMPTOM  OR DIAGNOSIS REQUIRED):   lumbar radiculitis    Preferred imaging location?:   [facility]    Is patient pregnant?:   No   Ambulatory referral to Physical Therapy    Referral Priority:   Routine    Referral Type:   Physical Medicine    Referral Reason:   Specialty Services Required    Requested Specialty:   Physical Therapy    Number of Visits Requested:   1   Meds ordered this encounter  Medications   traMADol  (ULTRAM ) 50 MG tablet    Sig: Take 1 tablet (50 mg total) by mouth every 8 (eight) hours as needed for severe pain (pain score 7-10).    Dispense:  15 tablet    Refill:  0   tiZANidine  (ZANAFLEX ) 2 MG tablet    Sig: Take 1-2 tablets (2-4 mg total) by mouth every  8 (eight) hours as needed.    Dispense:  60 tablet    Refill:  1     Discussed warning signs or symptoms. Please see discharge instructions. Patient expresses understanding.   The above documentation has been reviewed and is accurate and complete Artist Lloyd, M.D.

## 2024-04-12 NOTE — Patient Instructions (Addendum)
 Thank you for coming in today.   I've referred you to Physical Therapy.  Let us  know if you don't hear from them in one week.   Use the muscle relaxer mostly at bedtime as needed.   Heating pad can help.   Use tramadol  sparingly.   If worsening let me know and I can order an MRI.

## 2024-04-12 NOTE — Progress Notes (Signed)
 [name] is a 58 y.o. female here for a new problem.  History of Present Illness:   Chief Complaint  Patient presents with   Leg Pain    Pt c/o bilateral leg pain, starts in her buttock and radiates down back of legs, x 3 months. Has been taking Ibuprofen  and using heating pad.    HPI  Leg pain Pt complains of bilateral leg pain that radiates from her buttocks to the back of her legs, starting 3 months ago.  She reports it had a sudden onset and has worsened over time. She has never experienced this pain before and not moving or standing eases the pain. Her right side hurts worse and has gained weight due to the pain -- she enjoys line dancing and is unable to participate in that. In fact, she was recently down to 230 pounds before her pain increased and began to significan Pt is unsure of what the cause is. Reports tenderness upon palpation. 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 Endorses taking Ibuprofen  1000 mg twice daily and using a heating pad and cane. Pt reports the Ibuprofen  only helps slightly. Denies fall, car accident, or urinary or bowel incontinence.  Past Medical History:  Diagnosis Date   Anemia    Anxiety    Asthma    Bipolar 1 disorder (HCC)    Depression    Diabetes mellitus without complication (HCC)    Dyspnea 2020   with exersion   GERD (gastroesophageal reflux disease)    Glaucoma    Heart murmur    HISTORY OF CLOSED IN 2006   History of chicken pox    History of Helicobacter pylori infection    History of migraine    Hypertension    Obesity    PFO (patent foramen ovale)    Renal disorder    Sleep apnea    does not use cpap   Vitamin D  deficiency      Social History   Tobacco Use   Smoking status: Never   Smokeless tobacco: Never  Vaping Use   Vaping status: Never Used  Substance Use Topics   Alcohol use: Yes    Alcohol/week: 1.0 - 2.0 standard drink of alcohol    Types: 1 -  2 Standard drinks or equivalent per week    Comment: very rare 1-2 drinks per month   Drug use: No    Past Surgical History:  Procedure Laterality Date   BIOPSY  12/04/2018   Procedure: BIOPSY;  Surgeon: Eda Iha, MD;  Location: [facility];  Service: Gastroenterology;;  EGD and Colon   BREAST REDUCTION SURGERY  1986   CHOLECYSTECTOMY N/A 03/23/2019   Procedure: LAPAROSCOPIC CHOLECYSTECTOMY;  Surgeon: Signe Mitzie LABOR, MD;  Location: [facility];  Service: General;  Laterality: N/A;   COLONOSCOPY WITH PROPOFOL  N/A 12/04/2018   Procedure: COLONOSCOPY WITH PROPOFOL ;  Surgeon: Eda Iha, MD;  Location: [facility];  Service: Gastroenterology;  Laterality: N/A;   ESOPHAGOGASTRODUODENOSCOPY (EGD) WITH PROPOFOL  N/A 12/04/2018   Procedure: ESOPHAGOGASTRODUODENOSCOPY (EGD) WITH PROPOFOL ;  Surgeon: Eda Iha, MD;  Location: [facility];  Service: Gastroenterology;  Laterality: N/A;   PATENT FORAMEN OVALE(PFO) CLOSURE  2006   REDUCTION MAMMAPLASTY     TONSILLECTOMY     WISDOM TOOTH EXTRACTION      Family History  Problem Relation Age of Onset   Diabetes Mother    Hypertension Mother    COPD Mother  Obesity Mother    Stroke Father    Hypertension Father    Hypertension Sister    Diabetes Maternal Grandmother    CVA Maternal Grandmother    Diabetes Maternal Grandfather    Colon cancer Paternal Grandmother    Diabetes Paternal Grandfather    Breast cancer Maternal Aunt    Asthma Other    Cancer Other    COPD Other    Esophageal cancer Neg Hx    Rectal cancer Neg Hx    Stomach cancer Neg Hx    Liver disease Neg Hx    Colon polyps Neg Hx     No Known Allergies  Current Medications:   Current Outpatient Medications:    albuterol  (VENTOLIN  HFA) 108 (90 Base) MCG/ACT inhaler, Inhale 1-2 puffs into the lungs every 6 (six) hours as needed for wheezing or shortness of breath., Disp: 18 g, Rfl: 2   ARIPiprazole  (ABILIFY ) 15 MG tablet, Take 15 mg by mouth  daily., Disp: , Rfl:    cholecalciferol (VITAMIN D3) 25 MCG (1000 UNIT) tablet, Take 5,000 Units by mouth daily., Disp: , Rfl:    ferrous sulfate 325 (65 FE) MG tablet, Take 325 mg by mouth daily with breakfast., Disp: , Rfl:    folic acid (FOLVITE) 1 MG tablet, Take 1 mg by mouth daily., Disp: , Rfl:    hydrOXYzine (ATARAX) 25 MG tablet, Take 25 mg by mouth 3 (three) times daily., Disp: , Rfl:    latanoprost (XALATAN) 0.005 % ophthalmic solution, INSTILL 1 DROP INTO BOTH EYES NIGHTLY, Disp: , Rfl:    lisinopril -hydrochlorothiazide  (ZESTORETIC ) 20-25 MG tablet, TAKE 1 TABLET BY MOUTH DAILY, Disp: 90 tablet, Rfl: 3   metFORMIN  (GLUCOPHAGE ) 500 MG tablet, TAKE 1 TABLET BY MOUTH TWICE A DAY WITH A MEAL, Disp: 180 tablet, Rfl: 1   rosuvastatin  (CRESTOR ) 5 MG tablet, Take 1 tablet (5 mg total) by mouth 3 (three) times a week., Disp: 36 tablet, Rfl: 5   traZODone  (DESYREL ) 100 MG tablet, Take 1 tablet (100 mg total) by mouth at bedtime., Disp: 90 tablet, Rfl: 0   vitamin C (ASCORBIC ACID) 500 MG tablet, Take 500 mg by mouth daily., Disp: , Rfl:    Review of Systems:   Negative unless otherwise specified per HPI.  Vitals:   Vitals:   04/12/24 1100  BP: 132/88  Pulse: 84  Temp: 98 F (36.7 C)  TempSrc: Temporal  SpO2: 99%  Weight: 272 lb (123.4 kg)  Height: 5' (1.524 m)     Body mass index is 53.12 kg/m.  Physical Exam:   Physical Exam Constitutional:      Appearance: Normal appearance. She is well-developed.  HENT:     Head: Normocephalic and atraumatic.  Eyes:     General: Lids are normal.     Extraocular Movements: Extraocular movements intact.     Conjunctiva/sclera: Conjunctivae normal.  Pulmonary:     Effort: Pulmonary effort is normal.  Musculoskeletal:        General: Normal range of motion.     Cervical back: Normal range of motion and neck supple.     Comments: Decreased ROM 2/2 pain with flexion/extension, lateral side bends, or rotation.  Reproducible tenderness  with deep palpation to bilateral paraspinal muscles, lumbar spine, bilateral SI joints No evidence of erythema, rash or ecchymosis.  Negative STLR bilaterally.   Skin:    General: Skin is warm and dry.  Neurological:     Mental Status: She is alert and oriented to  person, place, and time.  Psychiatric:        Attention and Perception: Attention and perception normal.        Mood and Affect: Mood normal.        Behavior: Behavior normal.        Thought Content: Thought content normal.        Judgment: Judgment normal.     Assessment and Plan:   1. Pain in both lower extremities (Primary) Due to severe limited ROM, bony tenderness and worsening of pain, feel patient would best be served by sports medicine Thankfully we were able to get same day appointment with Dr Joane at sports medicine Will defer imaging to them (we do not have xray today due to staff shortage)  Recommend consideration of GLP-1 in the future -- she is very hesitant to do this and we will consider in the future, especially when she is feeling less pain and more open to lengthier discussion about this  I, Lavern Simmers, acting as a Neurosurgeon for [employer], [place]., have documented all relevant documentation on the behalf of Lucie Buttner, [place], as directed by Lucie Buttner, PA while in the presence of Lucie Buttner, [place].  I, Lucie Buttner, [place], have reviewed all documentation for this visit. The documentation on 04/12/24 for the exam, diagnosis, procedures, and orders are all accurate and complete.  Lucie Buttner, PA-C

## 2024-04-12 NOTE — Patient Instructions (Addendum)
 It was great to see you!  Please go see Dr Joane! He will help you!  Take care,  Lucie Buttner PA-C

## 2024-04-16 ENCOUNTER — Ambulatory Visit: Payer: Self-pay | Admitting: Family Medicine

## 2024-04-16 NOTE — Progress Notes (Signed)
Lumbar spine x-ray shows mild arthritis.

## 2024-04-18 ENCOUNTER — Encounter: Payer: Self-pay | Admitting: Physician Assistant

## 2024-05-07 DIAGNOSIS — F3132 Bipolar disorder, current episode depressed, moderate: Secondary | ICD-10-CM | POA: Diagnosis not present

## 2024-05-14 DIAGNOSIS — R262 Difficulty in walking, not elsewhere classified: Secondary | ICD-10-CM | POA: Diagnosis not present

## 2024-05-14 DIAGNOSIS — M25561 Pain in right knee: Secondary | ICD-10-CM | POA: Diagnosis not present

## 2024-05-14 DIAGNOSIS — M25562 Pain in left knee: Secondary | ICD-10-CM | POA: Diagnosis not present

## 2024-05-14 DIAGNOSIS — R2689 Other abnormalities of gait and mobility: Secondary | ICD-10-CM | POA: Diagnosis not present

## 2024-05-23 DIAGNOSIS — F3132 Bipolar disorder, current episode depressed, moderate: Secondary | ICD-10-CM | POA: Diagnosis not present

## 2024-06-25 DIAGNOSIS — F3132 Bipolar disorder, current episode depressed, moderate: Secondary | ICD-10-CM | POA: Diagnosis not present

## 2024-08-01 ENCOUNTER — Other Ambulatory Visit: Payer: Self-pay | Admitting: Physician Assistant

## 2024-08-01 DIAGNOSIS — E785 Hyperlipidemia, unspecified: Secondary | ICD-10-CM

## 2024-08-01 DIAGNOSIS — H2513 Age-related nuclear cataract, bilateral: Secondary | ICD-10-CM | POA: Diagnosis not present

## 2024-08-01 DIAGNOSIS — H401131 Primary open-angle glaucoma, bilateral, mild stage: Secondary | ICD-10-CM | POA: Diagnosis not present

## 2024-08-27 DIAGNOSIS — F3132 Bipolar disorder, current episode depressed, moderate: Secondary | ICD-10-CM | POA: Diagnosis not present

## 2024-09-20 ENCOUNTER — Other Ambulatory Visit: Payer: Self-pay | Admitting: Physician Assistant

## 2024-10-09 ENCOUNTER — Encounter: Payer: Self-pay | Admitting: Physician Assistant

## 2024-10-16 ENCOUNTER — Ambulatory Visit (INDEPENDENT_AMBULATORY_CARE_PROVIDER_SITE_OTHER)

## 2024-10-16 VITALS — BP 121/79 | HR 91 | Ht 60.0 in | Wt 292.0 lb

## 2024-10-16 DIAGNOSIS — Z0000 Encounter for general adult medical examination without abnormal findings: Secondary | ICD-10-CM

## 2024-10-16 NOTE — Progress Notes (Signed)
 "  Chief Complaint  Patient presents with   Medicare Wellness     Subjective:   [name] is a 59 y.o. female who presents for a Medicare Annual Wellness Visit.  Visit info / Clinical Intake: Medicare Wellness Visit Type:: Subsequent Annual Wellness Visit Persons participating in visit and providing information:: patient Medicare Wellness Visit Mode:: Telephone If telephone:: video declined Since this visit was completed virtually, some vitals may be partially provided or unavailable. Missing vitals are due to the limitations of the virtual format.: Documented vitals are patient reported If Telephone or Video please confirm:: I connected with patient using audio/video enable telemedicine. I verified patient identity with two identifiers, discussed telehealth limitations, and patient agreed to proceed. Patient Location:: home Provider Location:: office Interpreter Needed?: No Pre-visit prep was completed: yes AWV questionnaire completed by patient prior to visit?: yes Date:: 10/15/24 Living arrangements:: (!) (Patient-Rptd) lives alone Patient's Overall Health Status Rating: (!) (Patient-Rptd) poor Typical amount of pain: (Patient-Rptd) some Does pain affect daily life?: (!) (Patient-Rptd) yes Are you currently prescribed opioids?: no  Dietary Habits and Nutritional Risks How many meals a day?: (Patient-Rptd) 2 Eats fruit and vegetables daily?: (Patient-Rptd) yes Most meals are obtained by: (Patient-Rptd) preparing own meals; eating out In the last 2 weeks, have you had any of the following?: none Diabetic:: no  Functional Status Activities of Daily Living (to include ambulation/medication): (Patient-Rptd) Independent Ambulation: Independent with device- listed below Home Assistive Devices/Equipment: Eyeglasses Medication Administration: (Patient-Rptd) Independent Home Management (perform basic housework or laundry): (Patient-Rptd) Independent Manage your own finances?:  (Patient-Rptd) yes Primary transportation is: (Patient-Rptd) driving Concerns about vision?: no *vision screening is required for [facility]* Concerns about hearing?: no  Fall Screening Falls in the past year?: (Patient-Rptd) 0 Number of falls in past year: 0 Was there an injury with Fall?: 0 Fall Risk Category Calculator: 0 Patient Fall Risk Level: Low Fall Risk  Fall Risk Fall risk Follow up: Falls evaluation completed  Home and Transportation Safety: All rugs have non-skid backing?: (!) (Patient-Rptd) no All stairs or steps have railings?: (Patient-Rptd) N/A, no stairs Grab bars in the bathtub or shower?: (!) (Patient-Rptd) no Have non-skid surface in bathtub or shower?: (Patient-Rptd) yes Good home lighting?: (Patient-Rptd) yes Regular seat belt use?: (Patient-Rptd) yes Hospital stays in the last year:: (Patient-Rptd) no  Cognitive Assessment Difficulty concentrating, remembering, or making decisions? : (Patient-Rptd) yes Will 6CIT or Mini Cog be Completed: yes What year is it?: 0 points What month is it?: 0 points Give patient an address phrase to remember (5 components): [address] [place]  About what time is it?: 0 points Count backwards from 20 to 1: 0 points Say the months of the year in reverse: 0 points Repeat the address phrase from earlier: 0 points 6 CIT Score: 0 points  Advance Directives (For Healthcare) Does Patient Have a Medical Advance Directive?: No Would patient like information on creating a medical advance directive?: No - Patient declined  Reviewed/Updated  Reviewed/Updated: Reviewed All (Medical, Surgical, Family, Medications, Allergies, Care Teams, Patient Goals)    Allergies (verified) Patient has no known allergies.   Current Medications (verified) Outpatient Encounter Medications as of 10/16/2024  Medication Sig   albuterol  (VENTOLIN  HFA) 108 (90 Base) MCG/ACT inhaler Inhale 1-2 puffs into the lungs every 6 (six) hours as needed for wheezing  or shortness of breath.   ARIPiprazole  (ABILIFY ) 15 MG tablet Take 15 mg by mouth daily.   cholecalciferol (VITAMIN D3) 25 MCG (1000 UNIT) tablet  Take 5,000 Units by mouth daily.   ferrous sulfate 325 (65 FE) MG tablet Take 325 mg by mouth daily with breakfast.   folic acid (FOLVITE) 1 MG tablet Take 1 mg by mouth daily.   hydrOXYzine (ATARAX) 25 MG tablet Take 25 mg by mouth 3 (three) times daily.   latanoprost (XALATAN) 0.005 % ophthalmic solution INSTILL 1 DROP INTO BOTH EYES NIGHTLY   lisinopril -hydrochlorothiazide  (ZESTORETIC ) 20-25 MG tablet TAKE 1 TABLET BY MOUTH DAILY   rosuvastatin  (CRESTOR ) 5 MG tablet TAKE 1 TABLET BY MOUTH 3 TIMES A WEEK   traZODone  (DESYREL ) 100 MG tablet Take 1 tablet (100 mg total) by mouth at bedtime.   vitamin C (ASCORBIC ACID) 500 MG tablet Take 500 mg by mouth daily.   [DISCONTINUED] metFORMIN  (GLUCOPHAGE ) 500 MG tablet TAKE 1 TABLET BY MOUTH TWICE A DAY WITH A MEAL   [DISCONTINUED] tiZANidine  (ZANAFLEX ) 2 MG tablet Take 1-2 tablets (2-4 mg total) by mouth every 8 (eight) hours as needed.   [DISCONTINUED] traMADol  (ULTRAM ) 50 MG tablet Take 1 tablet (50 mg total) by mouth every 8 (eight) hours as needed for severe pain (pain score 7-10).   No facility-administered encounter medications on file as of 10/16/2024.    History: Past Medical History:  Diagnosis Date   Anemia    Anxiety    Arthritis    Asthma    Bipolar 1 disorder (HCC)    Depression    Diabetes mellitus without complication (HCC)    Dyspnea 2020   with exersion   GERD (gastroesophageal reflux disease)    Glaucoma    Heart murmur    HISTORY OF CLOSED IN 2006   History of chicken pox    History of Helicobacter pylori infection    History of migraine    Hypertension    Obesity    PFO (patent foramen ovale)    Renal disorder    Sleep apnea    does not use cpap   Vitamin D  deficiency    Past Surgical History:  Procedure Laterality Date   BIOPSY  12/04/2018   Procedure: BIOPSY;   Surgeon: Eda Iha, MD;  Location: [facility];  Service: Gastroenterology;;  EGD and Colon   BREAST REDUCTION SURGERY  1986   CHOLECYSTECTOMY N/A 03/23/2019   Procedure: LAPAROSCOPIC CHOLECYSTECTOMY;  Surgeon: Signe Mitzie LABOR, MD;  Location: [facility];  Service: General;  Laterality: N/A;   COLONOSCOPY WITH PROPOFOL  N/A 12/04/2018   Procedure: COLONOSCOPY WITH PROPOFOL ;  Surgeon: Eda Iha, MD;  Location: [facility];  Service: Gastroenterology;  Laterality: N/A;   ESOPHAGOGASTRODUODENOSCOPY (EGD) WITH PROPOFOL  N/A 12/04/2018   Procedure: ESOPHAGOGASTRODUODENOSCOPY (EGD) WITH PROPOFOL ;  Surgeon: Eda Iha, MD;  Location: [facility];  Service: Gastroenterology;  Laterality: N/A;   PATENT FORAMEN OVALE(PFO) CLOSURE  2006   REDUCTION MAMMAPLASTY     TONSILLECTOMY     WISDOM TOOTH EXTRACTION     Family History  Problem Relation Age of Onset   Diabetes Mother    Hypertension Mother    COPD Mother    Obesity Mother    Arthritis Mother    Stroke Father    Hypertension Father    Hypertension Sister    Diabetes Maternal Grandmother    CVA Maternal Grandmother    Diabetes Maternal Grandfather    Colon cancer Paternal Grandmother    Diabetes Paternal Grandfather    Breast cancer Maternal Aunt    Asthma Other    Cancer Other    COPD Other  Esophageal cancer Neg Hx    Rectal cancer Neg Hx    Stomach cancer Neg Hx    Liver disease Neg Hx    Colon polyps Neg Hx    Social History   Occupational History   Occupation: disability  Tobacco Use   Smoking status: Never   Smokeless tobacco: Never  Vaping Use   Vaping status: Never Used  Substance and Sexual Activity   Alcohol use: Yes    Alcohol/week: 1.0 standard drink of alcohol    Types: 1 Standard drinks or equivalent per week    Comment: very rare 1-2 drinks per month   Drug use: Never   Sexual activity: Not Currently    Birth control/protection: None   Tobacco Counseling Counseling given: Not  Answered  SDOH Screenings   Food Insecurity: No Food Insecurity (10/15/2024)  Housing: Low Risk (10/15/2024)  Transportation Needs: No Transportation Needs (10/15/2024)  Utilities: Not At Risk (10/16/2024)  Alcohol Screen: Low Risk (10/15/2024)  Depression (PHQ2-9): Low Risk (10/16/2024)  Financial Resource Strain: Low Risk (10/15/2024)  Physical Activity: Inactive (10/15/2024)  Social Connections: Socially Isolated (10/15/2024)  Stress: Stress Concern Present (10/15/2024)  Tobacco Use: Low Risk (10/16/2024)  Health Literacy: Adequate Health Literacy (10/16/2024)   See flowsheets for full screening details  Depression Screen PHQ 2 & 9 Depression Scale- Over the past 2 weeks, how often have you been bothered by any of the following problems? Little interest or pleasure in doing things: 0 Feeling down, depressed, or hopeless (PHQ Adolescent also includes...irritable): 0 (pt stated today was a good day) PHQ-2 Total Score: 0     Goals Addressed               This Visit's Progress     lose weight (pt-stated)        Weight loss       Patient Stated               Objective:    Today's Vitals   10/16/24 1455  BP: 121/79  Pulse: 91  Weight: 292 lb (132.5 kg)  Height: 5' (1.524 m)   Body mass index is 57.03 kg/m.  Hearing/Vision screen Hearing Screening - Comments:: Pt denies any hearing issues  Vision Screening - Comments:: Wears rx glasses - up to date with routine eye exams with Dr Octavia  Immunizations and Health Maintenance Health Maintenance  Topic Date Due   Pneumococcal Vaccine: 50+ Years (1 of 1 - PCV) Never done   Influenza Vaccine  01/01/2025 (Originally 05/04/2024)   Hepatitis B Vaccines 19-59 Average Risk (1 of 3 - 19+ 3-dose series) 04/12/2025 (Originally 12/31/1986)   Medicare Annual Wellness (AWV)  10/16/2025   Mammogram  11/29/2025   Colonoscopy  11/03/2027   DTaP/Tdap/Td (2 - Td or Tdap) 05/12/2028   Cervical Cancer Screening (HPV/Pap Cotest)  07/06/2028    Hepatitis C Screening  Completed   HIV Screening  Completed   Zoster Vaccines- Shingrix  Completed   HPV VACCINES  Aged Out   Meningococcal B Vaccine  Aged Out   COVID-19 Vaccine  Discontinued        Assessment/Plan:  This is a routine wellness examination for [place].  Patient Care Team: Job Lukes, [place] as PCP - General (Physician Assistant) Eda Iha, MD (Inactive) as Consulting Physician (Gastroenterology) Leila Marsa Jasper, MD as Consulting Physician (Psychiatry) Nicholaus Sherlean CROME, [facility] (Inactive) as Pharmacist (Pharmacist)  I have personally reviewed and noted the following in the patients chart:   Medical and  social history Use of alcohol, tobacco or illicit drugs  Current medications and supplements including opioid prescriptions. Functional ability and status Nutritional status Physical activity Advanced directives List of other physicians Hospitalizations, surgeries, and ER visits in previous 12 months Vitals Screenings to include cognitive, depression, and falls Referrals and appointments  No orders of the defined types were placed in this encounter.  In addition, I have reviewed and discussed with patient certain preventive protocols, quality metrics, and best practice recommendations. A written personalized care plan for preventive services as well as general preventive health recommendations were provided to patient.   Ellouise VEAR Haws, LPN   8/86/7973   Return in about 1 year (around 10/21/2025).  After Visit Summary: (MyChart) Due to this being a telephonic visit, the after visit summary with patients personalized plan was offered to patient via MyChart   Nurse Notes: Patient advised to keep follow-up appointment with PCP (11/06/24) to discuss weight loss and encouraged to follow up with exercise plan    "

## 2024-10-16 NOTE — Patient Instructions (Signed)
 Ms. Roseman,  Thank you for taking the time for your Medicare Wellness Visit. I appreciate your continued commitment to your health goals. Please review the care plan we discussed, and feel free to reach out if I can assist you further.  Please note that Annual Wellness Visits do not include a physical exam. Some assessments may be limited, especially if the visit was conducted virtually. If needed, we may recommend an in-person follow-up with your provider.  Ongoing Care Seeing your primary care provider every 3 to 6 months helps us  monitor your health and provide consistent, personalized care.   Referrals If a referral was made during today's visit and you haven't received any updates within two weeks, please contact the referred provider directly to check on the status.  Recommended Screenings:  Health Maintenance  Topic Date Due   Pneumococcal Vaccine for age over 61 (1 of 1 - PCV) Never done   Flu Shot  Never done   Hepatitis B Vaccine (1 of 3 - 19+ 3-dose series) 04/12/2025*   Medicare Annual Wellness Visit  10/16/2025   Breast Cancer Screening  11/29/2025   Colon Cancer Screening  11/03/2027   DTaP/Tdap/Td vaccine (2 - Td or Tdap) 05/12/2028   Pap with HPV screening  07/06/2028   Hepatitis C Screening  Completed   HIV Screening  Completed   Zoster (Shingles) Vaccine  Completed   HPV Vaccine  Aged Out   Meningitis B Vaccine  Aged Out   COVID-19 Vaccine  Discontinued  *Topic was postponed. The date shown is not the original due date.       10/15/2024   10:42 AM  Advanced Directives  Does Patient Have a Medical Advance Directive? No  Would patient like information on creating a medical advance directive? No - Patient declined    Vision: Annual vision screenings are recommended for early detection of glaucoma, cataracts, and diabetic retinopathy. These exams can also reveal signs of chronic conditions such as diabetes and high blood pressure.  Dental: Annual dental  screenings help detect early signs of oral cancer, gum disease, and other conditions linked to overall health, including heart disease and diabetes.  Please see the attached documents for additional preventive care recommendations.

## 2024-11-06 ENCOUNTER — Encounter: Admitting: Physician Assistant

## 2024-11-08 NOTE — Progress Notes (Signed)
 Tishia Maestre Smullen                                          MRN: [id number]   11/08/2024   The [facility] Quality Team Specialist reviewed this patient medical record for the purposes of chart review for care gap closure. The following were reviewed: chart review for care gap closure-kidney health evaluation for diabetes:eGFR  and uACR.    [facility] Quality Team

## 2024-11-13 ENCOUNTER — Encounter: Admitting: Physician Assistant

## 2024-12-25 ENCOUNTER — Encounter: Admitting: Physician Assistant

## 2025-10-17 ENCOUNTER — Ambulatory Visit
